# Patient Record
Sex: Female | Born: 1965 | Race: Black or African American | Hispanic: No | Marital: Married | Smoking: Never smoker
Health system: Southern US, Community
[De-identification: ages and names within clinical notes are randomized; demographics above are authoritative.]

## PROBLEM LIST (undated history)

## (undated) DIAGNOSIS — M1990 Unspecified osteoarthritis, unspecified site: Secondary | ICD-10-CM

## (undated) DIAGNOSIS — I499 Cardiac arrhythmia, unspecified: Secondary | ICD-10-CM

## (undated) DIAGNOSIS — K589 Irritable bowel syndrome without diarrhea: Secondary | ICD-10-CM

## (undated) DIAGNOSIS — R569 Unspecified convulsions: Secondary | ICD-10-CM

## (undated) DIAGNOSIS — F419 Anxiety disorder, unspecified: Secondary | ICD-10-CM

## (undated) DIAGNOSIS — K219 Gastro-esophageal reflux disease without esophagitis: Secondary | ICD-10-CM

## (undated) DIAGNOSIS — G43909 Migraine, unspecified, not intractable, without status migrainosus: Secondary | ICD-10-CM

## (undated) DIAGNOSIS — F32A Depression, unspecified: Secondary | ICD-10-CM

## (undated) DIAGNOSIS — M797 Fibromyalgia: Secondary | ICD-10-CM

## (undated) DIAGNOSIS — F329 Major depressive disorder, single episode, unspecified: Secondary | ICD-10-CM

## (undated) DIAGNOSIS — F509 Eating disorder, unspecified: Secondary | ICD-10-CM

## (undated) HISTORY — DX: Irritable bowel syndrome, unspecified: K58.9

## (undated) HISTORY — DX: Eating disorder, unspecified: F50.9

## (undated) HISTORY — DX: Fibromyalgia: M79.7

## (undated) HISTORY — DX: Cardiac arrhythmia, unspecified: I49.9

## (undated) HISTORY — DX: Unspecified osteoarthritis, unspecified site: M19.90

## (undated) HISTORY — DX: Major depressive disorder, single episode, unspecified: F32.9

## (undated) HISTORY — DX: Anxiety disorder, unspecified: F41.9

## (undated) HISTORY — DX: Depression, unspecified: F32.A

## (undated) HISTORY — DX: Gastro-esophageal reflux disease without esophagitis: K21.9

## (undated) HISTORY — DX: Migraine, unspecified, not intractable, without status migrainosus: G43.909

## (undated) HISTORY — DX: Unspecified convulsions: R56.9

---

## 1998-01-16 ENCOUNTER — Ambulatory Visit (HOSPITAL_COMMUNITY): Admission: RE | Admit: 1998-01-16 | Discharge: 1998-01-16 | Payer: Self-pay | Admitting: Obstetrics & Gynecology

## 1998-03-23 ENCOUNTER — Ambulatory Visit (HOSPITAL_COMMUNITY): Admission: RE | Admit: 1998-03-23 | Discharge: 1998-03-23 | Payer: Self-pay | Admitting: Obstetrics and Gynecology

## 1998-06-24 ENCOUNTER — Inpatient Hospital Stay (HOSPITAL_COMMUNITY): Admission: AD | Admit: 1998-06-24 | Discharge: 1998-06-27 | Payer: Self-pay | Admitting: Obstetrics and Gynecology

## 1998-10-03 ENCOUNTER — Emergency Department (HOSPITAL_COMMUNITY): Admission: EM | Admit: 1998-10-03 | Discharge: 1998-10-03 | Payer: Self-pay | Admitting: Emergency Medicine

## 1998-10-03 ENCOUNTER — Encounter: Payer: Self-pay | Admitting: Emergency Medicine

## 1998-12-05 ENCOUNTER — Other Ambulatory Visit: Admission: RE | Admit: 1998-12-05 | Discharge: 1998-12-05 | Payer: Self-pay | Admitting: *Deleted

## 1999-04-01 ENCOUNTER — Other Ambulatory Visit: Admission: RE | Admit: 1999-04-01 | Discharge: 1999-04-01 | Payer: Self-pay | Admitting: *Deleted

## 1999-07-10 ENCOUNTER — Other Ambulatory Visit: Admission: RE | Admit: 1999-07-10 | Discharge: 1999-07-10 | Payer: Self-pay | Admitting: *Deleted

## 1999-07-12 ENCOUNTER — Other Ambulatory Visit: Admission: RE | Admit: 1999-07-12 | Discharge: 1999-07-12 | Payer: Self-pay | Admitting: *Deleted

## 2000-02-13 ENCOUNTER — Other Ambulatory Visit: Admission: RE | Admit: 2000-02-13 | Discharge: 2000-02-13 | Payer: Self-pay | Admitting: *Deleted

## 2000-12-21 ENCOUNTER — Other Ambulatory Visit: Admission: RE | Admit: 2000-12-21 | Discharge: 2000-12-21 | Payer: Self-pay | Admitting: *Deleted

## 2001-09-24 ENCOUNTER — Other Ambulatory Visit: Admission: RE | Admit: 2001-09-24 | Discharge: 2001-09-24 | Payer: Self-pay | Admitting: *Deleted

## 2003-05-29 ENCOUNTER — Encounter: Admission: RE | Admit: 2003-05-29 | Discharge: 2003-05-29 | Payer: Self-pay | Admitting: Chiropractic Medicine

## 2003-05-29 ENCOUNTER — Encounter: Payer: Self-pay | Admitting: Chiropractic Medicine

## 2004-08-13 ENCOUNTER — Emergency Department (HOSPITAL_COMMUNITY): Admission: EM | Admit: 2004-08-13 | Discharge: 2004-08-13 | Payer: Self-pay | Admitting: Emergency Medicine

## 2005-05-29 ENCOUNTER — Other Ambulatory Visit: Admission: RE | Admit: 2005-05-29 | Discharge: 2005-05-29 | Payer: Self-pay | Admitting: Obstetrics and Gynecology

## 2005-06-03 ENCOUNTER — Ambulatory Visit (HOSPITAL_COMMUNITY): Admission: RE | Admit: 2005-06-03 | Discharge: 2005-06-03 | Payer: Self-pay | Admitting: Obstetrics and Gynecology

## 2006-05-28 ENCOUNTER — Encounter: Admission: RE | Admit: 2006-05-28 | Discharge: 2006-05-28 | Payer: Self-pay | Admitting: Internal Medicine

## 2006-06-09 ENCOUNTER — Ambulatory Visit (HOSPITAL_COMMUNITY): Admission: RE | Admit: 2006-06-09 | Discharge: 2006-06-09 | Payer: Self-pay | Admitting: Obstetrics and Gynecology

## 2006-08-28 ENCOUNTER — Emergency Department (HOSPITAL_COMMUNITY): Admission: EM | Admit: 2006-08-28 | Discharge: 2006-08-28 | Payer: Self-pay | Admitting: Emergency Medicine

## 2006-08-29 ENCOUNTER — Ambulatory Visit: Payer: Self-pay | Admitting: *Deleted

## 2006-08-29 ENCOUNTER — Inpatient Hospital Stay (HOSPITAL_COMMUNITY): Admission: AD | Admit: 2006-08-29 | Discharge: 2006-08-31 | Payer: Self-pay | Admitting: *Deleted

## 2006-12-03 ENCOUNTER — Other Ambulatory Visit: Admission: RE | Admit: 2006-12-03 | Discharge: 2006-12-03 | Payer: Self-pay | Admitting: Obstetrics and Gynecology

## 2007-01-26 ENCOUNTER — Ambulatory Visit (HOSPITAL_COMMUNITY): Admission: RE | Admit: 2007-01-26 | Discharge: 2007-01-26 | Payer: Self-pay | Admitting: Obstetrics and Gynecology

## 2007-03-25 ENCOUNTER — Ambulatory Visit (HOSPITAL_COMMUNITY): Admission: RE | Admit: 2007-03-25 | Discharge: 2007-03-25 | Payer: Self-pay | Admitting: Obstetrics and Gynecology

## 2007-07-01 ENCOUNTER — Inpatient Hospital Stay (HOSPITAL_COMMUNITY): Admission: RE | Admit: 2007-07-01 | Discharge: 2007-07-03 | Payer: Self-pay | Admitting: Obstetrics and Gynecology

## 2007-12-06 ENCOUNTER — Other Ambulatory Visit: Admission: RE | Admit: 2007-12-06 | Discharge: 2007-12-06 | Payer: Self-pay | Admitting: Obstetrics and Gynecology

## 2009-03-07 ENCOUNTER — Encounter: Admission: RE | Admit: 2009-03-07 | Discharge: 2009-03-07 | Payer: Self-pay | Admitting: Family Medicine

## 2009-07-04 ENCOUNTER — Other Ambulatory Visit: Admission: RE | Admit: 2009-07-04 | Discharge: 2009-07-04 | Payer: Self-pay | Admitting: Family Medicine

## 2009-07-05 ENCOUNTER — Encounter: Admission: RE | Admit: 2009-07-05 | Discharge: 2009-07-05 | Payer: Self-pay | Admitting: Family Medicine

## 2010-07-15 ENCOUNTER — Ambulatory Visit (HOSPITAL_COMMUNITY): Admission: RE | Admit: 2010-07-15 | Discharge: 2010-07-15 | Payer: Self-pay | Admitting: Family Medicine

## 2010-07-15 ENCOUNTER — Other Ambulatory Visit: Admission: RE | Admit: 2010-07-15 | Discharge: 2010-07-15 | Payer: Self-pay | Admitting: Family Medicine

## 2010-12-29 ENCOUNTER — Encounter: Payer: Self-pay | Admitting: Obstetrics and Gynecology

## 2011-02-27 ENCOUNTER — Emergency Department (HOSPITAL_COMMUNITY): Payer: BC Managed Care – PPO

## 2011-02-27 ENCOUNTER — Emergency Department (HOSPITAL_COMMUNITY)
Admission: EM | Admit: 2011-02-27 | Discharge: 2011-02-28 | Disposition: A | Discharge status: Home / Self Care | Payer: BC Managed Care – PPO | Attending: Emergency Medicine | Admitting: Emergency Medicine

## 2011-02-27 DIAGNOSIS — R071 Chest pain on breathing: Secondary | ICD-10-CM | POA: Insufficient documentation

## 2011-02-27 DIAGNOSIS — M542 Cervicalgia: Secondary | ICD-10-CM | POA: Insufficient documentation

## 2011-02-27 DIAGNOSIS — G8929 Other chronic pain: Secondary | ICD-10-CM | POA: Insufficient documentation

## 2011-02-27 DIAGNOSIS — M549 Dorsalgia, unspecified: Secondary | ICD-10-CM | POA: Insufficient documentation

## 2011-02-27 LAB — BASIC METABOLIC PANEL
BUN: 11 mg/dL (ref 6–23)
CO2: 28 mEq/L (ref 19–32)
Chloride: 105 mEq/L (ref 96–112)
Creatinine, Ser: 0.67 mg/dL (ref 0.4–1.2)
GFR calc non Af Amer: >60 mL/min (ref >60)
Glucose, Bld: 112 mg/dL — ABNORMAL HIGH (ref 70–99)
Potassium: 3.8 mEq/L (ref 3.5–5.1)
Sodium: 137 mEq/L (ref 135–145)

## 2011-02-27 LAB — POCT CARDIAC MARKERS

## 2011-04-22 NOTE — H&P (Signed)
Suzanne Singh, [name]                ACCOUNT NO.:  [id number]   MEDICAL RECORD NO.:  [id number]          PATIENT TYPE:  OUT   LOCATION:  ULT                           [facility]ILITY[facility]  [facility]   PHYSICIAN:  Charles A. Delcambre, MDDATE OF BIRTH:  09-24-1966   DATE OF ADMISSION:  DATE OF DISCHARGE:                              HISTORY & PHYSICAL   PREADMISSION HISTORY AND PHYSICAL   This patient is to be admitted for induction of labor at 40 weeks and 6  days as I will be out of town the next three days and that would make  her 10 days overdue, which I would recommend, but she wishes to proceed  with induction on this day and I think this is reasonable.  She gives  informed consent.  She is a 45 year old, gravida 2, para 1-0-0-1, [facility]  June 25, 2007.  Pregnancy complicated by the advanced maternal age,  Rubella nonimmune, anemia, hyperemesis, continued anemia, fibromyalgia,  and chronic fatigue.  She gives informed consent for induction.   PAST MEDICAL HISTORY:  1. Fibromyalgia.  2. Chronic fatigue.   PAST SURGICAL HISTORY:  SVD x1.   PRESENT MEDICATIONS:  Nexium 40 mg per day for GERD associated with the  pregnancy, and prenatal vitamins with iron.   ALLERGIES:  No known drug allergies.   SOCIAL HISTORY:  No tobacco, ethanol, or drug use, or STD exposure in  the past.  She is married and in a monogamous relationship with her  husband.   FAMILY HISTORY:  Father deceased of metastatic lung cancer of unknown  primary with hypertension and diabetes as well, mother with  hypertension, four siblings good health, one sister fair health, not  stated what her problem is.   REVIEW OF SYSTEMS:  Denies fever or chills, rashes, lesions, headaches,  dizziness, seasonal allergies, chest pain, shortness of breath,  wheezing, diarrhea, constipation, bleeding, melena, hematochezia,  urgency, frequency, dysuria, incontinence other than that associated  with the pregnancy, no hematuria, she does have  galactorrhea developing,  no emotional changes, but a somewhat flat affect as I have known her in  the past as well.   PHYSICAL EXAMINATION:  GENERAL:  Alert and oriented x3.  VITAL SIGNS:  Weight 211, blood pressure 100/70, respirations 18, pulse  90.  Fundal height is 37 today at 39 weeks and 4 days.  HEENT:  Grossly within normal limits.  NECK:  Supple without thyromegaly or adenopathy.  LUNGS:  Clear bilaterally.  HEART:  Regular rate and rhythm, 2/6 systolic ejection murmur at the  left sternal border.  BREASTS:  No masses, tenderness, discharge, skin or nipple changes  bilaterally.  ABDOMEN:  Gravid as noted above.  PELVIC EXAM:  Cervix posterior, 2 to 3 cm dilated, 50% effaced, soft,  minus one, vertex intact.  EXTREMITIES:  Mild edema bilaterally after just getting off work.  She  had significant edema when she was up on her feet working, but this is  quite a bit resolved.   ASSESSMENT:  1. Intrauterine pregnancy at 40 weeks, 6 days.  2. Advanced maternal age.  3. Fibromyalgia.  4. Chronic fatigue.  5. History of anemia, hemoglobin 11.5 today.  6. History of seizure prior to this pregnancy felt to be caused by a      number of medications she was on.  She had a complete workup at      [facility], which was negative, and was placed on no medications.   PLAN:  1. Pitocin/AROM induction as scheduled at 40 weeks and 6 days.  2. Pregnancy:  She is improving and will follow up as directed.   PRENATAL LABORATORY DATA:  Blood type O-positive, antibody screen  negative, sickle cell trait negative, VDRL nonreactive, Rubella titer  nonimmune, hepatitis B surface antigen negative, HIV negative, TSH  normal, Pap negative, GC negative, Chlamydia negative in December of  2007, one-hour Glucola 111.  She declined a quad screen or a first  trimester screen.  Hemoglobin 9.4 at that time, got onto the iron.  She  declined an RPR, GC, Chlamydia, or HIV testing further in the pregnancy,  and  Group B Strep was negative at 36 weeks.      Charles A. Sydnee Cabal, MD  Electronically Signed     CAD/MEDQ  D:  06/22/2007  T:  06/22/2007  Job:  [id number]
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
## 2011-04-25 NOTE — Discharge Summary (Signed)
[name], Singh NO.:  [id number]   MEDICAL RECORD NO.:  [id number]          PATIENT TYPE:  IPS   LOCATION:  0301                          [facility]ILITY[facility]   PHYSICIAN:  Jasmine Pang, M.D. DATE OF BIRTH:  1966-09-26   DATE OF ADMISSION:  08/29/2006  DATE OF DISCHARGE:  08/31/2006                                 DISCHARGE SUMMARY   IDENTIFYING INFORMATION:  This is a 45 year old married African American  female who was admitted on a voluntary basis on August 29, 2006.   HISTORY OF PRESENT ILLNESS:  The patient presented to the [facility] ED in  the company of her husband and sister.  This was a little after midnight  Saturday.  She stated that she was there for exhaustion.  She had not slept  in five days.  She was alert but her orientation was questionable.  The  family stated she stopped taking her medications.  She denied any suicidal  ideation.  She was noted to have 7-10 wbc in her urine.  She was treated  with IV Cipro.  Her glucose was elevated to 149.  Her urine drug screen was  negative.  Her alcohol level was less than 5.  Her TSH was pending.  While  she was in the emergency room, she was noted to be twitching.  This was felt  to be representative of anxiety and pseudoseizures.  She was treated with IV  Ativan.  She had a complete workup including a CT scan and everything was  negative.  The patient has no prior inpatient treatment.  She has been  treated for a number of years now as an outpatient basis through [facility].  Dr. Audrie Lia was her physician but he recently retired.  Most  currently, she has been on Prozac 60 mg p.o. q.d. which she stopped taking  several days ago because she felt this was making her feel worse.  She  admits to taking a couple glasses of wine every now and then and  occasionally uses Tylenol PM to help sleep but there is no drug abuse.  She  also has fibromyalgia and has been on prednisone in the past for this  but  not for the last several months.   PHYSICAL EXAMINATION:  As already stated, the patient was found to have a  UTI.  Her physical exam was done in the [facility] ED and otherwise was  normal.   LABORATORY DATA:  See history of present illness.  These were done in the  [facility] ED.   HOSPITAL COURSE:  Upon admission, the patient was started on Ambien 10 mg  p.o. q.h.s. p.r.n., Zyprexa Zydis 5 mg p.o. q.6h. p.r.n., Ativan 2 mg p.o.  or IM q.6h. p.r.n. anxiety and Cipro 500 mg p.o. b.i.d. x5 more doses due to  her UTI.  The patient tolerated these medications well with no significant  side effects.  Her Prozac was not restarted because the patient did not want  to be treated with this anymore.  She felt this had possibly caused  her  seizure.  She was started on August 30, 2006 on Remeron 15 mg p.o. q.h.s.  The patient did well on the unit and was able to participate in unit  therapeutic groups and activities.  She was not suicidal or homicidal.  She  still felt the seizure was not due to psychiatric reasons but due to her  Prozac.  Her mental status exam had improved markedly.  The patient was less  depressed.  Her affect was wider range.  She was not suicidal or homicidal.  No self-injurious behavior.  No auditory or visual hallucinations.  No  paranoia or delusions.  Thoughts were logical and goal-directed.  Thought  content no predominant theme.  Cognitive exam was grossly within normal  limits.  The patient was very bright Engineer, site.   DISCHARGE DIAGNOSES:  AXIS I:  Mood disorder not otherwise specified.  AXIS II:  Deferred.  AXIS III:  Pseudoseizures, urinary tract infection, fibromyalgia,  questionable history for bleeding and migraines.  AXIS IV:  Moderate (stressful job).  AXIS V:  GAF upon discharge 58; GAF upon admission 37; GAF highest past year  75-80.   ACTIVITY/DIET:  There are no specific activity level or dietary  restrictions.   DISCHARGE MEDICATIONS:   Remeron 15 mg p.o. q.h.s.   POST-HOSPITAL CARE PLANS:  The patient will see Hulda Marin on  Thursday September 03, 2006 at 2 p.m. for therapy.  She will see Dr. Lolly Mustache  on Wednesday, October 07, 2006 at 9 a.m.      Jasmine Pang, M.D.  Electronically Signed     BHS/MEDQ  D:  08/31/2006  T:  09/01/2006  Job:  [id number]

## 2011-06-09 ENCOUNTER — Other Ambulatory Visit (HOSPITAL_COMMUNITY): Payer: Self-pay | Admitting: Family Medicine

## 2011-06-09 DIAGNOSIS — Z1231 Encounter for screening mammogram for malignant neoplasm of breast: Secondary | ICD-10-CM

## 2011-07-17 ENCOUNTER — Ambulatory Visit (HOSPITAL_COMMUNITY)
Admission: RE | Admit: 2011-07-17 | Discharge: 2011-07-17 | Disposition: A | Discharge status: Home / Self Care | Payer: BC Managed Care – PPO | Source: Ambulatory Visit | Attending: Family Medicine | Admitting: Family Medicine

## 2011-07-17 DIAGNOSIS — Z1231 Encounter for screening mammogram for malignant neoplasm of breast: Secondary | ICD-10-CM | POA: Insufficient documentation

## 2011-07-21 ENCOUNTER — Other Ambulatory Visit (HOSPITAL_COMMUNITY)
Admission: RE | Admit: 2011-07-21 | Discharge: 2011-07-21 | Disposition: A | Discharge status: Home / Self Care | Payer: BC Managed Care – PPO | Source: Ambulatory Visit | Attending: Family Medicine | Admitting: Family Medicine

## 2011-07-21 ENCOUNTER — Other Ambulatory Visit: Payer: Self-pay | Admitting: Family Medicine

## 2011-07-21 DIAGNOSIS — Z01419 Encounter for gynecological examination (general) (routine) without abnormal findings: Secondary | ICD-10-CM | POA: Insufficient documentation

## 2011-09-22 LAB — CBC
HCT: 31.8 — ABNORMAL LOW
Hemoglobin: 10.6 — ABNORMAL LOW
MCHC: 33.4
MCV: 90.2
RBC: 3.03 — ABNORMAL LOW
RBC: 3.59 — ABNORMAL LOW

## 2012-05-19 ENCOUNTER — Ambulatory Visit: Payer: BC Managed Care – PPO | Admitting: Family Medicine

## 2012-06-09 ENCOUNTER — Other Ambulatory Visit: Payer: Self-pay | Admitting: Family Medicine

## 2012-06-09 DIAGNOSIS — Z1231 Encounter for screening mammogram for malignant neoplasm of breast: Secondary | ICD-10-CM

## 2012-07-19 ENCOUNTER — Ambulatory Visit (HOSPITAL_COMMUNITY)
Admission: RE | Admit: 2012-07-19 | Discharge: 2012-07-19 | Disposition: A | Discharge status: Home / Self Care | Payer: BC Managed Care – PPO | Source: Ambulatory Visit | Attending: Family Medicine | Admitting: Family Medicine

## 2012-07-19 DIAGNOSIS — Z1231 Encounter for screening mammogram for malignant neoplasm of breast: Secondary | ICD-10-CM

## 2012-07-22 ENCOUNTER — Ambulatory Visit (INDEPENDENT_AMBULATORY_CARE_PROVIDER_SITE_OTHER): Payer: BC Managed Care – PPO | Admitting: Family Medicine

## 2012-07-22 ENCOUNTER — Encounter: Payer: Self-pay | Admitting: *Deleted

## 2012-07-22 ENCOUNTER — Encounter: Payer: Self-pay | Admitting: Family Medicine

## 2012-07-22 ENCOUNTER — Other Ambulatory Visit (HOSPITAL_COMMUNITY)
Admission: RE | Admit: 2012-07-22 | Discharge: 2012-07-22 | Disposition: A | Discharge status: Home / Self Care | Payer: BC Managed Care – PPO | Source: Ambulatory Visit | Attending: Family Medicine | Admitting: Family Medicine

## 2012-07-22 VITALS — BP 108/78 | HR 85 | Temp 98.3°F | Ht 68.5 in | Wt 179.0 lb

## 2012-07-22 DIAGNOSIS — F418 Other specified anxiety disorders: Secondary | ICD-10-CM | POA: Insufficient documentation

## 2012-07-22 DIAGNOSIS — K219 Gastro-esophageal reflux disease without esophagitis: Secondary | ICD-10-CM

## 2012-07-22 DIAGNOSIS — Z124 Encounter for screening for malignant neoplasm of cervix: Secondary | ICD-10-CM | POA: Insufficient documentation

## 2012-07-22 DIAGNOSIS — IMO0001 Reserved for inherently not codable concepts without codable children: Secondary | ICD-10-CM

## 2012-07-22 DIAGNOSIS — Z01419 Encounter for gynecological examination (general) (routine) without abnormal findings: Secondary | ICD-10-CM | POA: Insufficient documentation

## 2012-07-22 DIAGNOSIS — Z0000 Encounter for general adult medical examination without abnormal findings: Secondary | ICD-10-CM | POA: Insufficient documentation

## 2012-07-22 DIAGNOSIS — F509 Eating disorder, unspecified: Secondary | ICD-10-CM | POA: Insufficient documentation

## 2012-07-22 DIAGNOSIS — F419 Anxiety disorder, unspecified: Secondary | ICD-10-CM

## 2012-07-22 DIAGNOSIS — M797 Fibromyalgia: Secondary | ICD-10-CM | POA: Insufficient documentation

## 2012-07-22 DIAGNOSIS — F341 Dysthymic disorder: Secondary | ICD-10-CM

## 2012-07-22 LAB — CBC WITH DIFFERENTIAL/PLATELET
Eosinophils Relative: 1.7 % (ref 0.0–5.0)
HCT: 36.2 % (ref 36.0–46.0)
Lymphocytes Relative: 40.6 % (ref 12.0–46.0)
Lymphs Abs: 1.3 10*3/uL (ref 0.7–4.0)
Monocytes Absolute: 0.4 10*3/uL (ref 0.1–1.0)
Neutro Abs: 1.5 10*3/uL (ref 1.4–7.7)
Neutrophils Relative %: 45 % (ref 43.0–77.0)

## 2012-07-22 LAB — BASIC METABOLIC PANEL
CO2: 26 mEq/L (ref 19–32)
Calcium: 9 mg/dL (ref 8.4–10.5)
Chloride: 107 mEq/L (ref 96–112)
Creatinine, Ser: 0.5 mg/dL (ref 0.4–1.2)
GFR: 157.33 mL/min (ref >60.00)
Glucose, Bld: 84 mg/dL (ref 70–99)
Potassium: 3.9 mEq/L (ref 3.5–5.1)

## 2012-07-22 LAB — HEPATIC FUNCTION PANEL
ALT: 14 U/L (ref 0–35)
AST: 22 U/L (ref 0–37)
Total Bilirubin: 0.4 mg/dL (ref 0.3–1.2)
Total Protein: 7.3 g/dL (ref 6.0–8.3)

## 2012-07-22 LAB — LIPID PANEL
LDL Cholesterol: 88 mg/dL (ref 0–99)
Total CHOL/HDL Ratio: 2
VLDL: 10.8 mg/dL (ref 0.0–40.0)

## 2012-07-22 MED ORDER — OMEPRAZOLE 40 MG PO CPDR: 40.0000 mg | DELAYED_RELEASE_CAPSULE | Freq: Every day | ORAL | Status: DC

## 2012-07-22 NOTE — Patient Instructions (Addendum)
We'll notify you of your lab results Start the Omeprazole daily for the reflux Continue therapy- call if you have increased difficulty w/ depression or anxiety Start Yoga or water aerobics as both exercise and stress relief Call with any questions or concerns Welcome!  We're glad to have you! Hang in there!

## 2012-07-22 NOTE — Progress Notes (Signed)
  Subjective:    Patient ID: Suzanne Singh, female    DOB: 1966-07-18, 46 y.o.   MRN: [id number]  HPI New to establish.  Previous MD- Wynelle Link.  UTD on mammogram.  Anxiety/depression- chronic problem, has been told she has fibromyalgia and is currently seeing Integrative therapies.  Getting Acupuncture, has been in PT several times w/out relief.  In counseling.  Has hx of eating disorder (anorexia/bulemia) and fears recurrent sxs due to recent separation.  Pt doesn't 'see much difference' since starting the Zoloft.  Is taking 1.5 tabs of Klonopin nightly- she is able to fall asleep but difficulty staying asleep.   Review of Systems Patient reports no vision/ hearing changes, adenopathy, fever, weight change,  persistant/recurrent hoarseness , swallowing issues, chest pain, palpitations, edema, persistant/recurrent cough, hemoptysis, dyspnea (rest/exertional/paroxysmal nocturnal), gastrointestinal bleeding (melena, rectal bleeding), abdominal pain, bowel changes, GU symptoms (dysuria, hematuria, incontinence), Gyn symptoms (abnormal  bleeding, pain),  syncope, focal weakness, memory loss, numbness & tingling, skin/hair/nail changes, abnormal bruising or bleeding.   + GERD    Objective:   Physical Exam  General Appearance:    Alert, cooperative, no distress, appears stated age  Head:    Normocephalic, without obvious abnormality, atraumatic  Eyes:    PERRL, conjunctiva/corneas clear, EOM's intact, fundi    benign, both eyes  Ears:    Normal TM's and external ear canals, both ears  Nose:   Nares normal, septum midline, mucosa normal, no drainage    or sinus tenderness  Throat:   Lips, mucosa, and tongue normal; teeth and gums normal  Neck:   Supple, symmetrical, trachea midline, no adenopathy;    Thyroid: no enlargement/tenderness/nodules  Back:     Symmetric, no curvature, ROM normal, no CVA tenderness  Lungs:     Clear to auscultation bilaterally, respirations unlabored  Chest Wall:    No  tenderness or deformity   Heart:    Regular rate and rhythm, S1 and S2 normal, no murmur, rub   or gallop  Breast Exam:    No tenderness, masses, or nipple abnormality  Abdomen:     Soft, non-tender, bowel sounds active all four quadrants,    no masses, no organomegaly  Genitalia:    External genitalia normal, cervix normal in appearance, no CMT, uterus in normal size and position, adnexa w/out mass or tenderness, mucosa pink and moist, no lesions or discharge present  Rectal:    Normal external appearance  Extremities:   Extremities normal, atraumatic, no cyanosis or edema  Pulses:   2+ and symmetric all extremities  Skin:   Skin color, texture, turgor normal, no rashes or lesions  Lymph nodes:   Cervical, supraclavicular, and axillary nodes normal  Neurologic:   CNII-XII intact, normal strength, sensation and reflexes    throughout          Assessment & Plan:

## 2012-07-26 ENCOUNTER — Encounter: Payer: Self-pay | Admitting: *Deleted

## 2012-07-26 LAB — VITAMIN D 1,25 DIHYDROXY
Vitamin D2 1, 25 (OH)2: <8 pg/mL
Vitamin D3 1, 25 (OH)2: 79 pg/mL

## 2012-07-27 ENCOUNTER — Encounter: Payer: Self-pay | Admitting: *Deleted

## 2012-08-01 NOTE — Assessment & Plan Note (Signed)
Pt's PE WNL.  UTD on mammo.  Check labs.  Anticipatory guidance provided.  

## 2012-08-01 NOTE — Assessment & Plan Note (Signed)
Hx of this.  Pt fears recurrence w/ recent separation.  In counseling.  Will follow.

## 2012-08-01 NOTE — Assessment & Plan Note (Signed)
New to provider, chronic for pt.  sxs do not seem well controlled.  In therapy.  On Zoloft.  Discussed seeing psychiatrist vs adjusting meds.  Pt will think about this and get back to me.  Will follow closely.

## 2012-08-01 NOTE — Assessment & Plan Note (Signed)
New.  Pt in difficult cycle of pain, depression, poor sleep.  Stressed importance of regular exercise, healthy eating habits, adequate sleep, and control of depression/anxiety.  Pt in agreement.  Will follow.

## 2012-08-01 NOTE — Assessment & Plan Note (Signed)
Pap collected. 

## 2012-08-01 NOTE — Assessment & Plan Note (Signed)
New.  Pt's sxs not well controlled.  Start PPI.

## 2012-08-18 ENCOUNTER — Ambulatory Visit: Payer: BC Managed Care – PPO | Admitting: Family Medicine

## 2012-10-08 ENCOUNTER — Ambulatory Visit: Payer: BC Managed Care – PPO | Admitting: Family Medicine

## 2012-10-27 ENCOUNTER — Encounter: Payer: Self-pay | Admitting: Family Medicine

## 2012-10-27 ENCOUNTER — Telehealth: Payer: Self-pay | Admitting: Family Medicine

## 2012-10-27 NOTE — Telephone Encounter (Signed)
1-time waiver for No show fee DOS 11.1.13 OK NF

## 2012-10-27 NOTE — Telephone Encounter (Signed)
Message copied by Verner Chol on Wed Oct 27, 2012 11:00 AM ------      Message from: Elwin Sleight      Created: Wed Oct 27, 2012 10:59 AM      Regarding: RE: no show fee      Contact: [phone]       Since no previous problems, please do the 1 time waiver.  Thanks, Harriett Sine      ----- Message -----         From: Theodis Sato McDaniels         Sent: 10/25/2012   3:52 PM           To: Elwin Sleight      Subject: no show fee                                              Pt called stated she cancelled her appt but was no showed anyway. Spoke with Scheduler and she did not cancel the one she was no showed for she did cancel the other one. Can I give pt a 1-time waiver, with notice all future appts are subject to no show fee

## 2012-11-02 ENCOUNTER — Telehealth: Payer: Self-pay | Admitting: Family Medicine

## 2012-11-02 NOTE — Telephone Encounter (Signed)
Called pt 857am 11.26.13 lmom

## 2012-11-02 NOTE — Telephone Encounter (Signed)
Message copied by Verner Chol on Tue Nov 02, 2012  8:56 AM ------      Message from: Marshell Garfinkel      Created: Fri Oct 29, 2012  2:47 PM      Contact: [phone]       Patient states someone was to call her this week regarding her no-show fee. She can be reached on Monday after 3 pm at [phone].

## 2012-12-06 ENCOUNTER — Encounter: Payer: Self-pay | Admitting: Internal Medicine

## 2012-12-06 ENCOUNTER — Encounter: Payer: Self-pay | Admitting: Family Medicine

## 2012-12-06 ENCOUNTER — Ambulatory Visit (INDEPENDENT_AMBULATORY_CARE_PROVIDER_SITE_OTHER): Payer: BC Managed Care – PPO | Admitting: Family Medicine

## 2012-12-06 VITALS — BP 120/80 | HR 71 | Temp 98.2°F | Ht 68.5 in | Wt 186.2 lb

## 2012-12-06 DIAGNOSIS — K589 Irritable bowel syndrome without diarrhea: Secondary | ICD-10-CM

## 2012-12-06 DIAGNOSIS — F341 Dysthymic disorder: Secondary | ICD-10-CM

## 2012-12-06 DIAGNOSIS — R1013 Epigastric pain: Secondary | ICD-10-CM | POA: Insufficient documentation

## 2012-12-06 DIAGNOSIS — Z780 Asymptomatic menopausal state: Secondary | ICD-10-CM | POA: Insufficient documentation

## 2012-12-06 DIAGNOSIS — F419 Anxiety disorder, unspecified: Secondary | ICD-10-CM

## 2012-12-06 DIAGNOSIS — N951 Menopausal and female climacteric states: Secondary | ICD-10-CM | POA: Insufficient documentation

## 2012-12-06 DIAGNOSIS — Z800 Family history of malignant neoplasm of digestive organs: Secondary | ICD-10-CM

## 2012-12-06 DIAGNOSIS — R5383 Other fatigue: Secondary | ICD-10-CM | POA: Insufficient documentation

## 2012-12-06 LAB — BASIC METABOLIC PANEL
CO2: 27 mEq/L (ref 19–32)
Chloride: 104 mEq/L (ref 96–112)
Creatinine, Ser: 0.7 mg/dL (ref 0.4–1.2)
Potassium: 3.9 mEq/L (ref 3.5–5.1)

## 2012-12-06 LAB — HEPATIC FUNCTION PANEL
ALT: 17 U/L (ref 0–35)
AST: 20 U/L (ref 0–37)
Alkaline Phosphatase: 70 U/L (ref 39–117)
Bilirubin, Direct: 0 mg/dL (ref 0.0–0.3)
Total Protein: 7.7 g/dL (ref 6.0–8.3)

## 2012-12-06 LAB — AMYLASE: Amylase: 82 U/L (ref 27–131)

## 2012-12-06 MED ORDER — DICYCLOMINE HCL 20 MG PO TABS: 20.0000 mg | ORAL_TABLET | Freq: Four times a day (QID) | ORAL | Status: DC

## 2012-12-06 NOTE — Patient Instructions (Addendum)
We'll call you with your GI appt Start the Bentyl as needed for abd pain/spasm We'll notify you of your lab results Make sure you are taking the Omeprazole daily to decrease acid Consider adding Black Cohosh or Estruven for the hot flashes Please call if symptoms change or fail to improve Call with any questions or concerns Hang in there! Happy New Year!!!

## 2012-12-06 NOTE — Progress Notes (Signed)
  Subjective:    Patient ID: Suzanne Singh, female    DOB: 1966/04/08, 46 y.o.   MRN: [id number]  HPI abd pain- 'churning, awful gas'.  Alternating diarrhea and constipation.  + family hx of colon cancer- father.  Pt feels sxs are more constipation than diarrhea.  + GERD w/ stress.  Will take prilosec prn.  Pain is epigastric.  + nausea, vomiting- last vomited 2 days ago.  Vomiting occurs after eating.  Pain is worse w/ food.  Increased stress recently.  Night sweats- occuring 'every other night'.  sxs started 3 months ago.  No weight loss, no cough.  Pt questioning if it is related to Zoloft and/or menopause.  Sweats are not soaking.  Periods are becoming irregular in duration of menses.  Pt having hot flashes during the day.  + palpitations, fatigue, achy- hx of anxiety and fibromyalgia.   Review of Systems For ROS see HPI     Objective:   Physical Exam  Vitals reviewed. Constitutional: She is oriented to person, place, and time. She appears well-developed and well-nourished. No distress.  HENT:  Head: Normocephalic and atraumatic.  Eyes: Conjunctivae normal and EOM are normal. Pupils are equal, round, and reactive to light.  Neck: Normal range of motion. Neck supple. No thyromegaly present.  Cardiovascular: Normal rate, regular rhythm, normal heart sounds and intact distal pulses.   No murmur heard. Pulmonary/Chest: Effort normal and breath sounds normal. No respiratory distress.  Abdominal: Soft. She exhibits no distension. There is no tenderness. There is no rebound and no guarding.  Musculoskeletal: She exhibits no edema.  Lymphadenopathy:    She has no cervical adenopathy.  Neurological: She is alert and oriented to person, place, and time.  Skin: Skin is warm and dry.  Psychiatric: She has a normal mood and affect. Her behavior is normal.          Assessment & Plan:

## 2012-12-07 LAB — CBC WITH DIFFERENTIAL/PLATELET
Basophils Absolute: 0 10*3/uL (ref 0.0–0.1)
Hemoglobin: 12.1 g/dL (ref 12.0–15.0)
Lymphocytes Relative: 34.6 % (ref 12.0–46.0)
Monocytes Relative: 11.4 % (ref 3.0–12.0)
Neutro Abs: 2.3 10*3/uL (ref 1.4–7.7)
RBC: 4.14 Mil/uL (ref 3.87–5.11)
RDW: 13.6 % (ref 11.5–14.6)

## 2012-12-08 HISTORY — PX: COLONOSCOPY: SHX174

## 2012-12-08 NOTE — Assessment & Plan Note (Signed)
New.  Suspect this is the cause of pt's alternating diarrhea and constipation.  Will start bentyl prn for abd pain.  Refer to GI.  Reviewed supportive care and red flags that should prompt return.  Pt expressed understanding and is in agreement w/ plan.

## 2012-12-08 NOTE — Assessment & Plan Note (Signed)
Deteriorated.  Pt on SSRI but still extremely anxious.  Encouraged counseling.  Suspect this is contributing to pt's IBS.  Will follow.

## 2012-12-08 NOTE — Assessment & Plan Note (Signed)
New.  Already on SSRI.  Add black cohosh or OTC estroven.  Will follow.

## 2012-12-08 NOTE — Assessment & Plan Note (Signed)
New.  Suspect this is due to undertreated GERD.  Start daily PPI.  Check for h pylori, pancreatitis, liver abnormality.  Refer to GI.  Reviewed supportive care and red flags that should prompt return.  Pt expressed understanding and is in agreement w/ plan.

## 2012-12-08 NOTE — Assessment & Plan Note (Signed)
New.  Refer to GI for evaluation. 

## 2012-12-08 NOTE — Assessment & Plan Note (Signed)
New.  Likely multifactorial- depression/anxiety, poor sleep, peri-menopause.  Check labs to r/o anemia, thyroid abnormality.

## 2012-12-09 ENCOUNTER — Encounter: Payer: Self-pay | Admitting: *Deleted

## 2012-12-21 ENCOUNTER — Telehealth: Payer: Self-pay | Admitting: Family Medicine

## 2012-12-21 NOTE — Telephone Encounter (Signed)
Sent pt a secure pt message-smc

## 2012-12-21 NOTE — Telephone Encounter (Signed)
Checked the immunizations records and pt was given Tdap (12-08-2006), so the pt do not need another one.  We can schedule her for a Flu shot only.  Thank you!//AB/CMA

## 2012-12-21 NOTE — Telephone Encounter (Signed)
Pt would like flu/Tdap injection, please review the TDAP to see if patient is eligible and I will schedule below is copied from [employer] message  Appointment Request From: Suzanne Singh With Provider: Neena Rhymes, MD [-Primary Care Physician-] Preferred Date Range: Any date 01/12/2013 or later Preferred Times: Wed Afternoon Reason: To address the following health maintenance concerns. Tetanus/Tdap Influenza Vaccine Comments:

## 2013-01-04 ENCOUNTER — Ambulatory Visit: Payer: BC Managed Care – PPO | Admitting: Internal Medicine

## 2013-01-06 ENCOUNTER — Ambulatory Visit: Payer: BC Managed Care – PPO | Admitting: Internal Medicine

## 2013-01-12 ENCOUNTER — Encounter: Payer: Self-pay | Admitting: Internal Medicine

## 2013-01-12 ENCOUNTER — Ambulatory Visit (INDEPENDENT_AMBULATORY_CARE_PROVIDER_SITE_OTHER): Payer: BC Managed Care – PPO | Admitting: Internal Medicine

## 2013-01-12 VITALS — BP 110/80 | HR 69 | Ht 69.0 in | Wt 189.0 lb

## 2013-01-12 DIAGNOSIS — R195 Other fecal abnormalities: Secondary | ICD-10-CM

## 2013-01-12 DIAGNOSIS — R109 Unspecified abdominal pain: Secondary | ICD-10-CM

## 2013-01-12 DIAGNOSIS — R198 Other specified symptoms and signs involving the digestive system and abdomen: Secondary | ICD-10-CM

## 2013-01-12 DIAGNOSIS — R6881 Early satiety: Secondary | ICD-10-CM

## 2013-01-12 DIAGNOSIS — R194 Change in bowel habit: Secondary | ICD-10-CM

## 2013-01-12 DIAGNOSIS — R1010 Upper abdominal pain, unspecified: Secondary | ICD-10-CM

## 2013-01-12 DIAGNOSIS — K921 Melena: Secondary | ICD-10-CM

## 2013-01-12 MED ORDER — HYOSCYAMINE SULFATE 0.125 MG SL SUBL: 0.1250 mg | SUBLINGUAL_TABLET | SUBLINGUAL | Status: DC | PRN

## 2013-01-12 MED ORDER — SUPREP BOWEL PREP KIT 17.5-3.13-1.6 GM/177ML PO SOLN: ORAL | Status: DC

## 2013-01-12 MED ORDER — ESOMEPRAZOLE MAGNESIUM 40 MG PO CPDR: 40.0000 mg | DELAYED_RELEASE_CAPSULE | Freq: Every day | ORAL | Status: DC

## 2013-01-12 NOTE — Patient Instructions (Addendum)
You have been scheduled for an endoscopy and colonoscopy with propofol. Please follow the written instructions given to you at your visit today. Please  If you use inhalers (even only as needed) or a CPAP machine, please bring them with you on the day of your procedure.  Today you have been given a gas prevention diet handout to read and follow.  We have given you samples of Nexium to try , take one capsule . Before breakfast daily.  We have sent the following medications to your pharmacy for you to pick up at your convenience: Generic Levsin for cramps  We would like for you to purchase Align which is over the counter.This puts good bacteria back into your colon. You should take 1 capsule by mouth once daily.  Thank you for choosing me and [facility].  Iva Boop, M.D., [facility]

## 2013-01-12 NOTE — Progress Notes (Signed)
Subjective:    Patient ID: Suzanne Singh, female    DOB: 1966/04/19, 47 y.o.   MRN: [id number] Referred by: Sheliah Hatch, MD HPI There is a very nice middle-aged [place] American woman who presents with problems of altered bowel habits, abdominal pain. It's been an ongoing problem for about 2 months. She is also having bloating and swelling. Her bowel habits have become irregular. She's had mostly upper abdominal pain with some lower abdominal cramps and will have formed and hard stools and then loose stools even with some urgency at times and leakage of stool. She has fibromyalgia and has been told she has irritable bowel syndrome but things are much worse and they have ever been. She admits she is in the middle of separation and impending divorce and that has been ongoing thinks that that could have had some effect. She is not sleeping well. She has tried Print production planner. She tried Prilosec for 2 weeks which helped epigastric pain and heartburn symptoms some, dicyclomine did help relieve some of the pain but she did not feel right ON was sleepy. Laboratory testing reviewed in the chart and CBC and metabolic panel thyroid are okay. She is actually gained about 11 pounds. There has not been any rectal bleeding but she has had some black stools raising some question of melena. This was transient in last month.. She does eat a fair amount of salads vegetables on a regular basis. She has some early satiety also. She is concerned about some more serious problem ongoing though recognizes this could be worsening of IBS. She did take antibiotics for a sinus infection about 2-3 months ago, the symptoms have started prior to the initiation of this antibiotics. Allergies  Allergen Reactions  . Ambien Cr (Zolpidem Tartrate Er)     Seizure   . Cymbalta (Duloxetine Hcl)     Seizure   . Celebrex (Celecoxib)     Hives    Outpatient Prescriptions Prior to Visit  Medication Sig Dispense Refill  . clonazePAM  (KLONOPIN) 0.5 MG tablet Take 0.5 mg by mouth. 1/2 tablet as needed      . dicyclomine (BENTYL) 20 MG tablet Take 1 tablet (20 mg total) by mouth every 6 (six) hours.  60 tablet  3  . sertraline (ZOLOFT) 100 MG tablet Take 100 mg by mouth daily.       . [DISCONTINUED] omeprazole (PRILOSEC) 40 MG capsule Take 1 capsule (40 mg total) by mouth daily.  30 capsule  3   Last reviewed on 01/12/2013  9:30 AM by Iva Boop, MD Past Medical History  Diagnosis Date  . Eating disorder   . Arthritis   . Depression   . Migraine   . Seizures   . GERD (gastroesophageal reflux disease)   . Fibromyalgia   . IBS (irritable bowel syndrome)   . Arrhythmia    No past surgical history on file. History   Social History  . Marital Status: Married-separated     Spouse Name: N/A    Number of Children: 2  .     Occupational History  .     Social History Main Topics  . Smoking status: Never Smoker   . Smokeless tobacco: Never Used  . Alcohol Use: None     Comment: wine on occasion  . Drug Use: No  . Sexually Active: None   Other Topics Concern  . 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 Currently separated  as of February 2014. Divorced pending this year.2 sons   Family History  Problem Relation Age of Onset  . Alcohol abuse Mother   . Arthritis Mother   . Lung cancer Mother   . Hypertension Mother   . Mental illness Mother   . Alcohol abuse Father   . Heart disease Father   . Hypertension Father   . Diabetes Father   . Early death Sister   . Arthritis Maternal Grandmother   . Cancer Maternal Grandmother   . Hypertension Maternal Grandmother   . Stroke Maternal Grandfather   . Hypertension Maternal Grandfather   . Heart disease Paternal Grandmother   . Hypertension Paternal Grandmother   . Colon cancer Paternal Grandfather   . Heart disease Paternal Grandfather   . Hypertension Paternal Grandfather   . Kidney disease Paternal  Grandfather   . Diabetes Paternal Grandfather         Review of Systems This is positive for those things mentioned in the history of present illness and somewhat diffusely positive otherwise of anxiety joint and back pain, fatigue, excessive urination, night sweats. She think she is perimenopausal. There's been some headaches and palpitations and a sore throat and pedal edema at times as well. All other review of systems negative.    Objective:   Physical Exam General:  Well-developed, well-nourished and in no acute distress Eyes:  anicteric. ENT:   Mouth and posterior pharynx free of lesions.  Neck:   supple w/o thyromegaly or mass.  Lungs: Clear to auscultation bilaterally. Heart:  S1S2, no rubs, murmurs, gallops. Abdomen:  soft, non-tender, no hepatosplenomegaly, hernia, or mass and BS+.  Rectal: deferred Lymph:  no cervical or supraclavicular adenopathy. Extremities:   no edema Skin   no rash. Neuro:  A&O x 3.  Psych:  appropriate mood and  Affect.   Data Reviewed: Lab Results  Component Value Date   WBC 4.4* 12/06/2012   HGB 12.1 12/06/2012   HCT 37.0 12/06/2012   MCV 89.2 12/06/2012   PLT 263.0 12/06/2012     Chemistry      Component Value Date/Time   NA 139 12/06/2012 1216   K 3.9 12/06/2012 1216   CL 104 12/06/2012 1216   CO2 27 12/06/2012 1216   BUN 21 12/06/2012 1216   CREATININE 0.7 12/06/2012 1216      Component Value Date/Time   CALCIUM 8.9 12/06/2012 1216   ALKPHOS 70 12/06/2012 1216   AST 20 12/06/2012 1216   ALT 17 12/06/2012 1216   BILITOT 0.4 12/06/2012 1216     Lab Results  Component Value Date   TSH 1.18 12/06/2012   Wt Readings from Last 3 Encounters:  01/12/13 189 lb (85.73 kg)  12/06/12 186 lb 3.2 oz (84.46 kg)  07/22/12 179 lb (81.194 kg)      Assessment & Plan:   1. Upper abdominal pain   2. Change in bowel habits   3. Early satiety   4. Black stools    1. 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 2. We talked about possibly medical management and followup versus endoscopic investigation and decided to go with the latter. She has enough signs and symptoms to warrant this I think, though I expect it will be okay I think proving that will help reassure her and improve her overall quality of life. We will potentially miss an important gastrointestinal disease or neoplasm. The risks and benefits as well as  alternatives of endoscopic procedure(s) have been discussed and reviewed. All questions answered. The patient agrees to proceed. She will start Nexium 40 mg daily, samples provided. Hyoscyamine 0.125 mg as needed every 4 hours for cramps and 7 dicyclomine. She is advised to start align probiotic also, a coupon for discounted purchase provided. Low flatulence and gas prevention diet instructions and pamphlet form provided. The family history of colon cancer in a grandparent does not significantly increase her risk of colon cancer above average routine.  I appreciate the opportunity to care for this patient.  CC: Neena Rhymes, MD

## 2013-01-19 ENCOUNTER — Ambulatory Visit: Payer: BC Managed Care – PPO | Admitting: Family Medicine

## 2013-01-22 ENCOUNTER — Other Ambulatory Visit: Payer: Self-pay

## 2013-02-03 ENCOUNTER — Encounter: Payer: Self-pay | Admitting: Family Medicine

## 2013-02-03 ENCOUNTER — Ambulatory Visit (AMBULATORY_SURGERY_CENTER): Payer: BC Managed Care – PPO | Admitting: Internal Medicine

## 2013-02-03 ENCOUNTER — Encounter: Payer: Self-pay | Admitting: Internal Medicine

## 2013-02-03 VITALS — BP 126/91 | HR 72 | Temp 99.4°F | Resp 22 | Ht 69.0 in | Wt 189.0 lb

## 2013-02-03 DIAGNOSIS — R109 Unspecified abdominal pain: Secondary | ICD-10-CM

## 2013-02-03 DIAGNOSIS — R198 Other specified symptoms and signs involving the digestive system and abdomen: Secondary | ICD-10-CM

## 2013-02-03 MED ORDER — SODIUM CHLORIDE 0.9 % IV SOLN: 500.0000 mL | INTRAVENOUS | Status: DC

## 2013-02-03 NOTE — Op Note (Signed)
[facility] [address] [place], [address]   COLONOSCOPY PROCEDURE REPORT  PATIENT: Suzanne Singh, Suzanne Singh  MR#: [id number] BIRTHDATE: [birth year] , 47  yrs. old GENDER: Female ENDOSCOPIST: Iva Boop, MD, [facility] REFERRED LK:[id number] Assunta Found, M.D. PROCEDURE DATE:  02/03/2013 PROCEDURE:   Colonoscopy, diagnostic ASA CLASS:   Class II INDICATIONS:Change in bowel habits. MEDICATIONS: There was residual sedation effect present from prior procedure, propofol (Diprivan) 300mg  IV, MAC sedation, administered by CRNA, and These medications were titrated to patient response per physician's verbal order  DESCRIPTION OF PROCEDURE:   After the risks benefits and alternatives of the procedure were thoroughly explained, informed consent was obtained.  A digital rectal exam revealed no abnormalities of the rectum.   The LB PCF-H180AL [id number]  endoscope was introduced through the anus and advanced to the cecum, which was identified by both the appendix and ileocecal valve. No adverse events experienced.   The quality of the prep was Suprep excellent The instrument was then slowly withdrawn as the colon was fully examined.      COLON FINDINGS: A normal appearing cecum, ileocecal valve, and appendiceal orifice were identified.  The ascending, hepatic flexure, transverse, splenic flexure, descending, sigmoid colon and rectum appeared unremarkable.  No polyps or cancers were seen. Retroflexed views revealed no abnormalities. The time to cecum=6 minutes 02 seconds.  Withdrawal time=12 minutes 58 seconds.  The scope was withdrawn and the procedure completed. COMPLICATIONS: There were no complications.  ENDOSCOPIC IMPRESSION: Normal colonoscopy - good prep  RECOMMENDATIONS: 1.  Repeat Colonscopy in 10 years - 2024 2.   Stay on low fiber diet as recommended and use probiotic + intermittent anti-spasmodics.   eSigned:  Iva Boop, MD, [facility] 02/03/2013 3:36 PM   cc:  Sheliah Hatch, MD and The Patient

## 2013-02-03 NOTE — Patient Instructions (Addendum)
The upper endoscopy was normal.  The colonoscopy was also normal with a good prep.  I think your problems are from IBS.  Please follow the diet instructions and use a probiotic as recommended on 2/5.  If you do not think your symptoms are controlled adequately after 6 weeks or so return to see me.  Next routine colonoscopy in 10 years - 2024.  Thank you for choosing me and [facility].  Iva Boop, MD, FACG  YOU HAD AN ENDOSCOPIC PROCEDURE TODAY AT THE [facility]: Refer to the procedure report that was given to you for any specific questions about what was found during the examination.  If the procedure report does not answer your questions, please call your gastroenterologist to clarify.  If you requested that your care partner not be given the details of your procedure findings, then the procedure report has been included in a sealed envelope for you to review at your convenience later.  YOU SHOULD EXPECT: Some feelings of bloating in the abdomen. Passage of more gas than usual.  Walking can help get rid of the air that was put into your GI tract during the procedure and reduce the bloating. If you had a lower endoscopy (such as a colonoscopy or flexible sigmoidoscopy) you may notice spotting of blood in your stool or on the toilet paper. If you underwent a bowel prep for your procedure, then you may not have a normal bowel movement for a few days.  DIET: Your first meal following the procedure should be a light meal and then it is ok to progress to your normal diet.  A half-sandwich or bowl of soup is an example of a good first meal.  Heavy or fried foods are harder to digest and may make you feel nauseous or bloated.  Likewise meals heavy in dairy and vegetables can cause extra gas to form and this can also increase the bloating.  Drink plenty of fluids but you should avoid alcoholic beverages for 24 hours.  ACTIVITY: Your care partner should take you home  directly after the procedure.  You should plan to take it easy, moving slowly for the rest of the day.  You can resume normal activity the day after the procedure however you should NOT DRIVE or use heavy machinery for 24 hours (because of the sedation medicines used during the test).    SYMPTOMS TO REPORT IMMEDIATELY: A gastroenterologist can be reached at any hour.  During normal business hours, 8:30 AM to 5:00 PM Monday through Friday, call [phone].  After hours and on weekends, please call the GI answering service at [phone] who will take a message and have the physician on call contact you.   Following lower endoscopy (colonoscopy or flexible sigmoidoscopy):  Excessive amounts of blood in the stool  Significant tenderness or worsening of abdominal pains  Swelling of the abdomen that is new, acute  Fever of 100F or higher  Following upper endoscopy (EGD)  Vomiting of blood or coffee ground material  New chest pain or pain under the shoulder blades  Painful or persistently difficult swallowing  New shortness of breath  Fever of 100F or higher  Black, tarry-looking stools  FOLLOW UP: If any biopsies were taken you will be contacted by phone or by letter within the next 1-3 weeks.  Call your gastroenterologist if you have not heard about the biopsies in 3 weeks.  Our staff will call the home number listed on your records  the next business day following your procedure to check on you and address any questions or concerns that you may have at that time regarding the information given to you following your procedure. This is a courtesy call and so if there is no answer at the home number and we have not heard from you through the emergency physician on call, we will assume that you have returned to your regular daily activities without incident.  SIGNATURES/CONFIDENTIALITY: You and/or your care partner have signed paperwork which will be entered into your electronic medical  record.  These signatures attest to the fact that that the information above on your After Visit Summary has been reviewed and is understood.  Full responsibility of the confidentiality of this discharge information lies with you and/or your care-partner.  Repeat colonoscopy in 10 years  IBS -handout given  Stay on low-fiber diet and use probiotic and anti-spasmodics (levsin, bentyl)

## 2013-02-03 NOTE — Op Note (Signed)
[facility] [address] [place], [address]   ENDOSCOPY PROCEDURE REPORT  PATIENT: Suzanne Singh, Suzanne Singh  MR#: [id number] BIRTHDATE: February 18, 1966 , 47  yrs. old GENDER: Female ENDOSCOPIST: Iva Boop, MD, [facility] REFERRED BY:  Sheliah Hatch, M.D. PROCEDURE DATE:  02/03/2013 PROCEDURE:  EGD, diagnostic ASA CLASS:     Class II INDICATIONS:  Epigastric pain. MEDICATIONS: propofol (Diprivan) 200mg  IV, MAC sedation, administered by CRNA, and These medications were titrated to patient response per physician's verbal order TOPICAL ANESTHETIC: Cetacaine Spray  DESCRIPTION OF PROCEDURE: After the risks benefits and alternatives of the procedure were thoroughly explained, informed consent was obtained.  The LB GIF-H180 [id number] endoscope was introduced through the mouth and advanced to the second portion of the duodenum. Without limitations.  The instrument was slowly withdrawn as the mucosa was fully examined.      The upper, middle and distal third of the esophagus were carefully inspected and no abnormalities were noted.  The z-line was well seen at the GEJ.  The endoscope was pushed into the fundus which was normal including a retroflexed view.  The antrum, gastric body, first and second part of the duodenum were unremarkable. Retroflexed views revealed no abnormalities.     The scope was then withdrawn from the patient and the procedure completed.  COMPLICATIONS: There were no complications. ENDOSCOPIC IMPRESSION: Normal EGD  RECOMMENDATIONS: Proceed with a Colonoscopy.   eSigned:  Iva Boop, MD, [facility] 02/03/2013 3:31 PM   XB:[id number] Assunta Found, MD and The Patient

## 2013-02-03 NOTE — Progress Notes (Signed)
Patient did not experience any of the following events: a burn prior to discharge; a fall within the facility; wrong site/side/patient/procedure/implant event; or a hospital transfer or hospital admission upon discharge from the facility. (G8907) Patient did not have preoperative order for IV antibiotic SSI prophylaxis. (G8918)  

## 2013-02-04 ENCOUNTER — Telehealth: Payer: Self-pay | Admitting: *Deleted

## 2013-02-04 ENCOUNTER — Telehealth: Payer: Self-pay | Admitting: Internal Medicine

## 2013-02-04 NOTE — Telephone Encounter (Signed)
Left message that we called for f/u 

## 2013-02-04 NOTE — Telephone Encounter (Signed)
Sounds musculoskeletal  1) ibuprofen 800 mg every 6 hrs x weekend 2) heating pad 3) avoid twisting/turning, etc as much as possible, also brace abdomen with pillow or similar object if coughing/sneezing/laughing

## 2013-02-04 NOTE — Telephone Encounter (Signed)
Pt notified of all of Dr Marvell Fuller instructions below. Pt verbalized understanding of all instructions given per Dr Leone Payor. Told pt to call  Dr on call ( emergency number )  If pain increases and she verbalized understanding of that as well. ewm

## 2013-02-04 NOTE — Telephone Encounter (Signed)
Pt states when she takes a deep breath or laughs hard she has a pain, like a pull, a muscle spasm. Its not like the cramping that she came in for. Is more like a muscle spasm on the left side. It did start yesterday and has progressively gotten worse and even woke her from sleep last pm. No bleeding, been able to eat with no problems. She is at work today. Pt questioned is this normal and what can she do for this. Pt rates her pain a 9/10 when it occurs. She has taken advil with no help, has also used the levsin and bentyl as directed. States if she turns a certain way it takes her breath it hurts so much. Please advise . ewm

## 2013-06-03 ENCOUNTER — Other Ambulatory Visit: Payer: Self-pay | Admitting: Family Medicine

## 2013-06-03 ENCOUNTER — Ambulatory Visit (INDEPENDENT_AMBULATORY_CARE_PROVIDER_SITE_OTHER): Payer: BC Managed Care – PPO | Admitting: Family Medicine

## 2013-06-03 ENCOUNTER — Encounter: Payer: Self-pay | Admitting: Family Medicine

## 2013-06-03 VITALS — BP 118/80 | HR 72 | Temp 98.3°F | Ht 69.5 in | Wt 190.0 lb

## 2013-06-03 DIAGNOSIS — F419 Anxiety disorder, unspecified: Secondary | ICD-10-CM

## 2013-06-03 DIAGNOSIS — F341 Dysthymic disorder: Secondary | ICD-10-CM

## 2013-06-03 DIAGNOSIS — K219 Gastro-esophageal reflux disease without esophagitis: Secondary | ICD-10-CM

## 2013-06-03 MED ORDER — ESOMEPRAZOLE MAGNESIUM 40 MG PO CPDR: 40.0000 mg | DELAYED_RELEASE_CAPSULE | Freq: Every day | ORAL | Status: DC

## 2013-06-03 MED ORDER — BUPROPION HCL ER (XL) 150 MG PO TB24: 150.0000 mg | ORAL_TABLET | Freq: Every day | ORAL | Status: DC

## 2013-06-03 MED ORDER — FLUOXETINE HCL 40 MG PO CAPS: 40.0000 mg | ORAL_CAPSULE | Freq: Every day | ORAL | Status: DC

## 2013-06-03 MED ORDER — SERTRALINE HCL 100 MG PO TABS: 100.0000 mg | ORAL_TABLET | Freq: Every day | ORAL | Status: DC

## 2013-06-03 NOTE — Progress Notes (Signed)
  Subjective:    Patient ID: Suzanne Singh, female    DOB: February 28, 1966, 47 y.o.   MRN: [id number]  HPI Depression/anxiety- pt has been on Zoloft for over a year.  'not sure that it's working'.  Husband filed for divorce.  Pt feels bad days are now outnumbering the good.  'tired, listless'.  Low energy, decreased motivation.  Also having muscle and joint pain (hx of arthritis and fibromyalgia)  Hx of seizures and intolerance to depression meds in past.  + weight gain.  Fatigue- pt unclear if this is due to depression/anxiety or if there is something else at play.   Review of Systems For ROS see HPI     Objective:   Physical Exam  Vitals reviewed. Constitutional: She is oriented to person, place, and time. She appears well-developed and well-nourished. No distress.  HENT:  Head: Normocephalic and atraumatic.  Cardiovascular: Normal rate, regular rhythm, normal heart sounds and intact distal pulses.   Pulmonary/Chest: Effort normal and breath sounds normal. No respiratory distress. She has no wheezes. She has no rales.  Musculoskeletal: She exhibits no edema.  Neurological: She is alert and oriented to person, place, and time.  Skin: Skin is warm and dry.  Psychiatric: She has a normal mood and affect. Her behavior is normal. Thought content normal.          Assessment & Plan:

## 2013-06-03 NOTE — Patient Instructions (Addendum)
Follow up as scheduled in August STOP the Zoloft START the Fluoxetine daily (we're not going to do Wellbutrin b/c of your history of seizures) Call with any questions or concerns Hang in there!

## 2013-06-03 NOTE — Assessment & Plan Note (Signed)
Deteriorated.  Initially discussed adding Wellbutrin to current meds but due to pt's hx of seizures, will avoid wellbutrin at this time.  Will switch to Prozac 40mg  and monitor for sxs improvement.  Continue counseling.  Pt expressed understanding and is in agreement w/ plan.

## 2013-06-03 NOTE — Assessment & Plan Note (Signed)
Refill provided

## 2013-06-03 NOTE — Telephone Encounter (Signed)
Last seen 06/03/13 and filled 05/17/12. Please advise     KP

## 2013-06-21 ENCOUNTER — Other Ambulatory Visit: Payer: Self-pay | Admitting: Family Medicine

## 2013-06-21 DIAGNOSIS — Z1231 Encounter for screening mammogram for malignant neoplasm of breast: Secondary | ICD-10-CM

## 2013-07-20 ENCOUNTER — Ambulatory Visit (HOSPITAL_COMMUNITY)
Admission: RE | Admit: 2013-07-20 | Discharge: 2013-07-20 | Disposition: A | Discharge status: Home / Self Care | Payer: BC Managed Care – PPO | Source: Ambulatory Visit | Attending: Family Medicine | Admitting: Family Medicine

## 2013-07-20 DIAGNOSIS — Z1231 Encounter for screening mammogram for malignant neoplasm of breast: Secondary | ICD-10-CM | POA: Insufficient documentation

## 2013-07-28 ENCOUNTER — Ambulatory Visit (INDEPENDENT_AMBULATORY_CARE_PROVIDER_SITE_OTHER): Payer: BC Managed Care – PPO | Admitting: Family Medicine

## 2013-07-28 ENCOUNTER — Encounter: Payer: Self-pay | Admitting: Family Medicine

## 2013-07-28 VITALS — BP 122/80 | HR 81 | Temp 98.8°F | Ht 68.75 in | Wt 181.2 lb

## 2013-07-28 DIAGNOSIS — IMO0001 Reserved for inherently not codable concepts without codable children: Secondary | ICD-10-CM

## 2013-07-28 DIAGNOSIS — G4700 Insomnia, unspecified: Secondary | ICD-10-CM | POA: Insufficient documentation

## 2013-07-28 DIAGNOSIS — Z01419 Encounter for gynecological examination (general) (routine) without abnormal findings: Secondary | ICD-10-CM

## 2013-07-28 DIAGNOSIS — M797 Fibromyalgia: Secondary | ICD-10-CM

## 2013-07-28 LAB — LIPID PANEL
Cholesterol: 163 mg/dL (ref 0–200)
HDL: 55 mg/dL (ref >39.00)
Triglycerides: 63 mg/dL (ref 0.0–149.0)
VLDL: 12.6 mg/dL (ref 0.0–40.0)

## 2013-07-28 LAB — TSH: TSH: 0.47 u[IU]/mL (ref 0.35–5.50)

## 2013-07-28 LAB — CBC WITH DIFFERENTIAL/PLATELET
Basophils Relative: 0.5 % (ref 0.0–3.0)
Eosinophils Relative: 0.8 % (ref 0.0–5.0)
Hemoglobin: 11.3 g/dL — ABNORMAL LOW (ref 12.0–15.0)
Lymphocytes Relative: 38.6 % (ref 12.0–46.0)
MCHC: 33.3 g/dL (ref 30.0–36.0)
Monocytes Relative: 12 % (ref 3.0–12.0)
Neutro Abs: 1.8 10*3/uL (ref 1.4–7.7)
RBC: 3.79 Mil/uL — ABNORMAL LOW (ref 3.87–5.11)
WBC: 3.8 10*3/uL — ABNORMAL LOW (ref 4.5–10.5)

## 2013-07-28 LAB — HEPATIC FUNCTION PANEL
AST: 39 U/L — ABNORMAL HIGH (ref 0–37)
Albumin: 4 g/dL (ref 3.5–5.2)
Total Bilirubin: 0.5 mg/dL (ref 0.3–1.2)

## 2013-07-28 LAB — BASIC METABOLIC PANEL
BUN: 12 mg/dL (ref 6–23)
Calcium: 9.3 mg/dL (ref 8.4–10.5)
Creatinine, Ser: 0.9 mg/dL (ref 0.4–1.2)
GFR: 92.78 mL/min (ref >60.00)
Potassium: 3.6 mEq/L (ref 3.5–5.1)

## 2013-07-28 MED ORDER — CYCLOBENZAPRINE HCL 10 MG PO TABS: 10.0000 mg | ORAL_TABLET | Freq: Three times a day (TID) | ORAL | Status: DC | PRN

## 2013-07-28 MED ORDER — TRAZODONE HCL 50 MG PO TABS: 25.0000 mg | ORAL_TABLET | Freq: Every evening | ORAL | Status: DC | PRN

## 2013-07-28 NOTE — Patient Instructions (Addendum)
We'll notify you of your lab results and make any changes if needed Keep up the good work! Use the Flexeril for muscle spasm (don't take at the same time as the trazodone) Take 1/2 tab of Trazodone as needed for sleep- may increase to whole tab as needed Call with any questions or concerns GOOD LUCK w/ BACK TO SCHOOL!!!

## 2013-07-28 NOTE — Progress Notes (Signed)
  Subjective:    Patient ID: Suzanne Singh, female    DOB: 22-Jul-1966, 47 y.o.   MRN: [id number]  HPI CPE- UTD on pap, mammo, colonoscopy.   Review of Systems Patient reports no vision/ hearing changes, adenopathy,fever, weight change,  persistant/recurrent hoarseness , swallowing issues, chest pain, palpitations, edema, persistant/recurrent cough, hemoptysis, dyspnea (rest/exertional/paroxysmal nocturnal), gastrointestinal bleeding (melena, rectal bleeding), abdominal pain, significant heartburn, bowel changes, GU symptoms (dysuria, hematuria, incontinence), Gyn symptoms (abnormal  bleeding, pain),  syncope, focal weakness, memory loss, numbness & tingling, skin/hair/nail changes, abnormal bruising or bleeding, anxiety, or depression.  + insomnia- difficulty staying asleep.   + fibro flare- muscle spasm/tightness    Objective:   Physical Exam General Appearance:    Alert, cooperative, no distress, appears stated age  Head:    Normocephalic, without obvious abnormality, atraumatic  Eyes:    PERRL, conjunctiva/corneas clear, EOM's intact, fundi    benign, both eyes  Ears:    Normal TM's and external ear canals, both ears  Nose:   Nares normal, septum midline, mucosa normal, no drainage    or sinus tenderness  Throat:   Lips, mucosa, and tongue normal; teeth and gums normal  Neck:   Supple, symmetrical, trachea midline, no adenopathy;    Thyroid: no enlargement/tenderness/nodules  Back:     Symmetric, no curvature, ROM normal, no CVA tenderness  Lungs:     Clear to auscultation bilaterally, respirations unlabored  Chest Wall:    No tenderness or deformity   Heart:    Regular rate and rhythm, S1 and S2 normal, no murmur, rub   or gallop  Breast Exam:    Deferred to mammo  Abdomen:     Soft, non-tender, bowel sounds active all four quadrants,    no masses, no organomegaly  Genitalia:    Deferred  Rectal:    Extremities:   Extremities normal, atraumatic, no cyanosis or edema  Pulses:    2+ and symmetric all extremities  Skin:   Skin color, texture, turgor normal, no rashes or lesions  Lymph nodes:   Cervical, supraclavicular, and axillary nodes normal  Neurologic:   CNII-XII intact, normal strength, sensation and reflexes    throughout          Assessment & Plan:

## 2013-07-28 NOTE — Assessment & Plan Note (Signed)
New.  Mood has improved but pt continues to have difficulty staying asleep.  Add low dose trazodone and monitor for improvement.

## 2013-07-28 NOTE — Assessment & Plan Note (Signed)
Deteriorated.  Add flexeril prn.

## 2013-07-28 NOTE — Assessment & Plan Note (Signed)
Pt's PE WNL.  UTD on health maintenance.  Check labs.  Anticipatory guidance provided.  

## 2013-10-10 ENCOUNTER — Other Ambulatory Visit: Payer: Self-pay | Admitting: Family Medicine

## 2013-10-10 NOTE — Telephone Encounter (Signed)
Med filled.  

## 2013-10-13 ENCOUNTER — Other Ambulatory Visit: Payer: Self-pay

## 2013-10-25 ENCOUNTER — Emergency Department (HOSPITAL_COMMUNITY): Payer: BC Managed Care – PPO

## 2013-10-25 ENCOUNTER — Telehealth: Payer: Self-pay | Admitting: Family Medicine

## 2013-10-25 ENCOUNTER — Encounter (HOSPITAL_COMMUNITY): Payer: Self-pay | Admitting: Emergency Medicine

## 2013-10-25 ENCOUNTER — Emergency Department (HOSPITAL_COMMUNITY)
Admission: EM | Admit: 2013-10-25 | Discharge: 2013-10-25 | Disposition: A | Discharge status: Home / Self Care | Payer: BC Managed Care – PPO | Attending: Emergency Medicine | Admitting: Emergency Medicine

## 2013-10-25 DIAGNOSIS — F3289 Other specified depressive episodes: Secondary | ICD-10-CM | POA: Insufficient documentation

## 2013-10-25 DIAGNOSIS — Z8679 Personal history of other diseases of the circulatory system: Secondary | ICD-10-CM | POA: Insufficient documentation

## 2013-10-25 DIAGNOSIS — M129 Arthropathy, unspecified: Secondary | ICD-10-CM | POA: Insufficient documentation

## 2013-10-25 DIAGNOSIS — G40909 Epilepsy, unspecified, not intractable, without status epilepticus: Secondary | ICD-10-CM | POA: Insufficient documentation

## 2013-10-25 DIAGNOSIS — Z79899 Other long term (current) drug therapy: Secondary | ICD-10-CM | POA: Insufficient documentation

## 2013-10-25 DIAGNOSIS — F329 Major depressive disorder, single episode, unspecified: Secondary | ICD-10-CM | POA: Insufficient documentation

## 2013-10-25 DIAGNOSIS — R079 Chest pain, unspecified: Secondary | ICD-10-CM | POA: Insufficient documentation

## 2013-10-25 DIAGNOSIS — Z8739 Personal history of other diseases of the musculoskeletal system and connective tissue: Secondary | ICD-10-CM | POA: Insufficient documentation

## 2013-10-25 DIAGNOSIS — K219 Gastro-esophageal reflux disease without esophagitis: Secondary | ICD-10-CM | POA: Insufficient documentation

## 2013-10-25 DIAGNOSIS — K589 Irritable bowel syndrome without diarrhea: Secondary | ICD-10-CM | POA: Insufficient documentation

## 2013-10-25 LAB — BASIC METABOLIC PANEL
BUN: 12 mg/dL (ref 6–23)
Calcium: 9.5 mg/dL (ref 8.4–10.5)
Chloride: 102 mEq/L (ref 96–112)
Creatinine, Ser: 0.72 mg/dL (ref 0.50–1.10)
GFR calc Af Amer: >90 mL/min (ref >90)
Sodium: 137 mEq/L (ref 135–145)

## 2013-10-25 LAB — POCT I-STAT TROPONIN I: Troponin i, poc: 0 ng/mL (ref 0.00–0.08)

## 2013-10-25 LAB — CBC
HCT: 36.2 % (ref 36.0–46.0)
MCH: 30.7 pg (ref 26.0–34.0)
MCHC: 33.1 g/dL (ref 30.0–36.0)
MCV: 92.6 fL (ref 78.0–100.0)
RBC: 3.91 MIL/uL (ref 3.87–5.11)
RDW: 12.9 % (ref 11.5–15.5)
WBC: 4.3 10*3/uL (ref 4.0–10.5)

## 2013-10-25 MED ORDER — TRAMADOL HCL 50 MG PO TABS: 50.0000 mg | ORAL_TABLET | Freq: Four times a day (QID) | ORAL | Status: DC | PRN

## 2013-10-25 NOTE — ED Notes (Signed)
Reports onset today 1130 of right side sharp chest pains that radiates down right arm and into right upper back. ekg done at triage. Reports mild nausea, no sob, no acute distress noted at triage.

## 2013-10-25 NOTE — Telephone Encounter (Signed)
Patient Information:  Caller Name: Aram Beecham  Phone: [phone]  Patient: Rada Hay  Gender: Female  DOB: Apr 06, 1966  Age: 47 Years  PCP: Sheliah Hatch  Pregnant: No  Office Follow Up:  Does the office need to follow up with this patient?: No  Instructions For The Office: N/A  RN Note:  onset chest pain 1300 10/25/13 on right side of chest.  States BP 140/110.  No shortness of breath.  States there is pain in shoulder as well.  Per chest pain protocol, advised ED now due to pain also present in shoulder, arm; patient agrees to have someone take her to [facility] ED.  krs/can  Symptoms  Reason For Call & Symptoms: chest pain  BP 140/110  Reviewed Health History In EMR: Yes  Reviewed Medications In EMR: Yes  Reviewed Allergies In EMR: Yes  Reviewed Surgeries / Procedures: Yes  Date of Onset of Symptoms: 10/25/2013 OB / GYN:  LMP: Unknown  Guideline(s) Used:  Chest Pain  Disposition Per Guideline:   Go to ED Now  Reason For Disposition Reached:   Pain also present in shoulder(s) or arm(s) or jaw  Advice Given:  N/A  Patient Will Follow Care Advice:  YES

## 2013-10-25 NOTE — ED Provider Notes (Signed)
CSN: [id number]     Arrival date & time 10/25/13  1433 History   First MD Initiated Contact with Patient 10/25/13 1531     Chief Complaint  Patient presents with  . Chest Pain   (Consider location/radiation/quality/duration/timing/severity/associated sxs/prior Treatment) Patient is a 47 y.o. female presenting with chest pain. The history is provided by the patient (the pt had some left chest pain earlier today).  Chest Pain Pain location:  L chest Pain quality: aching   Pain radiates to:  Does not radiate Pain radiates to the back: yes   Pain severity:  Moderate Onset quality:  Sudden Timing:  Intermittent Progression:  Improving Associated symptoms: no abdominal pain, no back pain, no cough, no fatigue and no headache     Past Medical History  Diagnosis Date  . Eating disorder   . Arthritis   . Depression   . Migraine   . Seizures   . GERD (gastroesophageal reflux disease)   . Fibromyalgia   . IBS (irritable bowel syndrome)   . Arrhythmia    History reviewed. No pertinent past surgical history. Family History  Problem Relation Age of Onset  . Alcohol abuse Mother   . Arthritis Mother   . Lung cancer Mother   . Hypertension Mother   . Mental illness Mother   . Alcohol abuse Father   . Heart disease Father   . Hypertension Father   . Diabetes Father   . Colon cancer Father   . Early death Sister   . Arthritis Maternal Grandmother   . Hypertension Maternal Grandmother   . Breast cancer Maternal Grandmother   . Stroke Maternal Grandfather   . Hypertension Maternal Grandfather   . Heart disease Paternal Grandmother   . Hypertension Paternal Grandmother   . Colon cancer Paternal Grandfather   . Heart disease Paternal Grandfather   . Hypertension Paternal Grandfather   . Kidney disease Paternal Grandfather   . Diabetes Paternal Grandfather    History  Substance Use Topics  . Smoking status: Never Smoker   . Smokeless tobacco: Never Used  . Alcohol Use: No     Comment: wine on occasion   OB History   Grav Para Term Preterm Abortions TAB SAB Ect Mult Living                 Review of Systems  Constitutional: Negative for appetite change and fatigue.  HENT: Negative for congestion, ear discharge and sinus pressure.   Eyes: Negative for discharge.  Respiratory: Negative for cough.   Cardiovascular: Positive for chest pain.  Gastrointestinal: Negative for abdominal pain and diarrhea.  Genitourinary: Negative for frequency and hematuria.  Musculoskeletal: Negative for back pain.  Skin: Negative for rash.  Neurological: Negative for seizures and headaches.  Psychiatric/Behavioral: Negative for hallucinations.    Allergies  Ambien cr; Cymbalta; and Celebrex  Home Medications   Current Outpatient Rx  Name  Route  Sig  Dispense  Refill  . clonazePAM (KLONOPIN) 0.5 MG tablet   Oral   Take 0.5 mg by mouth daily as needed for anxiety.         . cyclobenzaprine (FLEXERIL) 10 MG tablet   Oral   Take 1 tablet (10 mg total) by mouth 3 (three) times daily as needed for muscle spasms.   30 tablet   0   . dicyclomine (BENTYL) 20 MG tablet   Oral   Take 20 mg by mouth every 6 (six) hours. Stomach cramps         .  esomeprazole (NEXIUM) 40 MG capsule   Oral   Take 40 mg by mouth daily before breakfast. Acid reflux         . FLUoxetine (PROZAC) 40 MG capsule      TAKE ONE CAPSULE BY MOUTH DAILY   90 capsule   0     **Patient requests 90 days supply**   . traMADol (ULTRAM) 50 MG tablet   Oral   Take 1 tablet (50 mg total) by mouth every 6 (six) hours as needed.   15 tablet   0    BP 118/80  Pulse 66  Temp(Src) 98.3 F (36.8 C) (Oral)  Resp 19  Ht 5\' 10"  (1.778 m)  Wt 187 lb 1.6 oz (84.868 kg)  BMI 26.85 kg/m2  SpO2 100%  LMP 10/18/2013 Physical Exam  Constitutional: She is oriented to person, place, and time. She appears well-developed.  HENT:  Head: Normocephalic.  Eyes: Conjunctivae and EOM are normal. No scleral  icterus.  Neck: Neck supple. No thyromegaly present.  Cardiovascular: Normal rate and regular rhythm.  Exam reveals no gallop and no friction rub.   No murmur heard. Pulmonary/Chest: No stridor. She has no wheezes. She has no rales. She exhibits no tenderness.  Abdominal: She exhibits no distension. There is no tenderness. There is no rebound.  Musculoskeletal: Normal range of motion. She exhibits no edema.  Lymphadenopathy:    She has no cervical adenopathy.  Neurological: She is oriented to person, place, and time. She exhibits normal muscle tone. Coordination normal.  Skin: No rash noted. No erythema.  Psychiatric: She has a normal mood and affect. Her behavior is normal.    ED Course  Procedures (including critical care time) Labs Review Labs Reviewed  CBC  BASIC METABOLIC PANEL  D-DIMER, QUANTITATIVE  POCT I-STAT TROPONIN I   Imaging Review Dg Chest 2 View  10/25/2013   CLINICAL DATA:  Chest pain, hypertension  EXAM: CHEST  2 VIEW  COMPARISON:  02/27/2011  FINDINGS: Normal heart size and vascularity. Clear lungs. Negative for pneumonia, collapse or consolidation. No effusion or pneumothorax. Trachea midline.  IMPRESSION: Stable exam.  No acute process   Electronically Signed   By: Ruel Favors M.D.   On: 10/25/2013 17:14    EKG Interpretation    Date/Time:  Tuesday October 25 2013 14:37:53 EST Ventricular Rate:  68 PR Interval:  158 QRS Duration: 78 QT Interval:  380 QTC Calculation: 404 R Axis:   59 Text Interpretation:  Normal sinus rhythm Cannot rule out Anterior infarct , age undetermined Abnormal ECG Confirmed by Kashius Dominic  MD, Rylie Limburg ([id number]) on 10/25/2013 3:33:31 PM            MDM   1. Chest pain       Benny Lennert, MD 10/25/13 [phone]

## 2013-11-07 ENCOUNTER — Ambulatory Visit (INDEPENDENT_AMBULATORY_CARE_PROVIDER_SITE_OTHER): Payer: BC Managed Care – PPO | Admitting: Family Medicine

## 2013-11-07 ENCOUNTER — Encounter: Payer: Self-pay | Admitting: Family Medicine

## 2013-11-07 VITALS — BP 140/96 | HR 85 | Temp 98.5°F | Resp 16 | Wt 187.4 lb

## 2013-11-07 DIAGNOSIS — R030 Elevated blood-pressure reading, without diagnosis of hypertension: Secondary | ICD-10-CM

## 2013-11-07 DIAGNOSIS — J320 Chronic maxillary sinusitis: Secondary | ICD-10-CM

## 2013-11-07 DIAGNOSIS — IMO0001 Reserved for inherently not codable concepts without codable children: Secondary | ICD-10-CM

## 2013-11-07 DIAGNOSIS — F329 Major depressive disorder, single episode, unspecified: Secondary | ICD-10-CM

## 2013-11-07 DIAGNOSIS — F341 Dysthymic disorder: Secondary | ICD-10-CM

## 2013-11-07 DIAGNOSIS — F419 Anxiety disorder, unspecified: Secondary | ICD-10-CM

## 2013-11-07 MED ORDER — PROMETHAZINE-DM 6.25-15 MG/5ML PO SYRP: 5.0000 mL | ORAL_SOLUTION | Freq: Four times a day (QID) | ORAL | Status: DC | PRN

## 2013-11-07 MED ORDER — AMOXICILLIN 875 MG PO TABS: 875.0000 mg | ORAL_TABLET | Freq: Two times a day (BID) | ORAL | Status: DC

## 2013-11-07 MED ORDER — BENZONATATE 200 MG PO CAPS: 200.0000 mg | ORAL_CAPSULE | Freq: Three times a day (TID) | ORAL | Status: DC | PRN

## 2013-11-07 MED ORDER — CLONAZEPAM 0.5 MG PO TABS: 0.5000 mg | ORAL_TABLET | Freq: Every day | ORAL | Status: DC | PRN

## 2013-11-07 NOTE — Assessment & Plan Note (Signed)
New to provider.  Pt was seen at ER for similar.  Not currently on meds.  Not accurate reading today b/c pt is uncomfortable and has been taking OTC meds.  Will follow closely.  Pt expressed understanding and is in agreement w/ plan.

## 2013-11-07 NOTE — Progress Notes (Signed)
   Subjective:    Patient ID: Suzanne Singh, female    DOB: [birth year], 47 y.o.   MRN: [id number]  HPI Pre visit review using our clinic review tool, if applicable. No additional management support is needed unless otherwise documented below in the visit note.  URI- sxs started 4 days ago w/ sore throat.  Now bilateral ear pain, PND.  + maxillary sinus pressure.  No fever.  Took OTC cough/cold med- BP is mildly elevated.  Cough started yesterday- productive.  + sick contacts.  Anxiety- needs refill on Klonopin  Elevated BP- pt went to ER last month w/ elevated BP.  Was not started on meds.  Has not followed up.  Now on OTC cough/cold meds.   Review of Systems For ROS see HPI     Objective:   Physical Exam  Vitals reviewed. Constitutional: She appears well-developed and well-nourished. No distress.  HENT:  Head: Normocephalic and atraumatic.  Right Ear: Tympanic membrane normal.  Left Ear: Tympanic membrane normal.  Nose: Mucosal edema and rhinorrhea present. Right sinus exhibits maxillary sinus tenderness. Right sinus exhibits no frontal sinus tenderness. Left sinus exhibits maxillary sinus tenderness. Left sinus exhibits no frontal sinus tenderness.  Mouth/Throat: Uvula is midline and mucous membranes are normal. Posterior oropharyngeal erythema present. No oropharyngeal exudate.  Eyes: Conjunctivae and EOM are normal. Pupils are equal, round, and reactive to light.  Neck: Normal range of motion. Neck supple.  Cardiovascular: Normal rate, regular rhythm and normal heart sounds.   Pulmonary/Chest: Effort normal and breath sounds normal. No respiratory distress. She has no wheezes.  Lymphadenopathy:    She has no cervical adenopathy.          Assessment & Plan:

## 2013-11-07 NOTE — Assessment & Plan Note (Signed)
Refill klonopin

## 2013-11-07 NOTE — Patient Instructions (Signed)
Follow up in 4-6 weeks to recheck BP Start the Amoxicillin twice daily as directed- take w/ food Drink plenty of fluids REST! Use the cough syrup for night, cough pills for daytime Call with any questions or concerns Hang in there! Happy Holidays!!

## 2013-11-07 NOTE — Assessment & Plan Note (Signed)
New.  Pt's sxs and PE consistent w/ infxn.  Start abx.  Cough meds prn.  Reviewed supportive care and red flags that should prompt return.  Pt expressed understanding and is in agreement w/ plan.  

## 2014-01-17 ENCOUNTER — Other Ambulatory Visit: Payer: Self-pay | Admitting: Family Medicine

## 2014-01-17 NOTE — Telephone Encounter (Signed)
Med filled.  

## 2014-01-25 ENCOUNTER — Ambulatory Visit (INDEPENDENT_AMBULATORY_CARE_PROVIDER_SITE_OTHER): Payer: BC Managed Care – PPO | Admitting: Nurse Practitioner

## 2014-01-25 ENCOUNTER — Encounter: Payer: Self-pay | Admitting: Nurse Practitioner

## 2014-01-25 ENCOUNTER — Encounter: Payer: Self-pay | Admitting: *Deleted

## 2014-01-25 VITALS — BP 107/73 | HR 75 | Temp 98.4°F | Ht 68.75 in | Wt 197.4 lb

## 2014-01-25 DIAGNOSIS — J111 Influenza due to unidentified influenza virus with other respiratory manifestations: Secondary | ICD-10-CM

## 2014-01-25 NOTE — Patient Instructions (Signed)
You likely have flu. The average duration is 5-10 days. Treatment is largely symptom management.  For sinus congestion, start daily sinus rinses (neilmed Sinus Rinse) & 30 mg to 60 mg pseudoephedrine twice daily.  For sore throat use benzocaine throat lozenges or spray.  For aches & fever alternate tylenol & ibuprophen every 4-6 hours.  For cough, you may use a spoonful of honey thinned with lemon juice or hot tea, or benzonatate capsules as prescribed. Sip fluids every hour. Rest. If you are not feeling better in 1 week or develop fever or chest pain, call us for re-evaluation. Feel better!  Influenza A (H1N1) H1N1 formerly called "swine flu" is a new influenza virus causing sickness in people. The H1N1 virus is different from seasonal influenza viruses. However, the H1N1 symptoms are similar to seasonal influenza and it is spread from person to person. You may be at higher risk for serious problems if you have underlying serious medical conditions. The CDC and the World Health Organization are following reported cases around the world. CAUSES   The flu is thought to spread mainly person-to-person through coughing or sneezing of infected people.  A person may become infected by touching something with the virus on it and then touching their mouth or nose. SYMPTOMS   Fever.  Headache.  Tiredness.  Cough.  Sore throat.  Runny or stuffy nose.  Body aches.  Diarrhea and vomiting These symptoms are referred to as "flu-like symptoms." A lot of different illnesses, including the common cold, may have similar symptoms. DIAGNOSIS   There are tests that can tell if you have the H1N1 virus.  Confirmed cases of H1N1 will be reported to the state or local health department.  A doctor's exam may be needed to tell whether you have an infection that is a complication of the flu. HOME CARE INSTRUCTIONS   Stay informed. Visit the CDC website for current recommendations. Visit  www.cdc.gov/H1N1flu/. You may also call 1-800-CDC-INFO (1-800-232-4636).  Get help early if you develop any of the above symptoms.  If you are at high risk from complications of the flu, talk to your caregiver as soon as you develop flu-like symptoms. Those at higher risk for complications include:  People 65 years or older.  People with chronic medical conditions.  Pregnant women.  Young children.  Your caregiver may recommend antiviral medicine to help treat the flu.  If you get the flu, get plenty of rest, drink enough water and fluids to keep your urine clear or pale yellow, and avoid using alcohol or tobacco.  You may take over-the-counter medicine to relieve the symptoms of the flu if your caregiver approves. (Never give aspirin to children or teenagers who have flu-like symptoms, particularly fever). TREATMENT  If you do get sick, antiviral drugs are available. These drugs can make your illness milder and make you feel better faster. Treatment should start soon after illness starts. It is only effective if taken within the first day of becoming ill. Only your caregiver can prescribe antiviral medication.  PREVENTION   Cover your nose and mouth with a tissue or your arm when you cough or sneeze. Throw the tissue away.  Wash your hands often with soap and warm water, especially after you cough or sneeze. Alcohol-based cleaners are also effective against germs.  Avoid touching your eyes, nose or mouth. This is one way germs spread.  Try to avoid contact with sick people. Follow public health advice regarding school closures. Avoid crowds.  Stay   home if you get sick. Limit contact with others to keep from infecting them. People infected with the H1N1 virus may be able to infect others anywhere from 1 day before feeling sick to 5-7 days after getting flu symptoms.  An H1N1 vaccine is available to help protect against the virus. In addition to the H1N1 vaccine, you will need to be  vaccinated for seasonal influenza. The H1N1 and seasonal vaccines may be given on the same day. The CDC especially recommends the H1N1 vaccine for:  Pregnant women.  People who live with or care for children younger than 6 months of age.  Health care and emergency services personnel.  Persons between the ages of 6 months through 24 years of age.  People from ages 25 through 64 years who are at higher risk for H1N1 because of chronic health disorders or immune system problems. FACEMASKS In community and home settings, the use of facemasks and N95 respirators are not normally recommended. In certain circumstances, a facemask or N95 respirator may be used for persons at increased risk of severe illness from influenza. Your caregiver can give additional recommendations for facemask use. IN CHILDREN, EMERGENCY WARNING SIGNS THAT NEED URGENT MEDICAL CARE:  Fast breathing or trouble breathing.  Bluish skin color.  Not drinking enough fluids.  Not waking up or not interacting normally.  Being so fussy that the child does not want to be held.  Your child has an oral temperature above 102 F (38.9 C), not controlled by medicine.  Your baby is older than 3 months with a rectal temperature of 102 F (38.9 C) or higher.  Your baby is 3 months old or younger with a rectal temperature of 100.4 F (38 C) or higher.  Flu-like symptoms improve but then return with fever and worse cough. IN ADULTS, EMERGENCY WARNING SIGNS THAT NEED URGENT MEDICAL CARE:  Difficulty breathing or shortness of breath.  Pain or pressure in the chest or abdomen.  Sudden dizziness.  Confusion.  Severe or persistent vomiting.  Bluish color.  You have a oral temperature above 102 F (38.9 C), not controlled by medicine.  Flu-like symptoms improve but return with fever and worse cough. SEEK IMMEDIATE MEDICAL CARE IF:  You or someone you know is experiencing any of the above symptoms. When you arrive at the  emergency center, report that you think you have the flu. You may be asked to wear a mask and/or sit in a secluded area to protect others from getting sick. MAKE SURE YOU:   Understand these instructions.  Will watch your condition.  Will get help right away if you are not doing well or get worse. Some of this information courtesy of the CDC.  Document Released: 05/12/2008 Document Revised: 02/16/2012 Document Reviewed: 05/12/2008 Exit[employer] Patient Information 2014 [employer]. 

## 2014-01-25 NOTE — Progress Notes (Signed)
   Subjective:    Patient ID: Suzanne Singh, female    DOB: [birth year], 48 y.o.   MRN: [id number]  URI  This is a new problem. The current episode started in the past 7 days (2d. exposed to flu.). The problem has been unchanged. The maximum temperature recorded prior to her arrival was 101 - 101.9 F. The fever has been present for 1 to 2 days. Associated symptoms include congestion, coughing, ear pain, headaches and a sore throat (scratchy). Pertinent negatives include no abdominal pain, chest pain, diarrhea, nausea, swollen glands, vomiting or wheezing. She has tried antihistamine (dayquil) for the symptoms. The treatment provided mild relief.      Review of Systems  Constitutional: Positive for fever and fatigue. Negative for chills, activity change and appetite change.  HENT: Positive for congestion, ear pain and sore throat (scratchy).   Respiratory: Positive for cough. Negative for chest tightness, shortness of breath and wheezing.   Cardiovascular: Negative for chest pain.  Gastrointestinal: Negative for nausea, vomiting, abdominal pain and diarrhea.  Musculoskeletal: Negative for back pain.  Neurological: Positive for headaches.  Hematological: Negative for adenopathy.       Objective:   Physical Exam  Vitals reviewed. Constitutional: She is oriented to person, place, and time. She appears well-developed and well-nourished. No distress.  HENT:  Head: Normocephalic and atraumatic.  Right Ear: External ear normal.  Left Ear: External ear normal.  Mouth/Throat: Oropharynx is clear and moist. No oropharyngeal exudate.  Nasal quality to voice  Eyes: Conjunctivae are normal. Right eye exhibits no discharge. Left eye exhibits no discharge.  Neck: Normal range of motion. Neck supple. No thyromegaly present.  Cardiovascular: Normal rate, regular rhythm and normal heart sounds.   Pulmonary/Chest: Effort normal and breath sounds normal. No respiratory distress. She has no wheezes. She  has no rales.  Lymphadenopathy:    She has no cervical adenopathy.  Neurological: She is alert and oriented to person, place, and time.  Skin: Skin is warm and dry.  Psychiatric: She has a normal mood and affect. Her behavior is normal. Thought content normal.          Assessment & Plan:  1. Influenza Reports fever 101 yesterday, sudden onset, cough, nasal congestion. Declines tamiflu due to cost. See pt instructions.

## 2014-01-25 NOTE — Progress Notes (Signed)
Pre-visit discussion using our clinic review tool. No additional management support is needed unless otherwise documented below in the visit note.  

## 2014-01-26 ENCOUNTER — Telehealth: Payer: Self-pay

## 2014-01-26 ENCOUNTER — Other Ambulatory Visit: Payer: Self-pay | Admitting: Nurse Practitioner

## 2014-01-26 DIAGNOSIS — R05 Cough: Secondary | ICD-10-CM

## 2014-01-26 DIAGNOSIS — R059 Cough, unspecified: Secondary | ICD-10-CM

## 2014-01-26 MED ORDER — BENZONATATE 100 MG PO CAPS: ORAL_CAPSULE | ORAL | Status: DC

## 2014-01-26 NOTE — Telephone Encounter (Signed)
The patient called and stated she is needing an antibiotic due to an ongoing cough.  She stated she had an apt on 2/18, but was not prescribed an antibiotic.    Pt callback - [phone]

## 2014-01-26 NOTE — Telephone Encounter (Signed)
Pt notified of med filled.

## 2014-01-26 NOTE — Telephone Encounter (Signed)
Please advise 

## 2014-01-26 NOTE — Progress Notes (Signed)
Pt requesting ABX because she is coughing. Seen in ofc yesterday w/2 day cough & reported fever. Son treated for flu. Pt declined tamiflu due to cost. Will prescribe benzonatate capsules.

## 2014-01-26 NOTE — Telephone Encounter (Signed)
i have prescribed benzonatate capsules to help with cough. An antibiotic is not going to make a viral cough do away. She will likely cough for several more days. Flu lasts 5-10 days.

## 2014-01-30 ENCOUNTER — Telehealth: Payer: Self-pay | Admitting: *Deleted

## 2014-01-30 ENCOUNTER — Other Ambulatory Visit: Payer: Self-pay | Admitting: Nurse Practitioner

## 2014-01-30 DIAGNOSIS — J069 Acute upper respiratory infection, unspecified: Secondary | ICD-10-CM

## 2014-01-30 MED ORDER — AMOXICILLIN-POT CLAVULANATE 875-125 MG PO TABS: 1.0000 | ORAL_TABLET | Freq: Two times a day (BID) | ORAL | Status: DC

## 2014-01-30 NOTE — Telephone Encounter (Signed)
Pt.notified

## 2014-01-30 NOTE — Telephone Encounter (Signed)
pls call pt: pls let pt know medication has been called in.

## 2014-01-30 NOTE — Telephone Encounter (Signed)
Please advise 

## 2014-01-30 NOTE — Telephone Encounter (Signed)
Patient called and stated that she is still not felling better from visit 01/25/2014. She feels like it has turned into a sinus infection. Also she is experiencing ear pain and coughing up mucus. Patient would like for an antibiotic to be called in for her.  [address] [facility] [address]

## 2014-02-25 IMAGING — MG MM DIGITAL SCREENING BILAT
5 series · 5 of 5 positions shown · non-contrast
Comparison: Previous exams.

CLINICAL DATA: Screening.

DIGITAL BILATERAL SCREENING MAMMOGRAM WITH CAD

[R CC]
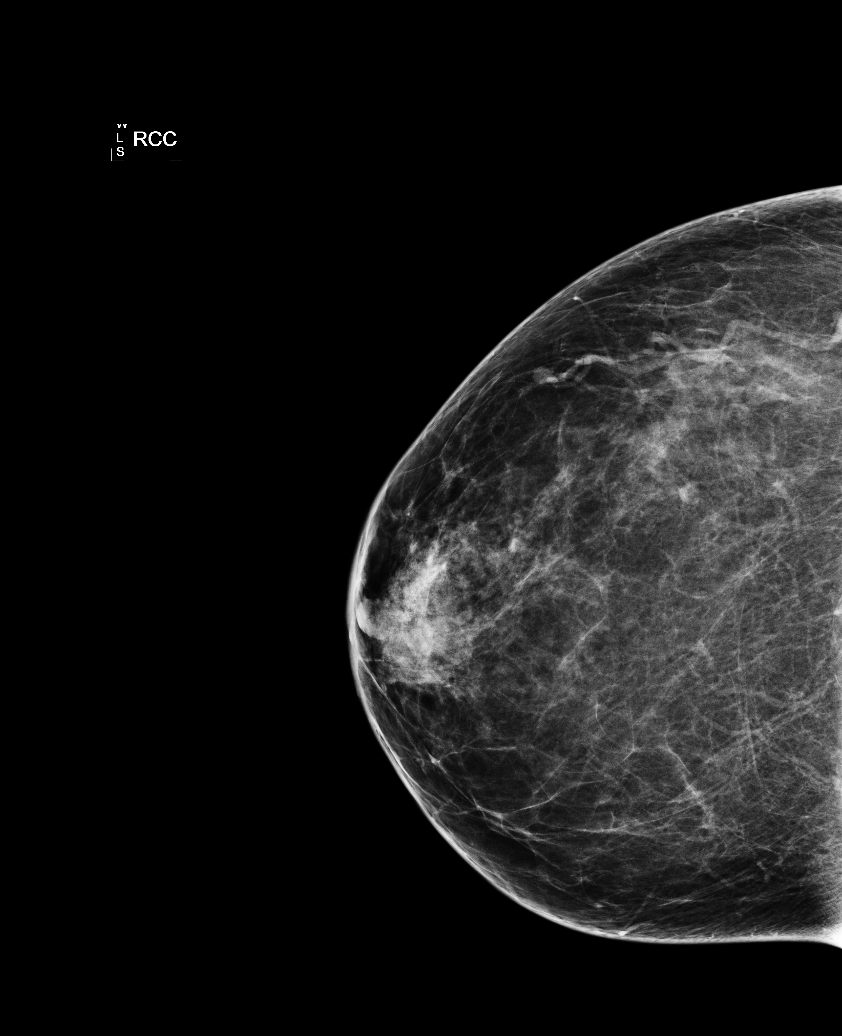

[R MLO]
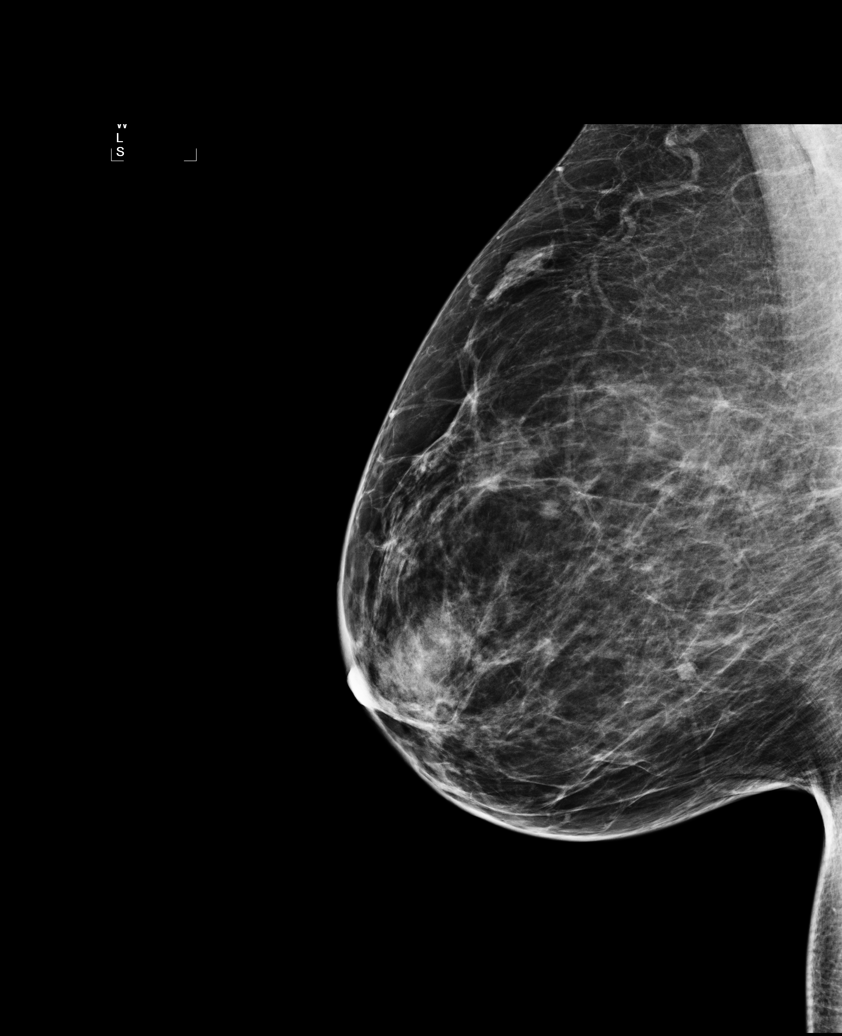

[L CC]
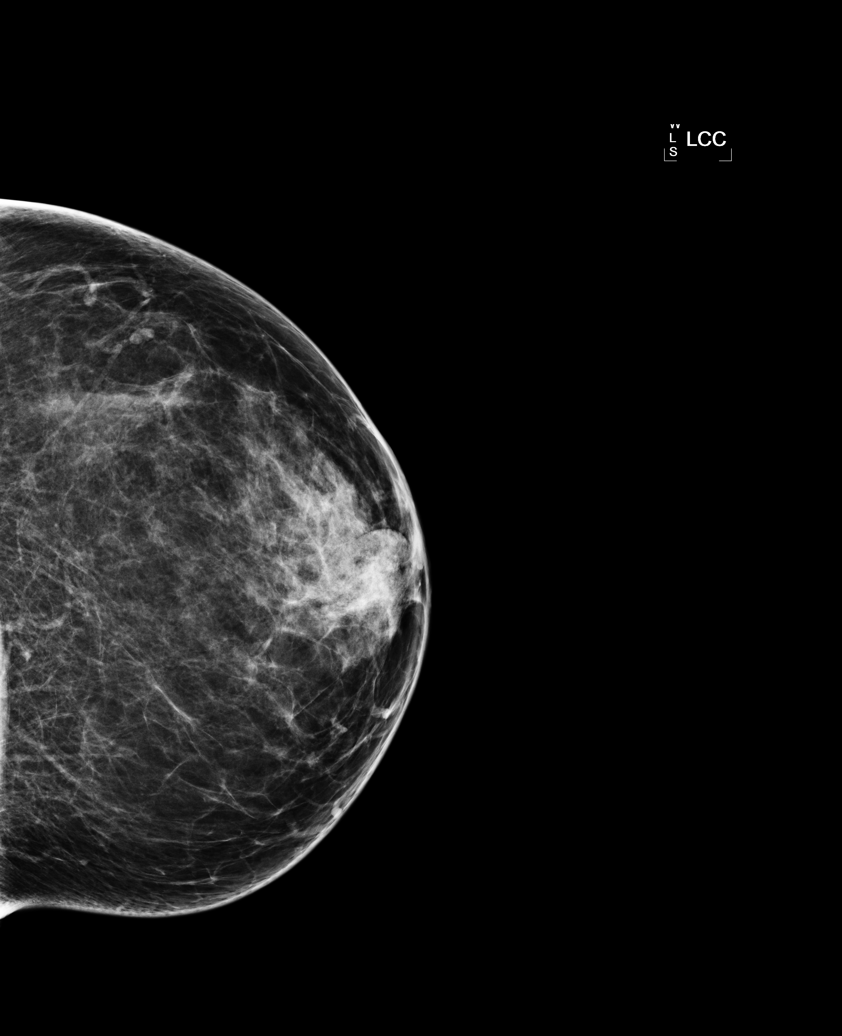

[L MLO (1 of 2)]
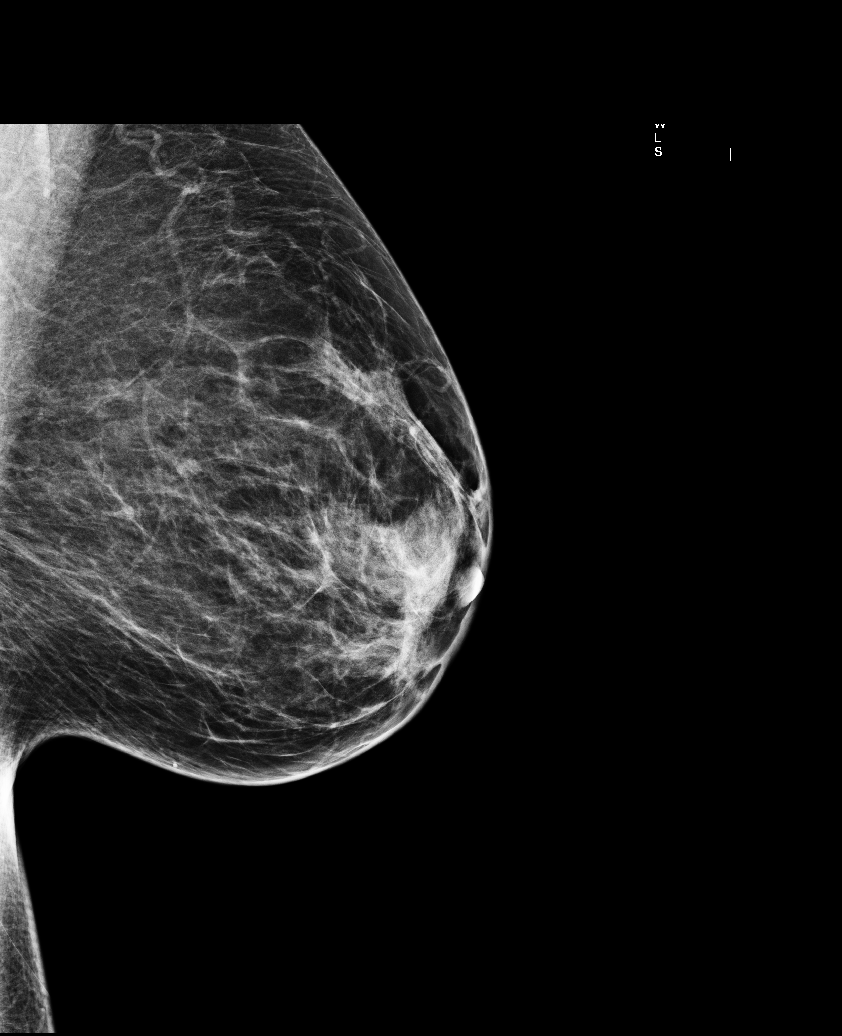

[L MLO (2 of 2)]
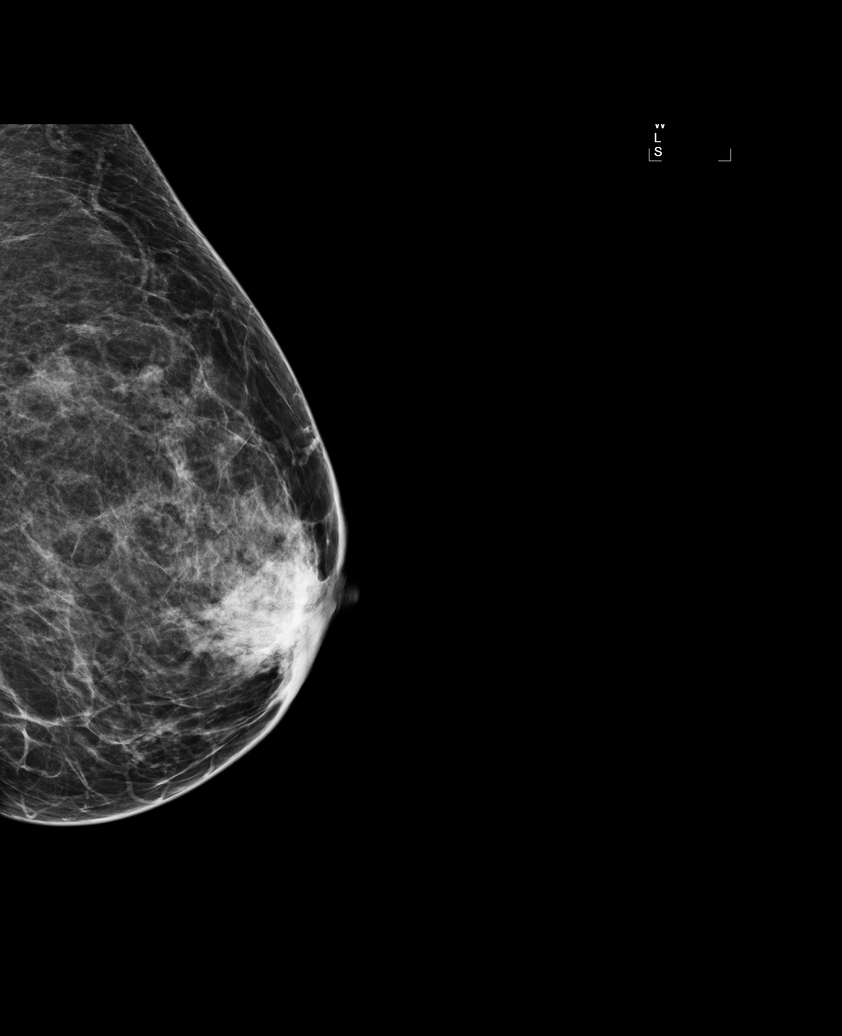

[5 of 5 positions shown; findings below may reference images not displayed]

FINDINGS: There are scattered fibroglandular densities. No
suspicious masses, architectural distortion, or calcifications are
present.

Images were processed with CAD.
IMPRESSION: No mammographic evidence of malignancy.

A result letter of this screening mammogram will be mailed directly
to the patient.

RECOMMENDATION:
Screening mammogram in one year. (Code:FV-[id number])

BI-RADS CATEGORY 1:  Negative.

## 2014-04-07 ENCOUNTER — Ambulatory Visit (INDEPENDENT_AMBULATORY_CARE_PROVIDER_SITE_OTHER): Payer: BC Managed Care – PPO | Admitting: Family Medicine

## 2014-04-07 ENCOUNTER — Encounter: Payer: Self-pay | Admitting: Family Medicine

## 2014-04-07 VITALS — BP 114/72 | HR 89 | Temp 98.2°F | Resp 17 | Wt 199.4 lb

## 2014-04-07 DIAGNOSIS — F341 Dysthymic disorder: Secondary | ICD-10-CM

## 2014-04-07 DIAGNOSIS — R609 Edema, unspecified: Secondary | ICD-10-CM

## 2014-04-07 DIAGNOSIS — F419 Anxiety disorder, unspecified: Secondary | ICD-10-CM

## 2014-04-07 DIAGNOSIS — F329 Major depressive disorder, single episode, unspecified: Secondary | ICD-10-CM

## 2014-04-07 DIAGNOSIS — J320 Chronic maxillary sinusitis: Secondary | ICD-10-CM

## 2014-04-07 LAB — BASIC METABOLIC PANEL
BUN: 10 mg/dL (ref 6–23)
CHLORIDE: 101 meq/L (ref 96–112)
CO2: 26 mEq/L (ref 19–32)
CREATININE: 0.72 mg/dL (ref 0.50–1.10)
Calcium: 9.6 mg/dL (ref 8.4–10.5)
Glucose, Bld: 80 mg/dL (ref 70–99)
POTASSIUM: 4.1 meq/L (ref 3.5–5.3)
Sodium: 136 mEq/L (ref 135–145)

## 2014-04-07 MED ORDER — PROMETHAZINE-DM 6.25-15 MG/5ML PO SYRP: 5.0000 mL | ORAL_SOLUTION | Freq: Four times a day (QID) | ORAL | Status: DC | PRN

## 2014-04-07 MED ORDER — AMOXICILLIN 875 MG PO TABS: 875.0000 mg | ORAL_TABLET | Freq: Two times a day (BID) | ORAL | Status: DC

## 2014-04-07 MED ORDER — VENLAFAXINE HCL ER 75 MG PO CP24: 75.0000 mg | ORAL_CAPSULE | Freq: Every day | ORAL | Status: DC

## 2014-04-07 NOTE — Progress Notes (Signed)
Pre visit review using our clinic review tool, if applicable. No additional management support is needed unless otherwise documented below in the visit note. 

## 2014-04-07 NOTE — Patient Instructions (Signed)
Follow up in 4-6 weeks to recheck mood We'll notify you of your lab results and make any changes if needed STOP the Prozac START the Effexor daily Take the Amoxicillin for the sinusitis Use the cough syrup as needed Continue Claritin/Zyrtec/Allegra for the allergy component Drink plenty of fluids REST! Tylenol as needed for arthritis Call with any questions or concerns Hang in there!

## 2014-04-07 NOTE — Progress Notes (Signed)
   Subjective:    Patient ID: Suzanne Singh, female    DOB: 1966-09-10, 48 y.o.   MRN: [id number]  HPI Anxiety/depression- chronic problem, pt reports she doesn't feel that the medication is not working any more.  Increased fatigue, decreased motivation.  Divorce is now final, has to change jobs.  'i'm not sad about it, just kind of burnt out'.    URI- sxs started 5 days ago.  No fever.  + cough- intermittently productive, nasal congestion, HA, PND.  R ear pressure.  Taking Allegra w/o relief.  'i know it's an infection now.  + sick contacts.  Edema- pt reports intermittent swelling.  States she has 'an underdeveloped kidney' (no record of this and not noted on CT scan done in 2007).  Asking for BMP.   Review of Systems For ROS see HPI     Objective:   Physical Exam  Vitals reviewed. Constitutional: She appears well-developed and well-nourished. No distress.  HENT:  Head: Normocephalic and atraumatic.  Right Ear: Tympanic membrane normal.  Left Ear: Tympanic membrane normal.  Nose: Mucosal edema and rhinorrhea present. Right sinus exhibits maxillary sinus tenderness. Right sinus exhibits no frontal sinus tenderness. Left sinus exhibits maxillary sinus tenderness. Left sinus exhibits no frontal sinus tenderness.  Mouth/Throat: Uvula is midline and mucous membranes are normal. Posterior oropharyngeal erythema present. No oropharyngeal exudate.  Eyes: Conjunctivae and EOM are normal. Pupils are equal, round, and reactive to light.  Neck: Normal range of motion. Neck supple.  Cardiovascular: Normal rate, regular rhythm and normal heart sounds.   Pulmonary/Chest: Effort normal and breath sounds normal. No respiratory distress. She has no wheezes.  Lymphadenopathy:    She has no cervical adenopathy.          Assessment & Plan:

## 2014-04-09 NOTE — Assessment & Plan Note (Signed)
New per pt report- no evidence on PE today.  Check BMP given reported hx of atrophic kidney.

## 2014-04-09 NOTE — Assessment & Plan Note (Signed)
Deteriorated.  sxs not well controlled on Prozac.  Switch to effexor and monitor closely for improvement.  Pt expressed understanding and is in agreement w/ plan.

## 2014-04-09 NOTE — Assessment & Plan Note (Signed)
Pt's sxs and PE consistent w/ infxn.  Start abx.  Cough meds prn.  Reviewed supportive care and red flags that should prompt return.  Pt expressed understanding and is in agreement w/ plan.  

## 2014-06-12 ENCOUNTER — Telehealth: Payer: Self-pay | Admitting: Family Medicine

## 2014-06-12 DIAGNOSIS — Z01419 Encounter for gynecological examination (general) (routine) without abnormal findings: Secondary | ICD-10-CM

## 2014-06-12 DIAGNOSIS — M79673 Pain in unspecified foot: Secondary | ICD-10-CM

## 2014-06-12 NOTE — Telephone Encounter (Signed)
Referrals ordered.  Pt is aware.

## 2014-06-12 NOTE — Telephone Encounter (Signed)
Ok to refer to GYN (well woman exam) and podiatry (foot pain)

## 2014-06-12 NOTE — Telephone Encounter (Signed)
Last OV:  04/07/14 Last PE: 07/28/13, Last PAP- 07/22/12-normal  Ok to place referrals?

## 2014-06-12 NOTE — Telephone Encounter (Signed)
Caller name: Bronson IngYvette Dern Relation to pt: patient Call back number: [phone] Pharmacy:  Reason for call: to get a referral to a GYN and a Podiatrist. Patient states that she has ankle pain and swollen feet. Please advise.

## 2014-06-16 ENCOUNTER — Ambulatory Visit: Payer: BC Managed Care – PPO | Admitting: Family Medicine

## 2014-06-16 ENCOUNTER — Other Ambulatory Visit: Payer: Self-pay | Admitting: Family Medicine

## 2014-06-16 DIAGNOSIS — Z1231 Encounter for screening mammogram for malignant neoplasm of breast: Secondary | ICD-10-CM

## 2014-06-21 ENCOUNTER — Encounter: Payer: Self-pay | Admitting: Family Medicine

## 2014-06-21 ENCOUNTER — Ambulatory Visit (INDEPENDENT_AMBULATORY_CARE_PROVIDER_SITE_OTHER): Payer: BC Managed Care – PPO | Admitting: Family Medicine

## 2014-06-21 VITALS — BP 124/74 | HR 82 | Temp 98.0°F | Resp 16 | Wt 205.4 lb

## 2014-06-21 DIAGNOSIS — F329 Major depressive disorder, single episode, unspecified: Secondary | ICD-10-CM

## 2014-06-21 DIAGNOSIS — F419 Anxiety disorder, unspecified: Secondary | ICD-10-CM

## 2014-06-21 DIAGNOSIS — G4700 Insomnia, unspecified: Secondary | ICD-10-CM

## 2014-06-21 DIAGNOSIS — E669 Obesity, unspecified: Secondary | ICD-10-CM | POA: Insufficient documentation

## 2014-06-21 DIAGNOSIS — F32A Depression, unspecified: Secondary | ICD-10-CM

## 2014-06-21 DIAGNOSIS — F341 Dysthymic disorder: Secondary | ICD-10-CM

## 2014-06-21 MED ORDER — SUVOREXANT 15 MG PO TABS: 1.0000 | ORAL_TABLET | Freq: Every evening | ORAL | Status: DC | PRN

## 2014-06-21 NOTE — Progress Notes (Signed)
   Subjective:    Patient ID: Suzanne Singh, female    DOB: 1966/08/12, 48 y.o.   MRN: [id number]  HPI Anxiety/depression- chronic problem, pt was switched from Prozac to Effexor at last visit.  Pt reports decreased energy and fatigue continue.  Pt reports she will 'go to sleep but not stay asleep'.  Pt is worried about weight gain.  GYN mentioned starting Contrave.  Pt has hx of seizures on Cymbalta and Ambien.  Pt is walking regularly but 'it's really painful'.    Review of Systems For ROS see HPI     Objective:   Physical Exam  Vitals reviewed. Constitutional: She is oriented to person, place, and time. She appears well-developed and well-nourished. No distress.  HENT:  Head: Normocephalic and atraumatic.  Neurological: She is alert and oriented to person, place, and time.  Psychiatric: Her behavior is normal. Thought content normal.  Flat affect          Assessment & Plan:

## 2014-06-21 NOTE — Assessment & Plan Note (Signed)
Unchanged.  Pt continues to have fatigue and decreased energy/motivation despite switching from Prozac to Effexor.  Suspect a lot of pt's fatigue is due to poor sleep.  Will attempt to treat insomnia w/ Belsomra.  Will hold on increasing SNRI due to hx of previous seizure.  Will follow.

## 2014-06-21 NOTE — Patient Instructions (Signed)
Follow up in 6 weeks to recheck mood and sleep Continue the Effexor at 75mg  daily Start the Belsomra nightly for sleep Call with any questions or concerns Hang in there!!!

## 2014-06-21 NOTE — Progress Notes (Signed)
Pre visit review using our clinic review tool, if applicable. No additional management support is needed unless otherwise documented below in the visit note. 

## 2014-06-21 NOTE — Assessment & Plan Note (Signed)
New.  Pt's GYN had mentioned Contrave as possible option but this contains Wellbutrin that can lower seizure threshold (particularly in someone who has already had a seizure).  Previous seizure is contraindication and I discussed w/ pt that I would not be comfortable prescribing this for her.  Pt in agreement.  Will follow her weight loss progress.

## 2014-06-21 NOTE — Assessment & Plan Note (Signed)
Chronic problem.  Pt has failed trazodone in the past- found it to be ineffective.  Had seizures on Ambien.  Will attempt to treat w/ Belsomra as pt's biggest issues isn't falling asleep- it's staying asleep.  Will follow.

## 2014-06-26 ENCOUNTER — Other Ambulatory Visit: Payer: Self-pay | Admitting: Podiatry

## 2014-06-26 DIAGNOSIS — M79672 Pain in left foot: Secondary | ICD-10-CM

## 2014-06-26 DIAGNOSIS — M7750 Other enthesopathy of unspecified foot: Secondary | ICD-10-CM

## 2014-06-30 ENCOUNTER — Other Ambulatory Visit: Payer: BC Managed Care – PPO

## 2014-07-10 ENCOUNTER — Telehealth: Payer: Self-pay | Admitting: Family Medicine

## 2014-07-10 NOTE — Telephone Encounter (Signed)
FYI

## 2014-07-10 NOTE — Telephone Encounter (Signed)
Patient Information:  Caller Name: Bronson IngYvette  Phone: [phone]  Patient: Suzanne Singh, Suzanne Singh  Gender: Female  DOB: [birth year]  Age: 48 Years  PCP: Sheliah Hatchabori, Katherine E.  Pregnant: No  Office Follow Up:  Does the office need to follow up with this patient?: No  Instructions For The Office: N/A  RN Note:  Appointment not scheduled due to conflict with pt's schedule; will call back tomorrow if severe itching persists.  Symptoms  Reason For Call & Symptoms: Onset 07/09/2014 stuck by several mosquitoes with redness, swelling, and severe itching.  Reviewed Health History In EMR: Yes  Reviewed Medications In EMR: Yes  Reviewed Allergies In EMR: Yes  Reviewed Surgeries / Procedures: Yes  Date of Onset of Symptoms: 07/08/2014  Treatments Tried: Benadryl 10 ml at 0730  Treatments Tried Worked: No OB / GYN:  LMP: 06/13/2014  Guideline(s) Used:  Insect Bite  Mosquito Bite  Disposition Per Guideline:   See Within 3 Days in Office  Reason For Disposition Reached:   Severe local itching (i.e., interferes with work, school, sleep) and not improved after 24 hours of hydrocortisone cream  Advice Given:  Reassurance  In [place], mosquito bites are usually just an annoyance, causing very itchy red skin bumps.  Here is some care advice that should help.  Don  Try not to scratch.  Itching is often worsened by scratching (the "Itch-Scratch" cycle).  Cut the fingernails short. (Reason: prevent secondary bacterial infection.)  Four Simple Home Remedies for Itching:  Rub a bite with an ice cube for 30 seconds.  Try applying firm, direct, steady pressure to a bite for 10 seconds. A fingernail, pen cap, or other object can be used.  Apply calamine lotion to the bites.  Make baking soda paste by mixing baking soda with a small amount of water. Apply the paste to the bites.  Hydrocortisone Cream for Itching:   Apply hydrocortisone cream 4 times a day. Use it for a couple days, until  the itch is mild.  Available OTC in [place] as 0.5% and 1% cream.  Available OTC in [place] as 0.5% cream.  Oral Antihistamine Medication for Severe Itching:  Do not take antihistamine medications if you have prostate problems.  Antihistamines may cause sleepiness. Do not drink, drive, or operate dangerous machinery while taking antihistamines.  An over-the-counter antihistamine that causes less sleepiness is loratadine (e.g., Alavert or Claritin).  Read the package instructions thoroughly on all medications that you take.  Expected Course:  Most mosquito bites itch for several days.  Any pinkness or redness usually lasts 2-3 days.  Mosquito bites of the upper face can sometimes cause marked swelling around the eye, especially in children. This is harmless.  Call Back If  Looks infected (e.g., spreading redness, red streak, pus)  Severe itching persists over 1-2 days on treatment  You become worse.  Patient Will Follow Care Advice:  YES

## 2014-07-11 ENCOUNTER — Ambulatory Visit: Payer: Self-pay | Admitting: Medical

## 2014-07-20 ENCOUNTER — Other Ambulatory Visit: Payer: Self-pay | Admitting: Family Medicine

## 2014-07-20 NOTE — Telephone Encounter (Signed)
Med filled.  

## 2014-07-21 ENCOUNTER — Ambulatory Visit (HOSPITAL_COMMUNITY)
Admission: RE | Admit: 2014-07-21 | Discharge: 2014-07-21 | Disposition: A | Discharge status: Home / Self Care | Payer: BC Managed Care – PPO | Source: Ambulatory Visit | Attending: Family Medicine | Admitting: Family Medicine

## 2014-07-21 DIAGNOSIS — Z1231 Encounter for screening mammogram for malignant neoplasm of breast: Secondary | ICD-10-CM | POA: Diagnosis present

## 2014-08-02 ENCOUNTER — Ambulatory Visit: Payer: BC Managed Care – PPO | Admitting: Family Medicine

## 2014-08-06 ENCOUNTER — Other Ambulatory Visit: Payer: Self-pay | Admitting: Family Medicine

## 2014-08-07 NOTE — Telephone Encounter (Signed)
Med filled.  

## 2014-08-18 ENCOUNTER — Ambulatory Visit (INDEPENDENT_AMBULATORY_CARE_PROVIDER_SITE_OTHER): Payer: BC Managed Care – PPO | Admitting: Emergency Medicine

## 2014-08-18 ENCOUNTER — Ambulatory Visit (INDEPENDENT_AMBULATORY_CARE_PROVIDER_SITE_OTHER): Payer: BC Managed Care – PPO

## 2014-08-18 VITALS — BP 114/78 | HR 69 | Temp 97.8°F | Resp 16 | Ht 69.0 in | Wt 211.0 lb

## 2014-08-18 DIAGNOSIS — M79609 Pain in unspecified limb: Secondary | ICD-10-CM

## 2014-08-18 DIAGNOSIS — M79671 Pain in right foot: Secondary | ICD-10-CM

## 2014-08-18 MED ORDER — TRAMADOL HCL 50 MG PO TABS: 50.0000 mg | ORAL_TABLET | Freq: Three times a day (TID) | ORAL | Status: DC | PRN

## 2014-08-18 NOTE — Progress Notes (Signed)
   Subjective:  This chart was scribed for Lesle Chris, MD by Marica Otter, ED Scribe. This patient was seen in room 12 and the patient's care was started at 6:49 PM.      Patient ID: Suzanne Singh, female    DOB: 09-Sep-1966, 48 y.o.   MRN: [id number]  HPI HPI Comments: Suzanne Singh is a 48 y.o. female, with medical Hx noted below, who presents to the [facility] complaining of injury to her right foot sustained earlier today. Pt reports that she dropped an iron on her right foot earlier today and now she complains of associated pain. Pt specifies that she was barefoot when the iron fell on her foot. Pt describes the pain as a shooting pain up her leg and reports she is having difficulty putting any weight on said leg due to the pain.   Pt denies any possibility of pregnancy.   Past Medical History  Diagnosis Date  . Eating disorder   . Arthritis   . Depression   . Migraine   . Seizures   . GERD (gastroesophageal reflux disease)   . Fibromyalgia   . IBS (irritable bowel syndrome)   . Arrhythmia      Review of Systems  Constitutional: Negative for fever and chills.  Musculoskeletal:       Right foot pain   Psychiatric/Behavioral: Negative for confusion.       Objective:   Physical Exam  CONSTITUTIONAL: Well developed/well nourished HEAD: Normocephalic/atraumatic EYES: EOMI/PERRL ENMT: Mucous membranes moist NECK: supple no meningeal signs SPINE:entire spine nontender CV: S1/S2 noted, no murmurs/rubs/gallops noted LUNGS: Lungs are clear to auscultation bilaterally, no apparent distress ABDOMEN: soft, nontender, no rebound or guarding GU:no cva tenderness NEURO: Pt is awake/alert, moves all extremitiesx4 EXTREMITIES: pulses normal, full ROM; diffused swelling and tenderness over the dorsum on the foot and no edema or tenderness of the ankle or the toes.  SKIN: warm, color normal PSYCH: no abnormalities of mood noted   [facility] reading (PRIMARY) by  Dr.  Cleta Alberts there is a linear calcification between the first and second cuneiforms. Please comment as to whether you feel like this is an acute fracture.  Assessment & Plan:  We'll try a short cam boot along with crutches. She had itching with hydrocodone so we'll use Ultram for pain. The radiologist felt the x-ray findings were do to a summation of shadows rather than a true fracture.

## 2014-08-18 NOTE — Patient Instructions (Signed)
Cuneiform Fracture, Simple Cuneiform Fracture (simple) is a break (fracture) of one of your cuneiform bones. This is one of the bones located in the middle of your foot. When fractures are small and not out of place, they may be treated conservatively. This means that only a cast is needed for treatment. At first, no walking may be allowed, but following a period of time, your caregiver may allow you to have a walking cast. DIAGNOSIS  The diagnosis of a fractured cuneiform is made by X-ray. X-rays may be required before and after the injury is put into a splint or cast. HOME CARE INSTRUCTIONS   Only take over-the-counter or prescription medicines for pain, discomfort or fever as directed by your caregiver.  If you have a splint held on with an elastic wrap and your foot or toes become numb or cold and blue, loosen the wrap and reapply more loosely. See your caregiver if there is no relief.  Use your injured foot as directed. Do not drive a vehicle until your caregiver specifically tells you it is safe to do so.  WARNING: See your caregiver as directed. It is important to keep all follow-up appointments in order to provide the best opportunity for healing and to avoid disability and chronic pain. SEEK IMMEDIATE MEDICAL CARE IF:   There is swelling or increasing pain in foot.  You begin to lose feeling in your foot or toes, or develop swelling of the foot or toes.  The foot or toes on the injured side are blue or cold.  You develop pain, which is not controlled by the medications. MAKE SURE YOU:   Understand these instructions.  Will watch your condition.  Will get help right away if you are not doing well or get worse. Document Released: 08/16/2002 Document Revised: 02/16/2012 Document Reviewed: 06/29/2008 ExitCare Patient Information 2015 [employer]. This information is not intended to replace advice given to you by your health care provider. Make sure you discuss any questions you  have with your health care provider.   

## 2014-09-21 ENCOUNTER — Ambulatory Visit (INDEPENDENT_AMBULATORY_CARE_PROVIDER_SITE_OTHER): Payer: BC Managed Care – PPO | Admitting: Family Medicine

## 2014-09-21 ENCOUNTER — Encounter: Payer: Self-pay | Admitting: Family Medicine

## 2014-09-21 VITALS — BP 118/80 | HR 82 | Temp 98.3°F | Resp 16 | Ht 68.5 in | Wt 212.4 lb

## 2014-09-21 DIAGNOSIS — E010 Iodine-deficiency related diffuse (endemic) goiter: Secondary | ICD-10-CM | POA: Insufficient documentation

## 2014-09-21 DIAGNOSIS — M25562 Pain in left knee: Secondary | ICD-10-CM

## 2014-09-21 DIAGNOSIS — Z23 Encounter for immunization: Secondary | ICD-10-CM

## 2014-09-21 DIAGNOSIS — Z0000 Encounter for general adult medical examination without abnormal findings: Secondary | ICD-10-CM

## 2014-09-21 DIAGNOSIS — F329 Major depressive disorder, single episode, unspecified: Secondary | ICD-10-CM

## 2014-09-21 DIAGNOSIS — E049 Nontoxic goiter, unspecified: Secondary | ICD-10-CM

## 2014-09-21 DIAGNOSIS — M25561 Pain in right knee: Secondary | ICD-10-CM | POA: Insufficient documentation

## 2014-09-21 DIAGNOSIS — F419 Anxiety disorder, unspecified: Secondary | ICD-10-CM

## 2014-09-21 DIAGNOSIS — F418 Other specified anxiety disorders: Secondary | ICD-10-CM

## 2014-09-21 DIAGNOSIS — F32A Depression, unspecified: Secondary | ICD-10-CM

## 2014-09-21 MED ORDER — VENLAFAXINE HCL ER 37.5 MG PO CP24: 37.5000 mg | ORAL_CAPSULE | Freq: Every day | ORAL | Status: DC

## 2014-09-21 MED ORDER — CYCLOBENZAPRINE HCL 10 MG PO TABS: 10.0000 mg | ORAL_TABLET | Freq: Three times a day (TID) | ORAL | Status: DC | PRN

## 2014-09-21 NOTE — Progress Notes (Signed)
   Subjective:    Patient ID: Suzanne Singh, female    DOB: [birth year], 48 y.o.   MRN: [id number]  HPI CPE- UTD on mammo, pap (summer 2015- [facility]), too young for colonoscopy.  Knee pain- L, sxs started 2 weeks ago after pt had to wear boot on R foot.  Pain is in anterior and posterior knee, 'like a pulling sensation'.  Will have swelling by the end of the day.  Icing.  OTC ibuprofen 2-3x/day 600mg .    Depression- pt interested in weaning off meds.  Doesn't find it effective.   Review of Systems Patient reports no vision/ hearing changes, adenopathy,fever, weight change,  persistant/recurrent hoarseness , swallowing issues, chest pain, palpitations, edema, persistant/recurrent cough, hemoptysis, dyspnea (rest/exertional/paroxysmal nocturnal), gastrointestinal bleeding (melena, rectal bleeding), abdominal pain, significant heartburn, bowel changes, GU symptoms (dysuria, hematuria, incontinence), Gyn symptoms (abnormal  bleeding, pain), syncope, focal weakness, memory loss, numbness & tingling, skin/hair/nail changes, abnormal bruising or bleeding.     Objective:   Physical Exam General Appearance:    Alert, cooperative, no distress, appears stated age  Head:    Normocephalic, without obvious abnormality, atraumatic  Eyes:    PERRL, conjunctiva/corneas clear, EOM's intact, fundi    benign, both eyes  Ears:    Normal TM's and external ear canals, both ears  Nose:   Nares normal, septum midline, mucosa normal, no drainage    or sinus tenderness  Throat:   Lips, mucosa, and tongue normal; teeth and gums normal  Neck:   Supple, symmetrical, trachea midline, no adenopathy;    Thyroid: diffuse enlargement w/o nodularity  Back:     Symmetric, no curvature, ROM normal, no CVA tenderness  Lungs:     Clear to auscultation bilaterally, respirations unlabored  Chest Wall:    No tenderness or deformity   Heart:    Regular rate and rhythm, S1 and S2 normal, no murmur, rub   or gallop    Breast Exam:    Deferred to GYN  Abdomen:     Soft, non-tender, bowel sounds active all four quadrants,    no masses, no organomegaly  Genitalia:    Deferred to GYN  Rectal:    Extremities:   Extremities normal, atraumatic, no cyanosis or edema.  + TTP along L knee medial joint line, + crepitus on flexion/extension   Pulses:   2+ and symmetric all extremities  Skin:   Skin color, texture, turgor normal, no rashes or lesions  Lymph nodes:   Cervical, supraclavicular, and axillary nodes normal  Neurologic:   CNII-XII intact, normal strength, sensation and reflexes    throughout          Assessment & Plan:

## 2014-09-21 NOTE — Assessment & Plan Note (Signed)
New.  Pt's pain and the TTP along medial joint line along w/ crepitus, concerning for degerative changes.  Start daily NSAID.  Refer to ortho.  Reviewed supportive care and red flags that should prompt return.  Pt expressed understanding and is in agreement w/ plan.

## 2014-09-21 NOTE — Assessment & Plan Note (Signed)
Pt feels meds are causing weight gain.  Would like to wean off meds.  Weaning instructions provided (see instructions).  Pt expressed understanding and is in agreement w/ plan.

## 2014-09-21 NOTE — Progress Notes (Signed)
Pre visit review using our clinic review tool, if applicable. No additional management support is needed unless otherwise documented below in the visit note. 

## 2014-09-21 NOTE — Assessment & Plan Note (Signed)
New.  Get US to assess.  Check labs.

## 2014-09-21 NOTE — Patient Instructions (Signed)
Follow up in 6-8 weeks for anxiety/depression Start the lower dose of Effexor 37.5mg  daily x2 weeks (or more) and then decrease to every other day x2 weeks (or more) and then stop We'll call you with your Sports Med appt for the knee.  Continue anti-inflammatories and ice We'll notify you of your lab results and make any changes if needed Try and make healthy food choices and get regular exercise- it's a great stress outlet Call with any questions or concerns Hang in there!  You're doing great!

## 2014-09-21 NOTE — Assessment & Plan Note (Signed)
Pt's PE WNL w/ exception of L knee and thyromegaly.  UTD on GYN.  Check labs.  Anticipatory guidance provided.

## 2014-09-22 LAB — HEPATIC FUNCTION PANEL
ALT: 12 U/L (ref 0–35)
AST: 23 U/L (ref 0–37)
Albumin: 3.4 g/dL — ABNORMAL LOW (ref 3.5–5.2)
Alkaline Phosphatase: 76 U/L (ref 39–117)
BILIRUBIN DIRECT: 0.1 mg/dL (ref 0.0–0.3)
TOTAL PROTEIN: 7.5 g/dL (ref 6.0–8.3)
Total Bilirubin: 0.6 mg/dL (ref 0.2–1.2)

## 2014-09-22 LAB — LIPID PANEL
CHOL/HDL RATIO: 3
Cholesterol: 178 mg/dL (ref 0–200)
HDL: 54.9 mg/dL (ref >39.00)
LDL Cholesterol: 117 mg/dL — ABNORMAL HIGH (ref 0–99)
NonHDL: 123.1
TRIGLYCERIDES: 33 mg/dL (ref 0.0–149.0)
VLDL: 6.6 mg/dL (ref 0.0–40.0)

## 2014-09-22 LAB — VITAMIN D 25 HYDROXY (VIT D DEFICIENCY, FRACTURES): VITD: 35.27 ng/mL (ref 30.00–100.00)

## 2014-09-22 LAB — CBC WITH DIFFERENTIAL/PLATELET
BASOS ABS: 0 10*3/uL (ref 0.0–0.1)
BASOS PCT: 0.3 % (ref 0.0–3.0)
EOS ABS: 0.1 10*3/uL (ref 0.0–0.7)
Eosinophils Relative: 1.2 % (ref 0.0–5.0)
HCT: 36.2 % (ref 36.0–46.0)
Hemoglobin: 11.6 g/dL — ABNORMAL LOW (ref 12.0–15.0)
LYMPHS PCT: 39.8 % (ref 12.0–46.0)
Lymphs Abs: 1.8 10*3/uL (ref 0.7–4.0)
MCHC: 32.1 g/dL (ref 30.0–36.0)
MCV: 91.5 fl (ref 78.0–100.0)
Monocytes Absolute: 0.4 10*3/uL (ref 0.1–1.0)
Monocytes Relative: 9.8 % (ref 3.0–12.0)
NEUTROS PCT: 48.9 % (ref 43.0–77.0)
Neutro Abs: 2.2 10*3/uL (ref 1.4–7.7)
PLATELETS: 288 10*3/uL (ref 150.0–400.0)
RBC: 3.96 Mil/uL (ref 3.87–5.11)
RDW: 13.6 % (ref 11.5–15.5)
WBC: 4.4 10*3/uL (ref 4.0–10.5)

## 2014-09-22 LAB — BASIC METABOLIC PANEL
BUN: 12 mg/dL (ref 6–23)
CALCIUM: 9.3 mg/dL (ref 8.4–10.5)
CO2: 25 meq/L (ref 19–32)
Chloride: 103 mEq/L (ref 96–112)
Creatinine, Ser: 0.8 mg/dL (ref 0.4–1.2)
GFR: 105.04 mL/min (ref >60.00)
Glucose, Bld: 81 mg/dL (ref 70–99)
Potassium: 4 mEq/L (ref 3.5–5.1)
Sodium: 137 mEq/L (ref 135–145)

## 2014-09-22 LAB — TSH: TSH: 0.85 u[IU]/mL (ref 0.35–4.50)

## 2014-09-26 ENCOUNTER — Ambulatory Visit: Payer: BC Managed Care – PPO | Admitting: Family Medicine

## 2014-10-03 ENCOUNTER — Encounter: Payer: Self-pay | Admitting: Family Medicine

## 2014-10-03 ENCOUNTER — Ambulatory Visit (INDEPENDENT_AMBULATORY_CARE_PROVIDER_SITE_OTHER): Payer: BC Managed Care – PPO | Admitting: Family Medicine

## 2014-10-03 VITALS — BP 115/76 | HR 84 | Ht 70.0 in | Wt 200.0 lb

## 2014-10-03 DIAGNOSIS — M25562 Pain in left knee: Secondary | ICD-10-CM

## 2014-10-03 MED ORDER — METHYLPREDNISOLONE ACETATE 40 MG/ML IJ SUSP
40.0000 mg | Freq: Once | INTRAMUSCULAR | Status: AC
  Administered 2014-10-03: 40 mg via INTRA_ARTICULAR

## 2014-10-03 NOTE — Patient Instructions (Addendum)
Take tylenol 500mg  1-2 tabs three times a day for pain. Aleve 1-2 tabs twice a day with food Glucosamine sulfate 750mg  twice a day is a supplement that may help. Capsaicin topically up to four times a day may also help with pain. Cortisone injections are an option. If cortisone injections do not help, there are different types of shots that may help but they take longer to take effect. It's important that you continue to stay active. Straight leg raises, knee extensions 3 sets of 10 once a day (add ankle weight if these become too easy). Consider physical therapy to strengthen muscles around the joint that hurts to take pressure off of the joint itself. Shoe inserts with good arch support may be helpful. Walker or cane if needed. Heat or ice 15 minutes at a time 3-4 times a day as needed to help with pain. Water aerobics and cycling with low resistance are the best two types of exercise for arthritis. Follow up with me as needed.

## 2014-10-05 ENCOUNTER — Encounter: Payer: Self-pay | Admitting: Family Medicine

## 2014-10-05 NOTE — Assessment & Plan Note (Signed)
2/2 mild DJD.  Discussed tylenol, aleve, glucosamine, capsaicin.  Intraarticular injection given today.  Consider physical therapy as well.   Heat or ice.  F/u prn.  After informed written consent, patient was seated on exam table. Left knee was prepped with alcohol swab and utilizing anteromedial approach, patient's left knee was injected intraarticularly with 3:1 marcaine: depomedrol. Patient tolerated the procedure well without immediate complications.

## 2014-10-05 NOTE — Progress Notes (Signed)
Patient ID: Suzanne Singh, female   DOB: 01/24/1966, 48 y.o.   MRN: [id number]  PCP and referred by: Neena RhymesKatherine Tabori, MD  Subjective:   HPI: Patient is a 48 y.o. female here for left knee pain.  Patient reports she's been having pain in both knees, left more than right for about a month. Pain anterior and posterior. Thinks this flared up more from having to wear cam walker for left foot fracture around that timje. Has been icing and using heat. On feet a lot at school. Did give out on her one day. Tried voltaren gel, muscle relaxant, herbal rubs. Wearing compression socks. No catching, locking. X-rays 5 years ago were normal.  Past Medical History  Diagnosis Date  . Eating disorder   . Arthritis   . Depression   . Migraine   . Seizures   . GERD (gastroesophageal reflux disease)   . Fibromyalgia   . IBS (irritable bowel syndrome)   . Arrhythmia     Current Outpatient Prescriptions on File Prior to Visit  Medication Sig Dispense Refill  . clonazePAM (KLONOPIN) 0.5 MG tablet TAKE ONE-HALF TO ONE TABLET BY MOUTH EVERY DAY AS NEEDED  30 tablet  0  . cyclobenzaprine (FLEXERIL) 10 MG tablet Take 1 tablet (10 mg total) by mouth 3 (three) times daily as needed for muscle spasms.  30 tablet  0  . traMADol (ULTRAM) 50 MG tablet Take 1 tablet (50 mg total) by mouth every 8 (eight) hours as needed.  30 tablet  0  . venlafaxine XR (EFFEXOR-XR) 37.5 MG 24 hr capsule Take 1 capsule (37.5 mg total) by mouth daily with breakfast.  30 capsule  3   No current facility-administered medications on file prior to visit.    History reviewed. No pertinent past surgical history.  Allergies  Allergen Reactions  . Ambien Cr [Zolpidem Tartrate Er]     Seizure   . Cymbalta [Duloxetine Hcl]     Seizure   . Celebrex [Celecoxib]     Hives     History   Social History  . Marital Status: Married    Spouse Name: N/A    Number of Children: 2  . Years of Education: N/A   Occupational History   .     Social History Main Topics  . Smoking status: Never Smoker   . Smokeless tobacco: Never Used  . Alcohol Use: No     Comment: wine on occasion  . Drug Use: No  . Sexual Activity: Not on file   Other Topics Concern  . Not on file   Social History Narrative   Middle school English teacher, [employer]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   2 sons    Family History  Problem Relation Age of Onset  . Alcohol abuse Mother   . Arthritis Mother   . Lung cancer Mother   . Hypertension Mother   . Mental illness Mother   . Alcohol abuse Father   . Heart disease Father   . Hypertension Father   . Diabetes Father   . Colon cancer Father   . Early death Sister   . Arthritis Maternal Grandmother   . Hypertension Maternal Grandmother   . Breast cancer Maternal Grandmother   . Stroke Maternal Grandfather   . Hypertension Maternal Grandfather   . Heart disease Paternal Grandmother   . Hypertension Paternal Grandmother   . Colon cancer Paternal Grandfather   . Heart disease Paternal Grandfather   .  Hypertension Paternal Grandfather   . Kidney disease Paternal Grandfather   . Diabetes Paternal Grandfather     BP 115/76  Pulse 84  Ht 5\' 10"  (1.778 m)  Wt 200 lb (90.719 kg)  BMI 28.70 kg/m2  Review of Systems: See HPI above.    Objective:  Physical Exam:  Gen: NAD  Left knee: No gross deformity, ecchymoses, effusion. TTP medial joint line.  No other tenderness. FROM. Negative ant/post drawers. Negative valgus/varus testing. Negative lachmanns. Negative mcmurrays, apleys, patellar apprehension. NV intact distally.    Assessment & Plan:  1. Left knee pain - 2/2 mild DJD.  Discussed tylenol, aleve, glucosamine, capsaicin.  Intraarticular injection given today.  Consider physical therapy as well.   Heat or ice.  F/u prn.  After informed written consent, patient was seated on exam table. Left knee was prepped with alcohol  swab and utilizing anteromedial approach, patient's left knee was injected intraarticularly with 3:1 marcaine: depomedrol. Patient tolerated the procedure well without immediate complications.

## 2014-11-16 ENCOUNTER — Ambulatory Visit (INDEPENDENT_AMBULATORY_CARE_PROVIDER_SITE_OTHER): Payer: BC Managed Care – PPO | Admitting: Family Medicine

## 2014-11-16 ENCOUNTER — Encounter: Payer: Self-pay | Admitting: Family Medicine

## 2014-11-16 VITALS — BP 112/80 | HR 90 | Temp 98.3°F | Resp 16 | Wt 215.4 lb

## 2014-11-16 DIAGNOSIS — R635 Abnormal weight gain: Secondary | ICD-10-CM

## 2014-11-16 DIAGNOSIS — G4700 Insomnia, unspecified: Secondary | ICD-10-CM

## 2014-11-16 DIAGNOSIS — N390 Urinary tract infection, site not specified: Secondary | ICD-10-CM | POA: Insufficient documentation

## 2014-11-16 DIAGNOSIS — N3000 Acute cystitis without hematuria: Secondary | ICD-10-CM

## 2014-11-16 DIAGNOSIS — R82998 Other abnormal findings in urine: Secondary | ICD-10-CM

## 2014-11-16 DIAGNOSIS — R300 Dysuria: Secondary | ICD-10-CM

## 2014-11-16 LAB — POCT URINALYSIS DIPSTICK
Bilirubin, UA: NEGATIVE
Blood, UA: NEGATIVE
Glucose, UA: NEGATIVE
Ketones, UA: NEGATIVE
Nitrite, UA: NEGATIVE
PH UA: 6.5
PROTEIN UA: NEGATIVE
Spec Grav, UA: <=1.005
UROBILINOGEN UA: 0.2

## 2014-11-16 MED ORDER — CEPHALEXIN 500 MG PO CAPS: 500.0000 mg | ORAL_CAPSULE | Freq: Two times a day (BID) | ORAL | Status: AC

## 2014-11-16 MED ORDER — SUVOREXANT 10 MG PO TABS: 1.0000 | ORAL_TABLET | Freq: Every day | ORAL | Status: DC

## 2014-11-16 NOTE — Patient Instructions (Addendum)
Follow up in 3 months to recheck weight Start the Keflex for the UTI Continue to make healthy food choices and get regular exercise Continue to drink plenty of fluids Start the Belsomra for sleep Call with any questions or concerns Hang in there! Happy Holidays!

## 2014-11-16 NOTE — Progress Notes (Signed)
Pre visit review using our clinic review tool, if applicable. No additional management support is needed unless otherwise documented below in the visit note. 

## 2014-11-16 NOTE — Progress Notes (Signed)
   Subjective:    Patient ID: Suzanne Singh, female    DOB: 1966/02/29, 48 y.o.   MRN: [id number]  HPI Anxiety/depression- pt has weaned off Effexor.  Now having increased 'eye jumping'.  Not sleeping well.  Pt doesn't report feeling anxious or depressed but admits to 'running ragged'.  Pt did not do well on Trazodone due to hang over feeling.  Pt reports that taking the Clonazepam prior to bed doesn't keep her asleep.  Had seizure on Ambien/Wellbutrin.  Pt tried Belsomra sample w/ some fatigue in AM.  UTI- sxs started 2 weeks ago.  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  No burning w/ urination.  + frequency.  + urgency.  + hesitancy.  Some suprapubic pressure/pain.  Weight gain- pt is 'very concerned' about her weight.  Pt just resumed exercising after knee injxn.  Diet has not changed.  Recent labs from CPE were all normal.  Pt reports gaining ~20 lbs in last year.   Review of Systems For ROS see HPI     Objective:   Physical Exam  Constitutional: She is oriented to person, place, and time. She appears well-developed and well-nourished. No distress.  Cardiovascular: Normal rate, regular rhythm and normal heart sounds.   Pulmonary/Chest: Effort normal and breath sounds normal. No respiratory distress. She has no wheezes. She has no rales.  Abdominal: Soft. She exhibits no distension. There is no tenderness (no suprapubic or CVA tenderness).  Neurological: She is alert and oriented to person, place, and time.  Skin: Skin is warm and dry.  Psychiatric: She has a normal mood and affect. Her behavior is normal. Thought content normal.  Vitals reviewed.         Assessment & Plan:

## 2014-11-18 LAB — URINE CULTURE
COLONY COUNT: NO GROWTH
Organism ID, Bacteria: NO GROWTH

## 2014-11-19 NOTE — Assessment & Plan Note (Signed)
New.  Start empiric tx while awaiting culture results.  Encouraged pt to increase water intake and void as needed to avoid similar sxs in the future.

## 2014-11-19 NOTE — Assessment & Plan Note (Signed)
Chronic problem for pt.  Has tried various sleep meds w/o success.  Suspect this is due to untreated anxiety/depression but pt is against taking medication for mood at this time.  Will try Belsomra- prescription voucher and coupon card given.

## 2014-11-19 NOTE — Assessment & Plan Note (Signed)
Pt is upset about her recent weight gain but has been unable to exercise due to knee pain.  Just recently resumed activity.  Stressed need for healthy diet.  Discussed that this is likely due to peri-menopause.  Will follow.

## 2014-12-06 ENCOUNTER — Other Ambulatory Visit: Payer: Self-pay | Admitting: Family Medicine

## 2014-12-06 NOTE — Telephone Encounter (Signed)
Med filled.  

## 2015-01-12 ENCOUNTER — Encounter: Payer: Self-pay | Admitting: Medical

## 2015-01-12 ENCOUNTER — Ambulatory Visit (INDEPENDENT_AMBULATORY_CARE_PROVIDER_SITE_OTHER): Payer: BC Managed Care – PPO | Admitting: Medical

## 2015-01-12 ENCOUNTER — Ambulatory Visit: Payer: BC Managed Care – PPO | Admitting: Medical

## 2015-01-12 VITALS — BP 114/80 | HR 84 | Temp 98.1°F | Ht 70.0 in | Wt 219.2 lb

## 2015-01-12 DIAGNOSIS — N644 Mastodynia: Secondary | ICD-10-CM | POA: Insufficient documentation

## 2015-01-12 DIAGNOSIS — L089 Local infection of the skin and subcutaneous tissue, unspecified: Secondary | ICD-10-CM

## 2015-01-12 MED ORDER — CEPHALEXIN 500 MG PO CAPS: 500.0000 mg | ORAL_CAPSULE | Freq: Three times a day (TID) | ORAL | Status: DC

## 2015-01-12 NOTE — Progress Notes (Signed)
Subjective:    Patient ID: Suzanne Singh, female    DOB: 1966/02/16, 49 y.o.   MRN: [id number]  HPI   Pt in with some rt breast pain. She states started with some mild pain/achingon outside lateral edge of the breast. Some pain nipple. Pain progressively getting worse. Pain decreased with ibuprofen for short. Pain for 4 days. Today worse than other days. Pain enough to keep her up.   Last menstrual cycle January 25th.   Pt family has fibrocystic breast. Pt never had fibrocystic disease. Pt had mammogram in august. Was negative for any malgnancy/nothing suspicious.  No discharge from nipple. Pt thinks some slight color change redness  to breast with warmth.       Review of Systems  Constitutional: Negative for fever, chills and fatigue.  Cardiovascular: Negative for chest pain and palpitations.  Gastrointestinal: Negative for abdominal pain.  Musculoskeletal: Negative for back pain.  Skin: Positive for color change.       Rt breast painful. Pt report faint reddish color with warmth and tenderness.  Hematological: Negative for adenopathy. Does not bruise/bleed easily.   Past Medical History  Diagnosis Date  . Eating disorder   . Arthritis   . Depression   . Migraine   . Seizures   . GERD (gastroesophageal reflux disease)   . Fibromyalgia   . IBS (irritable bowel syndrome)   . Arrhythmia     History   Social History  . Marital Status: Married    Spouse Name: N/A    Number of Children: 2  . Years of Education: N/A   Occupational History  .     Social History Main Topics  . Smoking status: Never Smoker   . Smokeless tobacco: Never Used  . Alcohol Use: No     Comment: wine on occasion  . Drug Use: No  . Sexual Activity: Not on file   Other Topics Concern  . 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   2 sons    No past surgical  history on file.  Family History  Problem Relation Age of Onset  . Alcohol abuse Mother   . Arthritis Mother   . Lung cancer Mother   . Hypertension Mother   . Mental illness Mother   . Alcohol abuse Father   . Heart disease Father   . Hypertension Father   . Diabetes Father   . Colon cancer Father   . Early death Sister   . Arthritis Maternal Grandmother   . Hypertension Maternal Grandmother   . Breast cancer Maternal Grandmother   . Stroke Maternal Grandfather   . Hypertension Maternal Grandfather   . Heart disease Paternal Grandmother   . Hypertension Paternal Grandmother   . Colon cancer Paternal Grandfather   . Heart disease Paternal Grandfather   . Hypertension Paternal Grandfather   . Kidney disease Paternal Grandfather   . Diabetes Paternal Grandfather     Allergies  Allergen Reactions  . Ambien Cr [Zolpidem Tartrate Er]     Seizure   . Cymbalta [Duloxetine Hcl]     Seizure   . Celebrex [Celecoxib]     Hives     Current Outpatient Prescriptions on File Prior to Visit  Medication Sig Dispense Refill  . clonazePAM (KLONOPIN) 0.5 MG tablet TAKE ONE-HALF TO ONE TABLET BY MOUTH EVERY DAY AS NEEDED 30 tablet 0  .  cyclobenzaprine (FLEXERIL) 10 MG tablet TAKE 1 TABLET BY MOUTH THREE TIMES DAILY AS NEEDED FOR MUSCLE SPASMS 30 tablet 0  . traMADol (ULTRAM) 50 MG tablet Take 1 tablet (50 mg total) by mouth every 8 (eight) hours as needed. 30 tablet 0  . Suvorexant (BELSOMRA) 10 MG TABS Take 1 tablet by mouth daily. (Patient not taking: Reported on 01/12/2015) 30 tablet 3  . venlafaxine XR (EFFEXOR-XR) 37.5 MG 24 hr capsule Take 1 capsule (37.5 mg total) by mouth daily with breakfast. (Patient not taking: Reported on 01/12/2015) 30 capsule 3   No current facility-administered medications on file prior to visit.    BP 114/80 mmHg  Pulse 84  Temp(Src) 98.1 F (36.7 C) (Oral)  Ht  (1.778 m)  Wt 219 lb 3.2 oz (99.428 kg)  BMI 31.45 kg/m2  SpO2 100%  LMP  01/01/2015       Objective:   Physical Exam   General- No acute distress. Pleasant patient. Neck- Full range of motion, no jvd Lungs- Clear, even and unlabored. Heart- regular rate and rhythm. Neurologic- CNII- XII grossly intact. Breast- symmetric. No breakdown of skin. No ulcers. No nipple discharge. Axillary areas free from lymphadenopathy. No masses or lumps(maybe mild fibrocystic changes both sides). Rt breast nipple is tender. Rt of nipple at edge of areola faint pinkish appearance.Very faint warmth and also tender.         Assessment & Plan:

## 2015-01-12 NOTE — Assessment & Plan Note (Addendum)
This is not definite but with faint pinkish appearance to skin, faint warmth and tenderness, I will give you antibiotic cephalexin. It is important to follow up in 7 days or sooner if needed.( If area were to get redder, warmer or tender).

## 2015-01-12 NOTE — Patient Instructions (Addendum)
Skin infection This is not definite but with faint pinkish appearance to skin, faint warmth and tenderness, I will give you antibiotic cephalexin. It is important to follow up in 7 days or sooner if needed.( If area were to get redder, warmer or tender).   Breast pain May be from early infection vs fibrocystic disease. You can take alleve twice daily)(Avoid any caffeine). It is extremely important that we follow up and recheck the area. Even though imaging studies/mammogram were negative if area not back to normal would consider re-imaging US or mammogram.       Follow up in 7-10 days or as needed.   Fibrocystic Breast Changes Fibrocystic breast changes occur when breast ducts become blocked, causing painful, fluid-filled lumps (cysts) to form in the breast. This is a common condition that is noncancerous (benign). It occurs when women go through hormonal changes during their menstrual cycle. Fibrocystic breast changes can affect one or both breasts. CAUSES  The exact cause of fibrocystic breast changes is not known, but it may be related to the female hormones estrogen and progesterone. Family traits that get passed from parent to child (genetics) may also be a factor in some cases. SIGNS AND SYMPTOMS   Tenderness, mild discomfort, or pain.   Swelling.   Ropelike feeling when touching the breast.   Lumpy breast, one or both sides.   Changes in breast size, especially before (larger) and after (smaller) the menstrual period.   Green or dark brown nipple discharge (not blood).  Symptoms are usually worse before menstrual periods start and get better toward the end of the menstrual period.  DIAGNOSIS  To make a diagnosis, your health care provider will ask you questions and perform a physical exam of your breasts. The health care provider may recommend other tests that can examine inside your breasts, such as:  A breast X-ray (mammogram).   Ultrasonography.  An MRI.  If  something more than fibrocystic breast changes is suspected, your health care provider may take a breast tissue sample (breast biopsy) to examine. TREATMENT  Often, treatment is not needed. Your health care provider may recommend over-the-counter pain relievers to help lessen pain or discomfort caused by the fibrocystic breast changes. You may also be asked to change your diet to limit or stop eating foods or drinking beverages that contain caffeine. Foods and beverages that contain caffeine include chocolate, soda, coffee, and tea. Reducing sugar and fat in your diet may also help. Your health care provider may also recommend:  Fine needle aspiration to remove fluid from a cyst that is causing pain.   Surgery to remove a large, persistent, and tender cyst. HOME CARE INSTRUCTIONS   Examine your breasts after every menstrual period. If you do not have menstrual periods, check your breasts the first day of every month. Feel for changes, such as more tenderness, a new growth, a change in breast size, or a change in a lump that has always been there.   Only take over-the-counter or prescription medicine as directed by your health care provider.   Wear a well-fitted support or sports bra, especially when exercising.   Decrease or avoid caffeine, fat, and sugar in your diet as directed by your health care provider.  SEEK MEDICAL CARE IF:   You have fluid leaking (discharge) from your nipples, especially bloody discharge.   You have new lumps or bumps in the breast.   Your breast or breasts become enlarged, red, and painful.   You have  areas of your breast that pucker in.   Your nipples appear flat or indented.  Document Released: 09/10/2006 Document Revised: 11/29/2013 Document Reviewed: 05/15/2013 [facility] Patient Information 2015 [place], [place]. This information is not intended to replace advice given to you by your health care provider. Make sure you discuss any questions you have  with your health care provider.

## 2015-01-12 NOTE — Assessment & Plan Note (Signed)
May be from early infection vs fibrocystic disease. You can take alleve twice daily)(Avoid any caffeine). It is extremely important that we follow up and recheck the area. Even though imaging studies/mammogram were negative if area not back to normal would consider re-imaging US or mammogram.

## 2015-01-12 NOTE — Progress Notes (Signed)
Pre visit review using our clinic review tool, if applicable. No additional management support is needed unless otherwise documented below in the visit note. 

## 2015-05-28 ENCOUNTER — Other Ambulatory Visit: Payer: Self-pay | Admitting: Family Medicine

## 2015-05-28 NOTE — Telephone Encounter (Signed)
Last OV 11-16-14 Clonazepam last filled 07-20-14 #30 with 0  CSC, Low Risk

## 2015-05-29 NOTE — Telephone Encounter (Signed)
Med filled and faxed.  

## 2015-06-27 ENCOUNTER — Ambulatory Visit: Payer: BC Managed Care – PPO | Admitting: Family Medicine

## 2015-06-27 ENCOUNTER — Other Ambulatory Visit: Payer: Self-pay | Admitting: Family Medicine

## 2015-06-28 ENCOUNTER — Encounter: Payer: Self-pay | Admitting: Family Medicine

## 2015-06-28 ENCOUNTER — Ambulatory Visit (INDEPENDENT_AMBULATORY_CARE_PROVIDER_SITE_OTHER): Payer: BC Managed Care – PPO | Admitting: Family Medicine

## 2015-06-28 VITALS — BP 122/80 | HR 80 | Temp 98.1°F | Resp 16 | Wt 219.2 lb

## 2015-06-28 DIAGNOSIS — M25512 Pain in left shoulder: Secondary | ICD-10-CM

## 2015-06-28 DIAGNOSIS — M79671 Pain in right foot: Secondary | ICD-10-CM | POA: Diagnosis not present

## 2015-06-28 MED ORDER — TRAMADOL HCL 50 MG PO TABS: 50.0000 mg | ORAL_TABLET | Freq: Three times a day (TID) | ORAL | Status: DC | PRN

## 2015-06-28 MED ORDER — PREDNISONE 10 MG PO TABS: ORAL_TABLET | ORAL | Status: DC

## 2015-06-28 NOTE — Progress Notes (Signed)
   Subjective:    Patient ID: Suzanne Singh, female    DOB: Oct 23, 1966, 49 y.o.   MRN: [id number]  HPI L arm pain- 'last night it was on fire'.  Applied ice and heat w/ some improvement.  Unable to forward flex or abduct arm w/o severe pain.  Pt was power washing yesterday and did some lifting.  There was no pop or tear or sudden injury.    No hx of shoulder pain or injury.  Hx of allergy to celebrex and difficulty w/ NSAIDs.   Review of Systems For ROS see HPI     Objective:   Physical Exam  Constitutional: She is oriented to person, place, and time. She appears well-developed and well-nourished. No distress.  HENT:  Head: Normocephalic and atraumatic.  Neck: Normal range of motion.  Cardiovascular: Intact distal pulses.   Musculoskeletal: She exhibits tenderness (TTP over L trap, + spasm). She exhibits no edema.  Pain and limited forward flexion of L shoulder and abduction.  Pain localized to head of biceps tendon  Neurological: She is alert and oriented to person, place, and time. She has normal reflexes. Coordination normal.  Skin: Skin is warm and dry. No erythema.  Psychiatric: She has a normal mood and affect. Her behavior is normal. Thought content normal.  Vitals reviewed.         Assessment & Plan:

## 2015-06-28 NOTE — Patient Instructions (Signed)
Follow up by phone or MyChart in 2 weeks if no improvement Start the Prednisone as directed- take w/ food Use the Tramadol as needed for pain Continue the Flexeril at bed for the spasm Continue to alternate ice/heat Gentle range of motion exercises to avoid stiffness Call with any questions or concerns Hang in there!!!

## 2015-06-28 NOTE — Progress Notes (Signed)
Pre visit review using our clinic review tool, if applicable. No additional management support is needed unless otherwise documented below in the visit note. 

## 2015-06-28 NOTE — Telephone Encounter (Signed)
Med filled.  

## 2015-06-28 NOTE — Assessment & Plan Note (Addendum)
New.  Pt w/ apparent overuse injury with either tendonitis or biceps strain.  Pt is intolerant to NSAIDs- will start Prednisone for relief of inflammation.  Flexeril as needed for trap spasm.  Tramadol prn for severe pain.  Alternate heat/ice.  Reviewed gentle stretching to avoid frozen shoulder.  Reviewed supportive care and red flags that should prompt return. Pt expressed understanding and is in agreement w/ plan.

## 2015-07-06 ENCOUNTER — Other Ambulatory Visit: Payer: Self-pay | Admitting: Family Medicine

## 2015-07-06 DIAGNOSIS — Z1231 Encounter for screening mammogram for malignant neoplasm of breast: Secondary | ICD-10-CM

## 2015-07-11 ENCOUNTER — Other Ambulatory Visit: Payer: Self-pay

## 2015-07-12 LAB — CYTOLOGY - PAP

## 2015-07-26 ENCOUNTER — Ambulatory Visit (HOSPITAL_COMMUNITY)
Admission: RE | Admit: 2015-07-26 | Discharge: 2015-07-26 | Disposition: A | Discharge status: Home / Self Care | Payer: BC Managed Care – PPO | Source: Ambulatory Visit | Attending: Family Medicine | Admitting: Family Medicine

## 2015-07-26 ENCOUNTER — Other Ambulatory Visit: Payer: Self-pay | Admitting: Family Medicine

## 2015-07-26 DIAGNOSIS — Z1231 Encounter for screening mammogram for malignant neoplasm of breast: Secondary | ICD-10-CM | POA: Diagnosis present

## 2015-08-04 ENCOUNTER — Ambulatory Visit (INDEPENDENT_AMBULATORY_CARE_PROVIDER_SITE_OTHER): Payer: BC Managed Care – PPO

## 2015-08-04 ENCOUNTER — Ambulatory Visit (INDEPENDENT_AMBULATORY_CARE_PROVIDER_SITE_OTHER): Payer: BC Managed Care – PPO | Admitting: Physician Assistant

## 2015-08-04 VITALS — BP 138/92 | HR 93 | Temp 98.5°F | Ht 69.0 in | Wt 216.8 lb

## 2015-08-04 DIAGNOSIS — M25562 Pain in left knee: Secondary | ICD-10-CM

## 2015-08-04 MED ORDER — HYDROCODONE-ACETAMINOPHEN 5-325 MG PO TABS: 1.0000 | ORAL_TABLET | Freq: Four times a day (QID) | ORAL | Status: AC | PRN

## 2015-08-04 MED ORDER — MELOXICAM 15 MG PO TABS: 15.0000 mg | ORAL_TABLET | Freq: Every day | ORAL | Status: DC

## 2015-08-04 NOTE — Patient Instructions (Signed)
Please ice the knee 3-4 times per day for 15 minutes.   Compress the knee and keep it elevated as much as possible. Please take medication as prescribed.

## 2015-08-04 NOTE — Progress Notes (Signed)
[facility] [address] [place] [address] [phone]  Date:  08/04/2015   Name:  Suzanne Singh   DOB:  1966-10-24   MRN:  [id number]  PCP:  Neena Rhymes, MD    History of Present Illness:  Suzanne Singh is a 49 y.o. female patient with PMH listed below is here today for left knee pain.  Patient states that it started about 1 week.  As she was moving heavy boxes, she turned and felt a sharp pain in the back of her knee.  Swelling shortly insued that circulated to her medial knee.  It is very painful to apply any weight to the knee.  There is pain with flexion.  She has used ice to ease the pain which has helped.  She denies fever or warmth to the knee.  There is no numbness or tingling.  She also has pain at the lower left side that developed within these short days.  This radiates to her left buttocks.  No incontinence or numbness of groin.   She has a hx of this knee pain and sees Dr. Pearletha Forge of ortho.  Next appt. in 3 days.  He had given her a corticosteroid injection which initially helped.  Dxd mild DJD.  She has taken advil, and flexeril which has not helped the knee pain.  She is concerned as she works as a Runner, broadcasting/film/video, and must ambulate often.      Patient Active Problem List   Diagnosis Date Noted  . Left anterior shoulder pain 06/28/2015  . Skin infection 01/12/2015  . Breast pain 01/12/2015  . UTI (urinary tract infection) 11/16/2014  . Weight gain 11/16/2014  . Left medial knee pain 09/21/2014  . Thyromegaly 09/21/2014  . Obesity (BMI 30-39.9) 06/21/2014  . Edema 04/07/2014  . Maxillary sinusitis 11/07/2013  . Elevated BP 11/07/2013  . Insomnia 07/28/2013  . Epigastric pain 12/06/2012  . Hot flushes, perimenopausal 12/06/2012  . IBS (irritable bowel syndrome) 12/06/2012  . Fatigue 12/06/2012  . Family hx of colon cancer in grandparent 12/06/2012  . Screening for malignant neoplasm of the cervix 07/22/2012  . Routine general medical  examination at a health care facility 07/22/2012  . Anxiety and depression 07/22/2012  . Eating disorder 07/22/2012  . GERD (gastroesophageal reflux disease) 07/22/2012  . Fibromyalgia 07/22/2012    Past Medical History  Diagnosis Date  . Eating disorder   . Arthritis   . Depression   . Migraine   . Seizures   . GERD (gastroesophageal reflux disease)   . Fibromyalgia   . IBS (irritable bowel syndrome)   . Arrhythmia     No past surgical history on file.  Social History  Substance Use Topics  . Smoking status: Never Smoker   . Smokeless tobacco: Never Used  . Alcohol Use: No     Comment: wine on occasion    Family History  Problem Relation Age of Onset  . Alcohol abuse Mother   . Arthritis Mother   . Lung cancer Mother   . Hypertension Mother   . Mental illness Mother   . Alcohol abuse Father   . Heart disease Father   . Hypertension Father   . Diabetes Father   . Colon cancer Father   . Early death Sister   . Arthritis Maternal Grandmother   . Hypertension Maternal Grandmother   . Breast cancer Maternal Grandmother   . Stroke Maternal Grandfather   . Hypertension Maternal Grandfather   .  Heart disease Paternal Grandmother   . Hypertension Paternal Grandmother   . Colon cancer Paternal Grandfather   . Heart disease Paternal Grandfather   . Hypertension Paternal Grandfather   . Kidney disease Paternal Grandfather   . Diabetes Paternal Grandfather     Allergies  Allergen Reactions  . Ambien Cr [Zolpidem Tartrate Er]     Seizure   . Cymbalta [Duloxetine Hcl]     Seizure   . Celebrex [Celecoxib]     Hives     Medication list has been reviewed and updated.  Current Outpatient Prescriptions on File Prior to Visit  Medication Sig Dispense Refill  . clonazePAM (KLONOPIN) 0.5 MG tablet TAKE 1/2 TO 1 TABLET BY MOUTH EVERY DAY AS NEEDED (Patient not taking: Reported on 08/04/2015) 30 tablet 0  . cyclobenzaprine (FLEXERIL) 10 MG tablet TAKE 1 TABLET BY  MOUTH THREE TIMES DAILY AS NEEDED FOR MUSCLE SPASMS (Patient not taking: Reported on 08/04/2015) 30 tablet 0  . predniSONE (DELTASONE) 10 MG tablet 3 tabs x3 days and then 2 tabs x3 days and then 1 tab x3 days.  Take w/ food. (Patient not taking: Reported on 08/04/2015) 18 tablet 0  . traMADol (ULTRAM) 50 MG tablet Take 1 tablet (50 mg total) by mouth every 8 (eight) hours as needed. (Patient not taking: Reported on 08/04/2015) 30 tablet 0   No current facility-administered medications on file prior to visit.    ROS ROS otherwise unremarkable unless listed above.  Physical Examination: BP 138/92 mmHg  Pulse 93  Temp(Src) 98.5 F (36.9 C) (Oral)  Ht 5\' 9"  (1.753 m)  Wt 216 lb 12.8 oz (98.34 kg)  BMI 32.00 kg/m2  SpO2 98%  LMP 06/24/2015 Ideal Body Weight: Weight in (lb) to have BMI = 25: 168.9  Physical Exam  Constitutional: She is oriented to person, place, and time. She appears well-developed and well-nourished. No distress.  HENT:  Head: Normocephalic and atraumatic.  Cardiovascular: Normal rate, regular rhythm, normal heart sounds and intact distal pulses.  Exam reveals no friction rub.   No murmur heard. Pulmonary/Chest: Effort normal and breath sounds normal. No respiratory distress. She has no wheezes.  Musculoskeletal:       Lumbar back: She exhibits decreased range of motion (60 degrees of forward flexon, with pain at that left musculature.  Same pain with right lateral deviation.  ). She exhibits no bony tenderness.  No spinous tenderness.  Tender at the left adjacent musculature of lower lumbar.  Tender with palpation over piriformis.  Negative straight leg raise.   Knee with some medial swelling.  Tender without mass of posterior knee.  Tender over medial joint line.  Negative McMurray.  No laxity.  Negative anterior.      Neurological: She is alert and oriented to person, place, and time.  Skin: Skin is warm and dry. She is not diaphoretic.  Psychiatric: She has a normal  mood and affect. Her behavior is normal.    [facility] reading (PRIMARY) by  Dr. Neva Seat: Medial degenerative changes.    Assessment and Plan: 49 year old female is here today for chief complaint of left knee pain and back pain.   -Advised of back stretches.  -Will hold off with injection as she will see Dr. Pearletha Forge in 3 days.   -Placed in hinge brace for comfort.   -She can continue the flexeril with addition of mobic and norco for her pain at this time. -Advised that she ice elevate and compress the knee at  this time.  -Back stretches and ice 3-4x per day for 15 minutes.    Left knee pain - Plan: DG Knee Complete 4 Views Left, meloxicam (MOBIC) 15 MG tablet, HYDROcodone-acetaminophen (NORCO) 5-325 MG per tablet    Trena Platt, PA-C [facility] 08/04/2015 10:19 AM

## 2015-08-07 ENCOUNTER — Other Ambulatory Visit: Payer: Self-pay | Admitting: Obstetrics and Gynecology

## 2015-08-07 ENCOUNTER — Ambulatory Visit (INDEPENDENT_AMBULATORY_CARE_PROVIDER_SITE_OTHER): Payer: BC Managed Care – PPO | Admitting: Family Medicine

## 2015-08-07 ENCOUNTER — Encounter: Payer: Self-pay | Admitting: Family Medicine

## 2015-08-07 VITALS — BP 126/89 | HR 79 | Ht 70.0 in | Wt 210.0 lb

## 2015-08-07 DIAGNOSIS — M25562 Pain in left knee: Secondary | ICD-10-CM | POA: Diagnosis not present

## 2015-08-07 MED ORDER — METHYLPREDNISOLONE ACETATE 40 MG/ML IJ SUSP
40.0000 mg | Freq: Once | INTRAMUSCULAR | Status: AC
  Administered 2015-08-07: 40 mg via INTRA_ARTICULAR

## 2015-08-08 NOTE — Progress Notes (Signed)
Patient ID: Suzanne Singh, female   DOB: 1965/12/19, 49 y.o.   MRN: [id number]  PCP and referred by: Neena Rhymes, MD  Subjective:   HPI: Patient is a 49 y.o. female here for left knee pain.  10/03/14: Patient reports she's been having pain in both knees, left more than right for about a month. Pain anterior and posterior. Thinks this flared up more from having to wear cam walker for left foot fracture around that timje. Has been icing and using heat. On feet a lot at school. Did give out on her one day. Tried voltaren gel, muscle relaxant, herbal rubs. Wearing compression socks. No catching, locking. X-rays 5 years ago were normal.  08/07/15: Patient reports pain has come back in medial left knee over the past 2 weeks. No new injuries. School just restarted - just moved schools as well. + swelling. Radiates posteriorly. Pain 7/10 level. Also having pain plantar right foot - reviewed plantar fascia instructions.  Past Medical History  Diagnosis Date  . Eating disorder   . Arthritis   . Depression   . Migraine   . Seizures   . GERD (gastroesophageal reflux disease)   . Fibromyalgia   . IBS (irritable bowel syndrome)   . Arrhythmia     Current Outpatient Prescriptions on File Prior to Visit  Medication Sig Dispense Refill  . clonazePAM (KLONOPIN) 0.5 MG tablet TAKE 1/2 TO 1 TABLET BY MOUTH EVERY DAY AS NEEDED (Patient not taking: Reported on 08/04/2015) 30 tablet 0  . cyclobenzaprine (FLEXERIL) 10 MG tablet TAKE 1 TABLET BY MOUTH THREE TIMES DAILY AS NEEDED FOR MUSCLE SPASMS (Patient not taking: Reported on 08/04/2015) 30 tablet 0  . HYDROcodone-acetaminophen (NORCO) 5-325 MG per tablet Take 1 tablet by mouth every 6 (six) hours as needed. 28 tablet 0  . meloxicam (MOBIC) 15 MG tablet Take 1 tablet (15 mg total) by mouth daily. 30 tablet 0  . predniSONE (DELTASONE) 10 MG tablet 3 tabs x3 days and then 2 tabs x3 days and then 1 tab x3 days.  Take w/ food. (Patient not  taking: Reported on 08/04/2015) 18 tablet 0  . traMADol (ULTRAM) 50 MG tablet Take 1 tablet (50 mg total) by mouth every 8 (eight) hours as needed. (Patient not taking: Reported on 08/04/2015) 30 tablet 0   No current facility-administered medications on file prior to visit.    No past surgical history on file.  Allergies  Allergen Reactions  . Ambien Cr [Zolpidem Tartrate Er]     Seizure   . Cymbalta [Duloxetine Hcl]     Seizure   . Celebrex [Celecoxib]     Hives     Social History   Social History  . Marital Status: Married    Spouse Name: N/A  . Number of Children: 2  . Years of Education: N/A   Occupational History  .     Social History Main Topics  . Smoking status: Never Smoker   . Smokeless tobacco: Never Used  . Alcohol Use: No     Comment: wine on occasion  . Drug Use: No  . Sexual Activity: Not on file   Other Topics Concern  . 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   2 sons    Family History  Problem Relation Age of Onset  . Alcohol abuse Mother   . Arthritis Mother   .  Lung cancer Mother   . Hypertension Mother   . Mental illness Mother   . Alcohol abuse Father   . Heart disease Father   . Hypertension Father   . Diabetes Father   . Colon cancer Father   . Early death Sister   . Arthritis Maternal Grandmother   . Hypertension Maternal Grandmother   . Breast cancer Maternal Grandmother   . Stroke Maternal Grandfather   . Hypertension Maternal Grandfather   . Heart disease Paternal Grandmother   . Hypertension Paternal Grandmother   . Colon cancer Paternal Grandfather   . Heart disease Paternal Grandfather   . Hypertension Paternal Grandfather   . Kidney disease Paternal Grandfather   . Diabetes Paternal Grandfather     BP 126/89 mmHg  Pulse 79  Ht 5\' 10"  (1.778 m)  Wt 210 lb (95.255 kg)  BMI 30.13 kg/m2   LMP 06/24/2015  Review of Systems: See HPI above.    Objective:  Physical Exam:  Gen: NAD  Left knee: Mild-mod effusion.  No other deformity. TTP medial joint line.  No other tenderness. FROM. Negative ant/post drawers. Negative valgus/varus testing. Negative lachmanns. Negative mcmurrays, apleys, patellar apprehension. NV intact distally.    Assessment & Plan:  1. Left knee pain - 2/2 mild DJD.  Discussed tylenol, aleve, glucosamine, capsaicin.  Knee aspirated and injected today.  Consider physical therapy as well.   Heat or ice.  F/u prn.  After informed written consent patient was lying supine on exam table.  Left knee was prepped with alcohol swab.  Utilizing superolateral approach, 3 mL of marcaine was used for local anesthesia.  Then using an 18g needle on 60cc syringe, 17mL mL of clear straw-colored fluid was aspirated from left knee.  Knee was then injected with 3:1 marcaine:depomedrol.  Patient tolerated procedure well without immediate complications

## 2015-08-08 NOTE — Assessment & Plan Note (Signed)
2/2 mild DJD.  Discussed tylenol, aleve, glucosamine, capsaicin.  Knee aspirated and injected today.  Consider physical therapy as well.   Heat or ice.  F/u prn.  After informed written consent patient was lying supine on exam table.  Left knee was prepped with alcohol swab.  Utilizing superolateral approach, 3 mL of marcaine was used for local anesthesia.  Then using an 18g needle on 60cc syringe, 17mL mL of clear straw-colored fluid was aspirated from left knee.  Knee was then injected with 3:1 marcaine:depomedrol.  Patient tolerated procedure well without immediate complications

## 2015-10-28 ENCOUNTER — Ambulatory Visit (INDEPENDENT_AMBULATORY_CARE_PROVIDER_SITE_OTHER): Payer: BC Managed Care – PPO | Admitting: Physician Assistant

## 2015-10-28 VITALS — BP 128/80 | HR 88 | Temp 98.0°F | Resp 16 | Ht 69.0 in | Wt 217.0 lb

## 2015-10-28 DIAGNOSIS — B9789 Other viral agents as the cause of diseases classified elsewhere: Principal | ICD-10-CM

## 2015-10-28 DIAGNOSIS — J069 Acute upper respiratory infection, unspecified: Secondary | ICD-10-CM

## 2015-10-28 MED ORDER — FLUTICASONE PROPIONATE 50 MCG/ACT NA SUSP: 2.0000 | Freq: Every day | NASAL | Status: DC

## 2015-10-28 MED ORDER — GUAIFENESIN ER 1200 MG PO TB12: 1.0000 | ORAL_TABLET | Freq: Two times a day (BID) | ORAL | Status: AC

## 2015-10-28 MED ORDER — HYDROCOD POLST-CPM POLST ER 10-8 MG/5ML PO SUER: 5.0000 mL | Freq: Two times a day (BID) | ORAL | Status: DC | PRN

## 2015-10-28 NOTE — Progress Notes (Signed)
Suzanne Singh  MRN: [id number] DOB: 20-Feb-1966  Subjective:  Pt presents to clinic with 2 day h/o cold symptoms.  Started with scratchy throat but now has sinus pressure with some dizziness.  She feels terrible but her ears are now really hurting and she is worried that she might have an infection.  Not sleeping well because of the congestion.  She is sneezing a lot but she does not typically have allergies.  [employer], broadcasting/film/videoteacher. Used cold preps.  Patient Active Problem List   Diagnosis Date Noted  . Left anterior shoulder pain 06/28/2015  . Skin infection 01/12/2015  . Breast pain 01/12/2015  . UTI (urinary tract infection) 11/16/2014  . Weight gain 11/16/2014  . Left medial knee pain 09/21/2014  . Thyromegaly 09/21/2014  . Obesity (BMI 30-39.9) 06/21/2014  . Edema 04/07/2014  . Maxillary sinusitis 11/07/2013  . Elevated BP 11/07/2013  . Insomnia 07/28/2013  . Epigastric pain 12/06/2012  . Hot flushes, perimenopausal 12/06/2012  . IBS (irritable bowel syndrome) 12/06/2012  . Fatigue 12/06/2012  . Family hx of colon cancer in grandparent 12/06/2012  . Screening for malignant neoplasm of the cervix 07/22/2012  . Routine general medical examination at a health care facility 07/22/2012  . Anxiety and depression 07/22/2012  . Eating disorder 07/22/2012  . GERD (gastroesophageal reflux disease) 07/22/2012  . Fibromyalgia 07/22/2012    No current outpatient prescriptions on file prior to visit.   No current facility-administered medications on file prior to visit.    Allergies  Allergen Reactions  . Ambien Cr [Zolpidem Tartrate Er]     Seizure   . Cymbalta [Duloxetine Hcl]     Seizure   . Celebrex [Celecoxib]     Hives     Review of Systems  Constitutional: Negative for fever and chills.  HENT: Positive for congestion, ear pain (R>L pain), hearing loss (muffled), postnasal drip, rhinorrhea (white color) and sore throat (improved - worse in the am improves as the day  goes on).   Respiratory: Positive for cough.   Gastrointestinal: Positive for nausea. Negative for vomiting, abdominal pain and diarrhea.  Musculoskeletal: Negative for myalgias.  Neurological: Positive for headaches.  Psychiatric/Behavioral: Positive for sleep disturbance (2nd to symptoms of cold).   Objective:  BP 128/80 mmHg  Pulse 88  Temp(Src) 98 F (36.7 C) (Oral)  Resp 16  Ht 5\' 9"  (1.753 m)  Wt 217 lb (98.431 kg)  BMI 32.03 kg/m2  SpO2 97%  LMP 10/07/2015 (Approximate)  Physical Exam  Constitutional: She is oriented to person, place, and time and well-developed, well-nourished, and in no distress.  HENT:  Head: Normocephalic and atraumatic.  Right Ear: Hearing, external ear and ear canal normal. A middle ear effusion (serous fluid) is present.  Left Ear: Hearing, external ear and ear canal normal. A middle ear effusion (serous fluid) is present.  Nose: Mucosal edema (pale) present.  Mouth/Throat: Uvula is midline, oropharynx is clear and moist and mucous membranes are normal.  Eyes: Conjunctivae are normal.  Neck: Normal range of motion.  Cardiovascular: Normal rate, regular rhythm and normal heart sounds.   No murmur heard. Pulmonary/Chest: Effort normal and breath sounds normal.  Neurological: She is alert and oriented to person, place, and time. Gait normal.  Skin: Skin is warm and dry.  Psychiatric: Mood, memory, affect and judgment normal.  Vitals reviewed.   Assessment and Plan :  Viral URI with cough - Plan: chlorpheniramine-HYDROcodone (TUSSIONEX PENNKINETIC ER) 10-8 MG/5ML SUER, Guaifenesin (MUCINEX MAXIMUM STRENGTH) 1200 MG  TB12, fluticasone (FLONASE) 50 MCG/ACT nasal spray   Symptomatic care d/w pt.  Rest.  Benny Lennert PA-C  [facility] 10/28/2015 9:15 AM

## 2015-10-28 NOTE — Patient Instructions (Signed)
Please push fluids.  Tylenol and Motrin for fever and body aches.    

## 2015-11-05 ENCOUNTER — Encounter: Payer: BC Managed Care – PPO | Admitting: Family Medicine

## 2015-11-07 ENCOUNTER — Encounter: Payer: BC Managed Care – PPO | Admitting: Family Medicine

## 2015-11-27 ENCOUNTER — Telehealth: Payer: Self-pay | Admitting: General Practice

## 2015-11-27 DIAGNOSIS — E010 Iodine-deficiency related diffuse (endemic) goiter: Secondary | ICD-10-CM

## 2015-11-27 NOTE — Telephone Encounter (Signed)
-----   Message from Sheliah HatchKatherine E Tabori, MD sent at 11/26/2015  8:00 AM EST ----- Please see if pt plans to schedule her US  ----- Message -----    From: SYSTEM    Sent: 11/25/2015  12:04 AM      To: Sheliah HatchKatherine E Tabori, MD

## 2015-11-27 NOTE — Telephone Encounter (Signed)
Called pt and LMOVM to inquire if she is going to have US for thyromegaly completed (ordered for [facility]). Message routed to Arman BogusKim Payne as this pt has an appt with Dr. Laury AxonLowne on 12/06/15.

## 2015-11-29 NOTE — Telephone Encounter (Signed)
Patient is calling and requesting to schedule US, Imaging will need new order, please advise

## 2015-11-29 NOTE — Addendum Note (Signed)
Addended by: Arnette NorrisPAYNE, Rangel Echeverri P on: 11/29/2015 10:12 AM   Modules accepted: Orders

## 2015-11-29 NOTE — Telephone Encounter (Signed)
The order is in.       KP 

## 2015-12-05 ENCOUNTER — Telehealth: Payer: Self-pay

## 2015-12-05 NOTE — Telephone Encounter (Signed)
Pre Visit call completed. 

## 2015-12-06 ENCOUNTER — Ambulatory Visit (HOSPITAL_BASED_OUTPATIENT_CLINIC_OR_DEPARTMENT_OTHER)
Admission: RE | Admit: 2015-12-06 | Discharge: 2015-12-06 | Disposition: A | Discharge status: Home / Self Care | Payer: BC Managed Care – PPO | Source: Ambulatory Visit | Attending: Family Medicine | Admitting: Family Medicine

## 2015-12-06 ENCOUNTER — Encounter: Payer: Self-pay | Admitting: Family Medicine

## 2015-12-06 ENCOUNTER — Other Ambulatory Visit: Payer: Self-pay | Admitting: Family Medicine

## 2015-12-06 ENCOUNTER — Ambulatory Visit (INDEPENDENT_AMBULATORY_CARE_PROVIDER_SITE_OTHER): Payer: BC Managed Care – PPO | Admitting: Family Medicine

## 2015-12-06 VITALS — BP 118/76 | HR 76 | Temp 97.7°F | Ht 69.0 in | Wt 214.0 lb

## 2015-12-06 DIAGNOSIS — E010 Iodine-deficiency related diffuse (endemic) goiter: Secondary | ICD-10-CM

## 2015-12-06 DIAGNOSIS — M169 Osteoarthritis of hip, unspecified: Secondary | ICD-10-CM | POA: Diagnosis not present

## 2015-12-06 DIAGNOSIS — M62838 Other muscle spasm: Secondary | ICD-10-CM | POA: Diagnosis not present

## 2015-12-06 DIAGNOSIS — E042 Nontoxic multinodular goiter: Secondary | ICD-10-CM | POA: Insufficient documentation

## 2015-12-06 DIAGNOSIS — Z0000 Encounter for general adult medical examination without abnormal findings: Secondary | ICD-10-CM

## 2015-12-06 DIAGNOSIS — E049 Nontoxic goiter, unspecified: Secondary | ICD-10-CM | POA: Diagnosis present

## 2015-12-06 DIAGNOSIS — N852 Hypertrophy of uterus: Secondary | ICD-10-CM | POA: Diagnosis not present

## 2015-12-06 DIAGNOSIS — Z23 Encounter for immunization: Secondary | ICD-10-CM | POA: Diagnosis not present

## 2015-12-06 LAB — LIPID PANEL
CHOL/HDL RATIO: 3
Cholesterol: 166 mg/dL (ref 0–200)
HDL: 52.3 mg/dL (ref >39.00)
LDL CALC: 101 mg/dL — AB (ref 0–99)
NonHDL: 113.95
Triglycerides: 66 mg/dL (ref 0.0–149.0)
VLDL: 13.2 mg/dL (ref 0.0–40.0)

## 2015-12-06 LAB — CBC WITH DIFFERENTIAL/PLATELET
BASOS ABS: 0 10*3/uL (ref 0.0–0.1)
Basophils Relative: 0.5 % (ref 0.0–3.0)
EOS ABS: 0.1 10*3/uL (ref 0.0–0.7)
Eosinophils Relative: 1.6 % (ref 0.0–5.0)
HCT: 38.1 % (ref 36.0–46.0)
Hemoglobin: 12.3 g/dL (ref 12.0–15.0)
LYMPHS PCT: 39.8 % (ref 12.0–46.0)
Lymphs Abs: 1.7 10*3/uL (ref 0.7–4.0)
MCHC: 32.2 g/dL (ref 30.0–36.0)
MCV: 90.3 fl (ref 78.0–100.0)
Monocytes Absolute: 0.4 10*3/uL (ref 0.1–1.0)
Monocytes Relative: 9.6 % (ref 3.0–12.0)
NEUTROS PCT: 48.5 % (ref 43.0–77.0)
Neutro Abs: 2.1 10*3/uL (ref 1.4–7.7)
PLATELETS: 329 10*3/uL (ref 150.0–400.0)
RBC: 4.21 Mil/uL (ref 3.87–5.11)
RDW: 13.6 % (ref 11.5–15.5)
WBC: 4.4 10*3/uL (ref 4.0–10.5)

## 2015-12-06 LAB — COMPREHENSIVE METABOLIC PANEL
ALBUMIN: 4 g/dL (ref 3.5–5.2)
ALT: 9 U/L (ref 0–35)
AST: 17 U/L (ref 0–37)
Alkaline Phosphatase: 85 U/L (ref 39–117)
BUN: 15 mg/dL (ref 6–23)
CHLORIDE: 104 meq/L (ref 96–112)
CO2: 29 meq/L (ref 19–32)
CREATININE: 0.75 mg/dL (ref 0.40–1.20)
Calcium: 9.2 mg/dL (ref 8.4–10.5)
GFR: 106.1 mL/min (ref >60.00)
Glucose, Bld: 87 mg/dL (ref 70–99)
Potassium: 3.9 mEq/L (ref 3.5–5.1)
SODIUM: 140 meq/L (ref 135–145)
Total Bilirubin: 0.3 mg/dL (ref 0.2–1.2)
Total Protein: 7.4 g/dL (ref 6.0–8.3)

## 2015-12-06 LAB — VITAMIN D 25 HYDROXY (VIT D DEFICIENCY, FRACTURES): VITD: 34.35 ng/mL (ref 30.00–100.00)

## 2015-12-06 LAB — SEDIMENTATION RATE: SED RATE: 23 mm/h — AB (ref 0–22)

## 2015-12-06 LAB — RHEUMATOID FACTOR

## 2015-12-06 LAB — TSH: TSH: 1.23 u[IU]/mL (ref 0.35–4.50)

## 2015-12-06 MED ORDER — CYCLOBENZAPRINE HCL 10 MG PO TABS: 10.0000 mg | ORAL_TABLET | Freq: Three times a day (TID) | ORAL | Status: DC | PRN

## 2015-12-06 MED ORDER — MELOXICAM 15 MG PO TABS: ORAL_TABLET | ORAL | Status: DC

## 2015-12-06 NOTE — Progress Notes (Signed)
Subjective:     Suzanne Singh is a 49 y.o. female and is here for a comprehensive physical exam. The patient reports problems - arthritis. Pt was told by gyn her uterus was big and she has an appointment at 230 to have US done.   Social History   Social History  . Marital Status: Married    Spouse Name: N/A  . Number of Children: 2  . Years of Education: N/A   Occupational History  .     Social History Main Topics  . Smoking status: Never Smoker   . Smokeless tobacco: Never Used  . Alcohol Use: No     Comment: wine on occasion  . Drug Use: No  . Sexual Activity: Not on file   Other Topics Concern  . 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   2 sons   Health Maintenance  Topic Date Due  . HIV Screening  01/03/1983  . INFLUENZA VACCINE  07/08/2016  . MAMMOGRAM  07/25/2016  . TETANUS/TDAP  12/08/2016  . PAP SMEAR  07/10/2018    The following portions of the patient's history were reviewed and updated as appropriate:  She  has a past medical history of Eating disorder; Arthritis; Depression; Migraine; Seizures ([place]); GERD (gastroesophageal reflux disease); Fibromyalgia; IBS (irritable bowel syndrome); and Arrhythmia. She  does not have any pertinent problems on file. She  has no past surgical history on file. Her family history includes Alcohol abuse in her father and mother; Arthritis in her maternal grandmother and mother; Breast cancer in her maternal grandmother; Colon cancer in her father and paternal grandfather; Diabetes in her father and paternal grandfather; Early death in her sister; Heart disease in her father, paternal grandfather, and paternal grandmother; Hypertension in her father, maternal grandfather, maternal grandmother, mother, paternal grandfather, and paternal grandmother; Kidney disease in her paternal grandfather; Lung cancer in  her mother; Mental illness in her mother; Stroke in her maternal grandfather. She  reports that she has never smoked. She has never used smokeless tobacco. She reports that she does not drink alcohol or use illicit drugs. She has a current medication list which includes the following prescription(s): cyclobenzaprine, fluticasone, and meloxicam. Current Outpatient Prescriptions on File Prior to Visit  Medication Sig Dispense Refill  . fluticasone (FLONASE) 50 MCG/ACT nasal spray Place 2 sprays into both nostrils daily. 16 g 6   No current facility-administered medications on file prior to visit.   She is allergic to ambien cr; cymbalta; celebrex; hydrocodone; and latex..  Review of Systems Review of Systems  Constitutional: Negative for activity change, appetite change and fatigue.  HENT: Negative for hearing loss, congestion, tinnitus and ear discharge.  dentist [id number]: Negative for visual disturbance (see optho q1y -- vision corrected to 20/20 with glasses).  Respiratory: Negative for cough, chest tightness and shortness of breath.   Cardiovascular: Negative for chest pain, palpitations and leg swelling.  Gastrointestinal: Negative for abdominal pain, diarrhea, constipation and abdominal distention.  Genitourinary: Negative for urgency, frequency, decreased urine volume and difficulty urinating.  Musculoskeletal: Negative for back pain, +arthralgias Skin: Negative for color change, pallor and rash.  Neurological: Negative for dizziness, light-headedness, numbness and headaches.  Hematological: Negative for adenopathy. Does not bruise/bleed easily.  Psychiatric/Behavioral: Negative for suicidal ideas, confusion, sleep disturbance, self-injury, dysphoric mood, decreased concentration and agitation.       Objective:  BP 118/76 mmHg  Pulse 76  Temp(Src) 97.7 F (36.5 C) (Oral)  Ht '5\' 9"'  (1.753 m)  Wt 214 lb (97.07 kg)  BMI 31.59 kg/m2  SpO2 98%  LMP 12/03/2015 General  appearance: alert, cooperative, appears stated age and no distress Head: Normocephalic, without obvious abnormality, atraumatic Eyes: conjunctivae/corneas clear. PERRL, EOM's intact. Fundi benign. Ears: normal TM's and external ear canals both ears Nose: Nares normal. Septum midline. Mucosa normal. No drainage or sinus tenderness. Throat: lips, mucosa, and tongue normal; teeth and gums normal Neck: no adenopathy, no carotid bruit, no JVD, supple, symmetrical, trachea midline and thyroid not enlarged, symmetric, no tenderness/mass/nodules Back: symmetric, no curvature. ROM normal. No CVA tenderness. Lungs: clear to auscultation bilaterally Breasts: gyn Heart: regular rate and rhythm, S1, S2 normal, no murmur, click, rub or gallop Abdomen: soft, non-tender; bowel sounds normal; no masses,  no organomegaly Pelvic: deferred--gyn Extremities: extremities normal, atraumatic, no cyanosis or edema Pulses: 2+ and symmetric Skin: Skin color, texture, turgor normal. No rashes or lesions Lymph nodes: Cervical, supraclavicular, and axillary nodes normal. Neurologic: Alert and oriented X 3, normal strength and tone. Normal symmetric reflexes. Normal coordination and gait Psych- no depression, no anxiety      Assessment:    Healthy female exam.       Plan:    see avs Check labs See After Visit Summary for Counseling Recommendations   1. Need for influenza vaccination   - Flu Vaccine QUAD 36+ mos IM (Fluarix)  2. Need for immunization against influenza    3. Muscle spasm   - cyclobenzaprine (FLEXERIL) 10 MG tablet; Take 1 tablet (10 mg total) by mouth 3 (three) times daily as needed for muscle spasms.  Dispense: 30 tablet; Refill: 0  4. Osteoarthritis of hip, unspecified laterality, unspecified osteoarthritis type   - meloxicam (MOBIC) 15 MG tablet; 1/2 - 1 po qd prn  Dispense: 30 tablet; Refill: 5 - Rheumatoid factor - ANA - Sed Rate (ESR) - Vitamin D (25 hydroxy)  5.  Preventative health care   - Comp Met (CMET) - Lipid panel - POCT urinalysis dipstick - CBC with Differential/Platelet - TSH  6. Enlarged uterus Per gyn - US Pelvis Complete; Future - US Transvaginal Non-OB; Future

## 2015-12-06 NOTE — Progress Notes (Signed)
Pre visit review using our clinic review tool, if applicable. No additional management support is needed unless otherwise documented below in the visit note. 

## 2015-12-06 NOTE — Patient Instructions (Signed)
Preventive Care for Adults, Female A healthy lifestyle and preventive care can promote health and wellness. Preventive health guidelines for women include the following key practices.  A routine yearly physical is a good way to check with your health care provider about your health and preventive screening. It is a chance to share any concerns and updates on your health and to receive a thorough exam.  Visit your dentist for a routine exam and preventive care every 6 months. Brush your teeth twice a day and floss once a day. Good oral hygiene prevents tooth decay and gum disease.  The frequency of eye exams is based on your age, health, family medical history, use of contact lenses, and other factors. Follow your health care provider's recommendations for frequency of eye exams.  Eat a healthy diet. Foods like vegetables, fruits, whole grains, low-fat dairy products, and lean protein foods contain the nutrients you need without too many calories. Decrease your intake of foods high in solid fats, added sugars, and salt. Eat the right amount of calories for you.Get information about a proper diet from your health care provider, if necessary.  Regular physical exercise is one of the most important things you can do for your health. Most adults should get at least 150 minutes of moderate-intensity exercise (any activity that increases your heart rate and causes you to sweat) each week. In addition, most adults need muscle-strengthening exercises on 2 or more days a week.  Maintain a healthy weight. The body mass index (BMI) is a screening tool to identify possible weight problems. It provides an estimate of body fat based on height and weight. Your health care provider can find your BMI and can help you achieve or maintain a healthy weight.For adults 20 years and older:  A BMI below 18.5 is considered underweight.  A BMI of 18.5 to 24.9 is normal.  A BMI of 25 to 29.9 is considered overweight.  A  BMI of 30 and above is considered obese.  Maintain normal blood lipids and cholesterol levels by exercising and minimizing your intake of saturated fat. Eat a balanced diet with plenty of fruit and vegetables. Blood tests for lipids and cholesterol should begin at age 45 and be repeated every 5 years. If your lipid or cholesterol levels are high, you are over 50, or you are at high risk for heart disease, you may need your cholesterol levels checked more frequently.Ongoing high lipid and cholesterol levels should be treated with medicines if diet and exercise are not working.  If you smoke, find out from your health care provider how to quit. If you do not use tobacco, do not start.  Lung cancer screening is recommended for adults aged 45-80 years who are at high risk for developing lung cancer because of a history of smoking. A yearly low-dose CT scan of the lungs is recommended for people who have at least a 30-pack-year history of smoking and are a current smoker or have quit within the past 15 years. A pack year of smoking is smoking an average of 1 pack of cigarettes a day for 1 year (for example: 1 pack a day for 30 years or 2 packs a day for 15 years). Yearly screening should continue until the smoker has stopped smoking for at least 15 years. Yearly screening should be stopped for people who develop a health problem that would prevent them from having lung cancer treatment.  If you are pregnant, do not drink alcohol. If you are  breastfeeding, be very cautious about drinking alcohol. If you are not pregnant and choose to drink alcohol, do not have more than 1 drink per day. One drink is considered to be 12 ounces (355 mL) of beer, 5 ounces (148 mL) of wine, or 1.5 ounces (44 mL) of liquor.  Avoid use of street drugs. Do not share needles with anyone. Ask for help if you need support or instructions about stopping the use of drugs.  High blood pressure causes heart disease and increases the risk  of stroke. Your blood pressure should be checked at least every 1 to 2 years. Ongoing high blood pressure should be treated with medicines if weight loss and exercise do not work.  If you are 55-79 years old, ask your health care provider if you should take aspirin to prevent strokes.  Diabetes screening is done by taking a blood sample to check your blood glucose level after you have not eaten for a certain period of time (fasting). If you are not overweight and you do not have risk factors for diabetes, you should be screened once every 3 years starting at age 45. If you are overweight or obese and you are 40-70 years of age, you should be screened for diabetes every year as part of your cardiovascular risk assessment.  Breast cancer screening is essential preventive care for women. You should practice "breast self-awareness." This means understanding the normal appearance and feel of your breasts and may include breast self-examination. Any changes detected, no matter how small, should be reported to a health care provider. Women in their 20s and 30s should have a clinical breast exam (CBE) by a health care provider as part of a regular health exam every 1 to 3 years. After age 40, women should have a CBE every year. Starting at age 40, women should consider having a mammogram (breast X-ray test) every year. Women who have a family history of breast cancer should talk to their health care provider about genetic screening. Women at a high risk of breast cancer should talk to their health care providers about having an MRI and a mammogram every year.  Breast cancer gene (BRCA)-related cancer risk assessment is recommended for women who have family members with BRCA-related cancers. BRCA-related cancers include breast, ovarian, tubal, and peritoneal cancers. Having family members with these cancers may be associated with an increased risk for harmful changes (mutations) in the breast cancer genes BRCA1 and  BRCA2. Results of the assessment will determine the need for genetic counseling and BRCA1 and BRCA2 testing.  Your health care provider may recommend that you be screened regularly for cancer of the pelvic organs (ovaries, uterus, and vagina). This screening involves a pelvic examination, including checking for microscopic changes to the surface of your cervix (Pap test). You may be encouraged to have this screening done every 3 years, beginning at age 21.  For women ages 30-65, health care providers may recommend pelvic exams and Pap testing every 3 years, or they may recommend the Pap and pelvic exam, combined with testing for human papilloma virus (HPV), every 5 years. Some types of HPV increase your risk of cervical cancer. Testing for HPV may also be done on women of any age with unclear Pap test results.  Other health care providers may not recommend any screening for nonpregnant women who are considered low risk for pelvic cancer and who do not have symptoms. Ask your health care provider if a screening pelvic exam is right for   you.  If you have had past treatment for cervical cancer or a condition that could lead to cancer, you need Pap tests and screening for cancer for at least 20 years after your treatment. If Pap tests have been discontinued, your risk factors (such as having a new sexual partner) need to be reassessed to determine if screening should resume. Some women have medical problems that increase the chance of getting cervical cancer. In these cases, your health care provider may recommend more frequent screening and Pap tests.  Colorectal cancer can be detected and often prevented. Most routine colorectal cancer screening begins at the age of 50 years and continues through age 75 years. However, your health care provider may recommend screening at an earlier age if you have risk factors for colon cancer. On a yearly basis, your health care provider may provide home test kits to check  for hidden blood in the stool. Use of a small camera at the end of a tube, to directly examine the colon (sigmoidoscopy or colonoscopy), can detect the earliest forms of colorectal cancer. Talk to your health care provider about this at age 50, when routine screening begins. Direct exam of the colon should be repeated every 5-10 years through age 75 years, unless early forms of precancerous polyps or small growths are found.  People who are at an increased risk for hepatitis B should be screened for this virus. You are considered at high risk for hepatitis B if:  You were born in a country where hepatitis B occurs often. Talk with your health care provider about which countries are considered high risk.  Your parents were born in a high-risk country and you have not received a shot to protect against hepatitis B (hepatitis B vaccine).  You have HIV or AIDS.  You use needles to inject street drugs.  You live with, or have sex with, someone who has hepatitis B.  You get hemodialysis treatment.  You take certain medicines for conditions like cancer, organ transplantation, and autoimmune conditions.  Hepatitis C blood testing is recommended for all people born from 1945 through 1965 and any individual with known risks for hepatitis C.  Practice safe sex. Use condoms and avoid high-risk sexual practices to reduce the spread of sexually transmitted infections (STIs). STIs include gonorrhea, chlamydia, syphilis, trichomonas, herpes, HPV, and human immunodeficiency virus (HIV). Herpes, HIV, and HPV are viral illnesses that have no cure. They can result in disability, cancer, and death.  You should be screened for sexually transmitted illnesses (STIs) including gonorrhea and chlamydia if:  You are sexually active and are younger than 24 years.  You are older than 24 years and your health care provider tells you that you are at risk for this type of infection.  Your sexual activity has changed  since you were last screened and you are at an increased risk for chlamydia or gonorrhea. Ask your health care provider if you are at risk.  If you are at risk of being infected with HIV, it is recommended that you take a prescription medicine daily to prevent HIV infection. This is called preexposure prophylaxis (PrEP). You are considered at risk if:  You are sexually active and do not regularly use condoms or know the HIV status of your partner(s).  You take drugs by injection.  You are sexually active with a partner who has HIV.  Talk with your health care provider about whether you are at high risk of being infected with HIV. If   you choose to begin PrEP, you should first be tested for HIV. You should then be tested every 3 months for as long as you are taking PrEP.  Osteoporosis is a disease in which the bones lose minerals and strength with aging. This can result in serious bone fractures or breaks. The risk of osteoporosis can be identified using a bone density scan. Women ages 67 years and over and women at risk for fractures or osteoporosis should discuss screening with their health care providers. Ask your health care provider whether you should take a calcium supplement or vitamin D to reduce the rate of osteoporosis.  Menopause can be associated with physical symptoms and risks. Hormone replacement therapy is available to decrease symptoms and risks. You should talk to your health care provider about whether hormone replacement therapy is right for you.  Use sunscreen. Apply sunscreen liberally and repeatedly throughout the day. You should seek shade when your shadow is shorter than you. Protect yourself by wearing long sleeves, pants, a wide-brimmed hat, and sunglasses year round, whenever you are outdoors.  Once a month, do a whole body skin exam, using a mirror to look at the skin on your back. Tell your health care provider of new moles, moles that have irregular borders, moles that  are larger than a pencil eraser, or moles that have changed in shape or color.  Stay current with required vaccines (immunizations).  Influenza vaccine. All adults should be immunized every year.  Tetanus, diphtheria, and acellular pertussis (Td, Tdap) vaccine. Pregnant women should receive 1 dose of Tdap vaccine during each pregnancy. The dose should be obtained regardless of the length of time since the last dose. Immunization is preferred during the 27th-36th week of gestation. An adult who has not previously received Tdap or who does not know her vaccine status should receive 1 dose of Tdap. This initial dose should be followed by tetanus and diphtheria toxoids (Td) booster doses every 10 years. Adults with an unknown or incomplete history of completing a 3-dose immunization series with Td-containing vaccines should begin or complete a primary immunization series including a Tdap dose. Adults should receive a Td booster every 10 years.  Varicella vaccine. An adult without evidence of immunity to varicella should receive 2 doses or a second dose if she has previously received 1 dose. Pregnant females who do not have evidence of immunity should receive the first dose after pregnancy. This first dose should be obtained before leaving the health care facility. The second dose should be obtained 4-8 weeks after the first dose.  Human papillomavirus (HPV) vaccine. Females aged 13-26 years who have not received the vaccine previously should obtain the 3-dose series. The vaccine is not recommended for use in pregnant females. However, pregnancy testing is not needed before receiving a dose. If a female is found to be pregnant after receiving a dose, no treatment is needed. In that case, the remaining doses should be delayed until after the pregnancy. Immunization is recommended for any person with an immunocompromised condition through the age of 61 years if she did not get any or all doses earlier. During the  3-dose series, the second dose should be obtained 4-8 weeks after the first dose. The third dose should be obtained 24 weeks after the first dose and 16 weeks after the second dose.  Zoster vaccine. One dose is recommended for adults aged 30 years or older unless certain conditions are present.  Measles, mumps, and rubella (MMR) vaccine. Adults born  before 1957 generally are considered immune to measles and mumps. Adults born in 1957 or later should have 1 or more doses of MMR vaccine unless there is a contraindication to the vaccine or there is laboratory evidence of immunity to each of the three diseases. A routine second dose of MMR vaccine should be obtained at least 28 days after the first dose for students attending postsecondary schools, health care workers, or international travelers. People who received inactivated measles vaccine or an unknown type of measles vaccine during 1963-1967 should receive 2 doses of MMR vaccine. People who received inactivated mumps vaccine or an unknown type of mumps vaccine before 1979 and are at high risk for mumps infection should consider immunization with 2 doses of MMR vaccine. For females of childbearing age, rubella immunity should be determined. If there is no evidence of immunity, females who are not pregnant should be vaccinated. If there is no evidence of immunity, females who are pregnant should delay immunization until after pregnancy. Unvaccinated health care workers born before 1957 who lack laboratory evidence of measles, mumps, or rubella immunity or laboratory confirmation of disease should consider measles and mumps immunization with 2 doses of MMR vaccine or rubella immunization with 1 dose of MMR vaccine.  Pneumococcal 13-valent conjugate (PCV13) vaccine. When indicated, a person who is uncertain of his immunization history and has no record of immunization should receive the PCV13 vaccine. All adults 65 years of age and older should receive this  vaccine. An adult aged 19 years or older who has certain medical conditions and has not been previously immunized should receive 1 dose of PCV13 vaccine. This PCV13 should be followed with a dose of pneumococcal polysaccharide (PPSV23) vaccine. Adults who are at high risk for pneumococcal disease should obtain the PPSV23 vaccine at least 8 weeks after the dose of PCV13 vaccine. Adults older than 47 years of age who have normal immune system function should obtain the PPSV23 vaccine dose at least 1 year after the dose of PCV13 vaccine.  Pneumococcal polysaccharide (PPSV23) vaccine. When PCV13 is also indicated, PCV13 should be obtained first. All adults aged 65 years and older should be immunized. An adult younger than age 65 years who has certain medical conditions should be immunized. Any person who resides in a nursing home or long-term care facility should be immunized. An adult smoker should be immunized. People with an immunocompromised condition and certain other conditions should receive both PCV13 and PPSV23 vaccines. People with human immunodeficiency virus (HIV) infection should be immunized as soon as possible after diagnosis. Immunization during chemotherapy or radiation therapy should be avoided. Routine use of PPSV23 vaccine is not recommended for American Indians, [place] Natives, or people younger than 65 years unless there are medical conditions that require PPSV23 vaccine. When indicated, people who have unknown immunization and have no record of immunization should receive PPSV23 vaccine. One-time revaccination 5 years after the first dose of PPSV23 is recommended for people aged 19-64 years who have chronic kidney failure, nephrotic syndrome, asplenia, or immunocompromised conditions. People who received 1-2 doses of PPSV23 before age 65 years should receive another dose of PPSV23 vaccine at age 65 years or later if at least 5 years have passed since the previous dose. Doses of PPSV23 are not  needed for people immunized with PPSV23 at or after age 65 years.  Meningococcal vaccine. Adults with asplenia or persistent complement component deficiencies should receive 2 doses of quadrivalent meningococcal conjugate (MenACWY-D) vaccine. The doses should be obtained   at least 2 months apart. Microbiologists working with certain meningococcal bacteria, [place] recruits, people at risk during an outbreak, and people who travel to or live in countries with a high rate of meningitis should be immunized. A first-year college student up through age 34 years who is living in a residence hall should receive a dose if she did not receive a dose on or after her 16th birthday. Adults who have certain high-risk conditions should receive one or more doses of vaccine.  Hepatitis A vaccine. Adults who wish to be protected from this disease, have certain high-risk conditions, work with hepatitis A-infected animals, work in hepatitis A research labs, or travel to or work in countries with a high rate of hepatitis A should be immunized. Adults who were previously unvaccinated and who anticipate close contact with an international adoptee during the first 60 days after arrival in the [place] from a country with a high rate of hepatitis A should be immunized.  Hepatitis B vaccine. Adults who wish to be protected from this disease, have certain high-risk conditions, may be exposed to blood or other infectious body fluids, are household contacts or sex partners of hepatitis B positive people, are clients or workers in certain care facilities, or travel to or work in countries with a high rate of hepatitis B should be immunized.  Haemophilus influenzae type b (Hib) vaccine. A previously unvaccinated person with asplenia or sickle cell disease or having a scheduled splenectomy should receive 1 dose of Hib vaccine. Regardless of previous immunization, a recipient of a hematopoietic stem cell transplant should receive a  3-dose series 6-12 months after her successful transplant. Hib vaccine is not recommended for adults with HIV infection. Preventive Services / Frequency Ages 35 to 4 years  Blood pressure check.** / Every 3-5 years.  Lipid and cholesterol check.** / Every 5 years beginning at age 60.  Clinical breast exam.** / Every 3 years for women in their 71s and 10s.  BRCA-related cancer risk assessment.** / For women who have family members with a BRCA-related cancer (breast, ovarian, tubal, or peritoneal cancers).  Pap test.** / Every 2 years from ages 76 through 26. Every 3 years starting at age 61 through age 76 or 93 with a history of 3 consecutive normal Pap tests.  HPV screening.** / Every 3 years from ages 37 through ages 60 to 51 with a history of 3 consecutive normal Pap tests.  Hepatitis C blood test.** / For any individual with known risks for hepatitis C.  Skin self-exam. / Monthly.  Influenza vaccine. / Every year.  Tetanus, diphtheria, and acellular pertussis (Tdap, Td) vaccine.** / Consult your health care provider. Pregnant women should receive 1 dose of Tdap vaccine during each pregnancy. 1 dose of Td every 10 years.  Varicella vaccine.** / Consult your health care provider. Pregnant females who do not have evidence of immunity should receive the first dose after pregnancy.  HPV vaccine. / 3 doses over 6 months, if 93 and younger. The vaccine is not recommended for use in pregnant females. However, pregnancy testing is not needed before receiving a dose.  Measles, mumps, rubella (MMR) vaccine.** / You need at least 1 dose of MMR if you were born in 1957 or later. You may also need a 2nd dose. For females of childbearing age, rubella immunity should be determined. If there is no evidence of immunity, females who are not pregnant should be vaccinated. If there is no evidence of immunity, females who are  pregnant should delay immunization until after pregnancy.  Pneumococcal  13-valent conjugate (PCV13) vaccine.** / Consult your health care provider.  Pneumococcal polysaccharide (PPSV23) vaccine.** / 1 to 2 doses if you smoke cigarettes or if you have certain conditions.  Meningococcal vaccine.** / 1 dose if you are age 68 to 8 years and a Market researcher living in a residence hall, or have one of several medical conditions, you need to get vaccinated against meningococcal disease. You may also need additional booster doses.  Hepatitis A vaccine.** / Consult your health care provider.  Hepatitis B vaccine.** / Consult your health care provider.  Haemophilus influenzae type b (Hib) vaccine.** / Consult your health care provider. Ages 7 to 53 years  Blood pressure check.** / Every year.  Lipid and cholesterol check.** / Every 5 years beginning at age 25 years.  Lung cancer screening. / Every year if you are aged 11-80 years and have a 30-pack-year history of smoking and currently smoke or have quit within the past 15 years. Yearly screening is stopped once you have quit smoking for at least 15 years or develop a health problem that would prevent you from having lung cancer treatment.  Clinical breast exam.** / Every year after age 48 years.  BRCA-related cancer risk assessment.** / For women who have family members with a BRCA-related cancer (breast, ovarian, tubal, or peritoneal cancers).  Mammogram.** / Every year beginning at age 41 years and continuing for as long as you are in good health. Consult with your health care provider.  Pap test.** / Every 3 years starting at age 65 years through age 37 or 70 years with a history of 3 consecutive normal Pap tests.  HPV screening.** / Every 3 years from ages 72 years through ages 60 to 40 years with a history of 3 consecutive normal Pap tests.  Fecal occult blood test (FOBT) of stool. / Every year beginning at age 21 years and continuing until age 5 years. You may not need to do this test if you get  a colonoscopy every 10 years.  Flexible sigmoidoscopy or colonoscopy.** / Every 5 years for a flexible sigmoidoscopy or every 10 years for a colonoscopy beginning at age 35 years and continuing until age 48 years.  Hepatitis C blood test.** / For all people born from [birth year] through 1965 and any individual with known risks for hepatitis C.  Skin self-exam. / Monthly.  Influenza vaccine. / Every year.  Tetanus, diphtheria, and acellular pertussis (Tdap/Td) vaccine.** / Consult your health care provider. Pregnant women should receive 1 dose of Tdap vaccine during each pregnancy. 1 dose of Td every 10 years.  Varicella vaccine.** / Consult your health care provider. Pregnant females who do not have evidence of immunity should receive the first dose after pregnancy.  Zoster vaccine.** / 1 dose for adults aged 30 years or older.  Measles, mumps, rubella (MMR) vaccine.** / You need at least 1 dose of MMR if you were born in 1957 or later. You may also need a second dose. For females of childbearing age, rubella immunity should be determined. If there is no evidence of immunity, females who are not pregnant should be vaccinated. If there is no evidence of immunity, females who are pregnant should delay immunization until after pregnancy.  Pneumococcal 13-valent conjugate (PCV13) vaccine.** / Consult your health care provider.  Pneumococcal polysaccharide (PPSV23) vaccine.** / 1 to 2 doses if you smoke cigarettes or if you have certain conditions.  Meningococcal vaccine.** /  Consult your health care provider.  Hepatitis A vaccine.** / Consult your health care provider.  Hepatitis B vaccine.** / Consult your health care provider.  Haemophilus influenzae type b (Hib) vaccine.** / Consult your health care provider. Ages 64 years and over  Blood pressure check.** / Every year.  Lipid and cholesterol check.** / Every 5 years beginning at age 23 years.  Lung cancer screening. / Every year if you  are aged 16-80 years and have a 30-pack-year history of smoking and currently smoke or have quit within the past 15 years. Yearly screening is stopped once you have quit smoking for at least 15 years or develop a health problem that would prevent you from having lung cancer treatment.  Clinical breast exam.** / Every year after age 74 years.  BRCA-related cancer risk assessment.** / For women who have family members with a BRCA-related cancer (breast, ovarian, tubal, or peritoneal cancers).  Mammogram.** / Every year beginning at age 44 years and continuing for as long as you are in good health. Consult with your health care provider.  Pap test.** / Every 3 years starting at age 58 years through age 22 or 39 years with 3 consecutive normal Pap tests. Testing can be stopped between 65 and 70 years with 3 consecutive normal Pap tests and no abnormal Pap or HPV tests in the past 10 years.  HPV screening.** / Every 3 years from ages 64 years through ages 70 or 61 years with a history of 3 consecutive normal Pap tests. Testing can be stopped between 65 and 70 years with 3 consecutive normal Pap tests and no abnormal Pap or HPV tests in the past 10 years.  Fecal occult blood test (FOBT) of stool. / Every year beginning at age 40 years and continuing until age 27 years. You may not need to do this test if you get a colonoscopy every 10 years.  Flexible sigmoidoscopy or colonoscopy.** / Every 5 years for a flexible sigmoidoscopy or every 10 years for a colonoscopy beginning at age 7 years and continuing until age 32 years.  Hepatitis C blood test.** / For all people born from [birth year] through 1965 and any individual with known risks for hepatitis C.  Osteoporosis screening.** / A one-time screening for women ages 30 years and over and women at risk for fractures or osteoporosis.  Skin self-exam. / Monthly.  Influenza vaccine. / Every year.  Tetanus, diphtheria, and acellular pertussis (Tdap/Td)  vaccine.** / 1 dose of Td every 10 years.  Varicella vaccine.** / Consult your health care provider.  Zoster vaccine.** / 1 dose for adults aged 35 years or older.  Pneumococcal 13-valent conjugate (PCV13) vaccine.** / Consult your health care provider.  Pneumococcal polysaccharide (PPSV23) vaccine.** / 1 dose for all adults aged 46 years and older.  Meningococcal vaccine.** / Consult your health care provider.  Hepatitis A vaccine.** / Consult your health care provider.  Hepatitis B vaccine.** / Consult your health care provider.  Haemophilus influenzae type b (Hib) vaccine.** / Consult your health care provider. ** Family history and personal history of risk and conditions may change your health care provider's recommendations.   This information is not intended to replace advice given to you by your health care provider. Make sure you discuss any questions you have with your health care provider.   Document Released: 01/20/2002 Document Revised: 12/15/2014 Document Reviewed: 04/21/2011 Elsevier Interactive Patient Education [employer].

## 2015-12-07 ENCOUNTER — Telehealth: Payer: Self-pay | Admitting: Family Medicine

## 2015-12-07 ENCOUNTER — Ambulatory Visit (HOSPITAL_BASED_OUTPATIENT_CLINIC_OR_DEPARTMENT_OTHER)
Admission: RE | Admit: 2015-12-07 | Discharge: 2015-12-07 | Disposition: A | Discharge status: Home / Self Care | Payer: BC Managed Care – PPO | Source: Ambulatory Visit | Attending: Family Medicine | Admitting: Family Medicine

## 2015-12-07 DIAGNOSIS — N852 Hypertrophy of uterus: Secondary | ICD-10-CM | POA: Insufficient documentation

## 2015-12-07 DIAGNOSIS — N858 Other specified noninflammatory disorders of uterus: Secondary | ICD-10-CM | POA: Diagnosis not present

## 2015-12-07 DIAGNOSIS — R140 Abdominal distension (gaseous): Secondary | ICD-10-CM | POA: Diagnosis not present

## 2015-12-07 DIAGNOSIS — E049 Nontoxic goiter, unspecified: Secondary | ICD-10-CM

## 2015-12-07 LAB — ANA: ANA: NEGATIVE

## 2015-12-07 NOTE — Telephone Encounter (Signed)
Notes Recorded by Lelon PerlaYvonne R Lowne, DO on 12/06/2015 at 8:41 PM Large goiter-- refer to ENT for further evaluation and biopsy

## 2015-12-07 NOTE — Telephone Encounter (Signed)
Patient has been made aware and verbalized understanding. The ref has been placed.     KP

## 2015-12-07 NOTE — Telephone Encounter (Signed)
Caller name: Bronson IngYvette   Relationship to patient: Self   Can be reached: [phone]  Reason for call: Pt called in to get her ultrasound results.

## 2015-12-08 ENCOUNTER — Other Ambulatory Visit: Payer: Self-pay | Admitting: Medical

## 2015-12-11 ENCOUNTER — Telehealth: Payer: Self-pay | Admitting: Family Medicine

## 2015-12-11 NOTE — Telephone Encounter (Signed)
Spoke with patient and she see's Dr. Arelia SneddonMccomb, she will follow up with him. Results have been faxed.      KP

## 2015-12-11 NOTE — Telephone Encounter (Signed)
Relation to ZO:XWRUpt:self Call back number:[phone]   Reason for call:  Patient viewed my chart results regarding ultrasound and would like to discuss the next step.

## 2015-12-18 ENCOUNTER — Other Ambulatory Visit: Payer: Self-pay | Admitting: Family Medicine

## 2015-12-18 MED ORDER — CLONAZEPAM 0.5 MG PO TABS: 0.5000 mg | ORAL_TABLET | Freq: Two times a day (BID) | ORAL | Status: DC | PRN

## 2015-12-18 NOTE — Telephone Encounter (Signed)
Called pt and she is requesting refill on clonazepam 0.5mg . States that she does not take it all the time but it should not have been taken off of her medication list.  Last filled 05/28/2015 #30, 0 RF.  Last UDS: None  Please advise.

## 2015-12-18 NOTE — Telephone Encounter (Signed)
Rx printed and sent to provider for signature. Called pt and made her aware.

## 2015-12-18 NOTE — Telephone Encounter (Signed)
Ok to give klonopin 0.5 mg 1 po bid prn #60

## 2015-12-18 NOTE — Telephone Encounter (Signed)
Caller name: Self  Can be reached: [phone]  Pharmacy:  [facility] [id number] - [place], [place] - [address] AT [facility] [address] [phone] (Phone) [phone] (Fax)        Reason for call: Request refill on a medication that Dr. Beverely Lowabori had written for her but it has been taken off of her medication list. Request call back to discuss restarting

## 2015-12-28 ENCOUNTER — Other Ambulatory Visit: Payer: Self-pay | Admitting: Family Medicine

## 2015-12-28 DIAGNOSIS — E041 Nontoxic single thyroid nodule: Secondary | ICD-10-CM

## 2016-02-09 ENCOUNTER — Other Ambulatory Visit: Payer: Self-pay | Admitting: Family Medicine

## 2016-02-10 ENCOUNTER — Other Ambulatory Visit: Payer: Self-pay | Admitting: Family Medicine

## 2016-02-11 NOTE — Telephone Encounter (Signed)
Last seen 12/06/15 and filled 06/28/15 #30 Last filled by Dr.Tabori  Please advise     KP

## 2016-02-12 ENCOUNTER — Ambulatory Visit
Admission: RE | Admit: 2016-02-12 | Discharge: 2016-02-12 | Disposition: A | Discharge status: Home / Self Care | Payer: BC Managed Care – PPO | Source: Ambulatory Visit | Attending: Family Medicine | Admitting: Family Medicine

## 2016-02-12 ENCOUNTER — Other Ambulatory Visit (HOSPITAL_COMMUNITY)
Admission: RE | Admit: 2016-02-12 | Discharge: 2016-02-12 | Disposition: A | Discharge status: Home / Self Care | Payer: BC Managed Care – PPO | Source: Ambulatory Visit | Attending: Physician Assistant | Admitting: Physician Assistant

## 2016-02-12 DIAGNOSIS — E041 Nontoxic single thyroid nodule: Secondary | ICD-10-CM | POA: Insufficient documentation

## 2016-02-12 NOTE — Procedures (Signed)
Using direct ultrasound guidance, 4 passes were made using needles into the nodule within the right lobe of the thyroid.   Ultrasound was used to confirm needle placements on all occasions.   Specimens were sent to Pathology for analysis.   [facility] Center[facility]END[facility] Ellon Marasco PA-C

## 2016-02-20 ENCOUNTER — Ambulatory Visit (INDEPENDENT_AMBULATORY_CARE_PROVIDER_SITE_OTHER): Payer: BC Managed Care – PPO | Admitting: Medical

## 2016-02-20 ENCOUNTER — Encounter: Payer: Self-pay | Admitting: Medical

## 2016-02-20 ENCOUNTER — Ambulatory Visit: Payer: BC Managed Care – PPO | Admitting: Medical

## 2016-02-20 VITALS — BP 120/80 | HR 83 | Temp 98.7°F | Ht 69.0 in | Wt 217.6 lb

## 2016-02-20 DIAGNOSIS — R0981 Nasal congestion: Secondary | ICD-10-CM

## 2016-02-20 DIAGNOSIS — R05 Cough: Secondary | ICD-10-CM | POA: Diagnosis not present

## 2016-02-20 DIAGNOSIS — J209 Acute bronchitis, unspecified: Secondary | ICD-10-CM

## 2016-02-20 DIAGNOSIS — R059 Cough, unspecified: Secondary | ICD-10-CM

## 2016-02-20 DIAGNOSIS — J0100 Acute maxillary sinusitis, unspecified: Secondary | ICD-10-CM | POA: Diagnosis not present

## 2016-02-20 LAB — POCT RAPID INFLUENZA A&B: Influenza A+B Virus Ag-Direct(Rapid): NEGATIVE

## 2016-02-20 MED ORDER — GUAIFENESIN-CODEINE 100-10 MG/5ML PO SOLN: ORAL | Status: DC

## 2016-02-20 MED ORDER — ALBUTEROL SULFATE HFA 108 (90 BASE) MCG/ACT IN AERS: 2.0000 | INHALATION_SPRAY | Freq: Four times a day (QID) | RESPIRATORY_TRACT | Status: DC | PRN

## 2016-02-20 MED ORDER — AMOXICILLIN-POT CLAVULANATE 875-125 MG PO TABS: 1.0000 | ORAL_TABLET | Freq: Two times a day (BID) | ORAL | Status: DC

## 2016-02-20 NOTE — Progress Notes (Signed)
Pre visit review using our clinic review tool, if applicable. No additional management support is needed unless otherwise documented below in the visit note. 

## 2016-02-20 NOTE — Progress Notes (Signed)
Subjective:    Patient ID: Suzanne Singh, female    DOB: 03-Aug-1966, 50 y.o.   MRN: [id number]  HPI  Pt in state sick for past week. Started with runny nose nasal congestion and sneezing. Gradually has gotten worse. Now has sinus pressure and chest congestion. Some mucous when she blows nose and productive.  Pt has no wheezing but mentioned that feels like can't take full breath and then will cough.  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  Pt has hx of allergies.    Review of Systems  Constitutional: Positive for fatigue. Negative for chills and diaphoresis.  HENT: Positive for congestion and sinus pressure. Negative for sneezing.   Respiratory: Positive for cough. Negative for shortness of breath and wheezing.   Cardiovascular: Negative for chest pain and palpitations.  Musculoskeletal: Negative for myalgias.  Neurological: Negative for dizziness, weakness and headaches.  Hematological: Negative for adenopathy. Does not bruise/bleed easily.    Past Medical History  Diagnosis Date  . Eating disorder   . Arthritis   . Depression   . Migraine   . Seizures (HCC)   . GERD (gastroesophageal reflux disease)   . Fibromyalgia   . IBS (irritable bowel syndrome)   . Arrhythmia     Social History   Social History  . Marital Status: Married    Spouse Name: N/A  . Number of Children: 2  . Years of Education: N/A   Occupational History  .     Social History Main Topics  . Smoking status: Never Smoker   . Smokeless tobacco: Never Used  . Alcohol Use: No     Comment: wine on occasion  . Drug Use: No  . Sexual Activity: Not on file   Other Topics Concern  . 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   2 sons    No past surgical history on file.  Family History  Problem Relation Age of Onset  . Alcohol abuse Mother   . Arthritis Mother   . Lung cancer  Mother   . Hypertension Mother   . Mental illness Mother   . Alcohol abuse Father   . Heart disease Father   . Hypertension Father   . Diabetes Father   . Colon cancer Father   . Early death Sister   . Arthritis Maternal Grandmother   . Hypertension Maternal Grandmother   . Breast cancer Maternal Grandmother   . Stroke Maternal Grandfather   . Hypertension Maternal Grandfather   . Heart disease Paternal Grandmother   . Hypertension Paternal Grandmother   . Colon cancer Paternal Grandfather   . Heart disease Paternal Grandfather   . Hypertension Paternal Grandfather   . Kidney disease Paternal Grandfather   . Diabetes Paternal Grandfather     Allergies  Allergen Reactions  . Ambien Cr [Zolpidem Tartrate Er]     Seizure   . Cymbalta [Duloxetine Hcl]     Seizure   . Celebrex [Celecoxib]     Hives   . Hydrocodone Itching  . Latex Rash    Adhesive    Current Outpatient Prescriptions on File Prior to Visit  Medication Sig Dispense Refill  . clonazePAM (KLONOPIN) 0.5 MG tablet Take 1 tablet (0.5 mg total) by mouth 2 (two) times daily as needed for anxiety. 60 tablet 0  . cyclobenzaprine (FLEXERIL) 10 MG tablet TAKE 1 TABLET(10 MG) BY MOUTH  THREE TIMES DAILY AS NEEDED FOR MUSCLE SPASMS 30 tablet 2  . fluticasone (FLONASE) 50 MCG/ACT nasal spray Place 2 sprays into both nostrils daily. 16 g 6  . meloxicam (MOBIC) 15 MG tablet 1/2 - 1 po qd prn 30 tablet 5  . traMADol (ULTRAM) 50 MG tablet TAKE 1 TABLET BY MOUTH EVERY 8 HOURS AS NEEDED 30 tablet 0   No current facility-administered medications on file prior to visit.    BP 120/80 mmHg  Pulse 83  Temp(Src) 98.7 F (37.1 C) (Oral)  Ht 5\' 9"  (1.753 m)  Wt 217 lb 9.6 oz (98.703 kg)  BMI 32.12 kg/m2  SpO2 97%  LMP 01/28/2016       Objective:   Physical Exam  General  Mental Status - Alert. General Appearance - Well groomed. Not in acute distress.  Skin Rashes- No Rashes.  HEENT Head- Normal. Ear Auditory  Canal - Left- Normal. Right - Normal.Tympanic Membrane- Left- Normal. Right- Normal. Eye Sclera/Conjunctiva- Left- Normal. Right- Normal. Nose & Sinuses Nasal Mucosa- Left-  Boggy and Congested. Right-  Boggy and  Congested.Bilateral maxillary  tendernessmoderate to severe.  Mild frontal sinus pressure. Mouth & Throat Lips: Upper Lip- Normal: no dryness, cracking, pallor, cyanosis, or vesicular eruption. Lower Lip-Normal: no dryness, cracking, pallor, cyanosis or vesicular eruption. Buccal Mucosa- Bilateral- No Aphthous ulcers. Oropharynx- No Discharge or Erythema. +pnd Tonsils: Characteristics- Bilateral- No Erythema or Congestion. Size/Enlargement- Bilateral- No enlargement. Discharge- bilateral-None.  Neck Neck- Supple. No Masses.   Chest and Lung Exam Auscultation: Breath Sounds:-Clear even and unlabored.  Cardiovascular Auscultation:Rythm- Regular, rate and rhythm. Murmurs & Other Heart Sounds:Ausculatation of the heart reveal- No Murmurs.  Lymphatic Head & Neck General Head & Neck Lymphatics: Bilateral: Description- No Localized lymphadenopathy.       Assessment & Plan:   You appear to have bronchitis and sinusitis. Rest hydrate and tylenol for fever. I am prescribing codeine cough syrup , and augmentin antibiotic. For your nasal congestion you could use your flonase.  You should gradually get better. If not then notify us and would recommend a chest xray.  If you have any wheezing or shortness of breath then use albuterol  Follow up in 7-10 days or as needed  Pt states has used codeine before without reaction, side effect  or allergy. Pt flu test was negative.

## 2016-02-20 NOTE — Patient Instructions (Addendum)
You appear to have bronchitis and sinusitis. Rest hydrate and tylenol for fever. I am prescribing  codeine cough syrup, and augmentin antibiotic. For your nasal congestion you could use your flonase.  You should gradually get better. If not then notify us and would recommend a chest xray.  If you have any wheezing or shortness of breath then use albuterol  Follow up in 7-10 days or as needed  Pt aware to stop tramadol while on codeine cough syrup.

## 2016-02-20 NOTE — Addendum Note (Signed)
Addended by: Lurline HareARTER, Elenor Wildes E on: 02/20/2016 04:16 PM   Modules accepted: Orders

## 2016-07-02 ENCOUNTER — Other Ambulatory Visit: Payer: Self-pay | Admitting: Obstetrics and Gynecology

## 2016-07-02 DIAGNOSIS — Z1231 Encounter for screening mammogram for malignant neoplasm of breast: Secondary | ICD-10-CM

## 2016-07-23 ENCOUNTER — Encounter (INDEPENDENT_AMBULATORY_CARE_PROVIDER_SITE_OTHER): Payer: BC Managed Care – PPO | Admitting: Ophthalmology

## 2016-07-23 DIAGNOSIS — H33301 Unspecified retinal break, right eye: Secondary | ICD-10-CM

## 2016-07-23 DIAGNOSIS — H43813 Vitreous degeneration, bilateral: Secondary | ICD-10-CM

## 2016-07-28 ENCOUNTER — Ambulatory Visit: Payer: BC Managed Care – PPO

## 2016-07-30 ENCOUNTER — Ambulatory Visit (INDEPENDENT_AMBULATORY_CARE_PROVIDER_SITE_OTHER): Payer: BC Managed Care – PPO | Admitting: Ophthalmology

## 2016-07-30 DIAGNOSIS — H33301 Unspecified retinal break, right eye: Secondary | ICD-10-CM

## 2016-11-05 ENCOUNTER — Other Ambulatory Visit: Payer: Self-pay | Admitting: Family Medicine

## 2016-11-27 ENCOUNTER — Ambulatory Visit: Payer: BC Managed Care – PPO

## 2016-12-15 ENCOUNTER — Ambulatory Visit (INDEPENDENT_AMBULATORY_CARE_PROVIDER_SITE_OTHER): Payer: BC Managed Care – PPO | Admitting: Ophthalmology

## 2016-12-22 ENCOUNTER — Ambulatory Visit (INDEPENDENT_AMBULATORY_CARE_PROVIDER_SITE_OTHER): Payer: BC Managed Care – PPO | Admitting: Ophthalmology

## 2016-12-22 DIAGNOSIS — H33301 Unspecified retinal break, right eye: Secondary | ICD-10-CM | POA: Diagnosis not present

## 2016-12-22 DIAGNOSIS — H5213 Myopia, bilateral: Secondary | ICD-10-CM

## 2016-12-22 DIAGNOSIS — H43813 Vitreous degeneration, bilateral: Secondary | ICD-10-CM

## 2016-12-22 DIAGNOSIS — H2513 Age-related nuclear cataract, bilateral: Secondary | ICD-10-CM | POA: Diagnosis not present

## 2016-12-27 ENCOUNTER — Encounter: Payer: Self-pay | Admitting: Family Medicine

## 2016-12-27 ENCOUNTER — Ambulatory Visit (INDEPENDENT_AMBULATORY_CARE_PROVIDER_SITE_OTHER): Payer: BC Managed Care – PPO | Admitting: Family Medicine

## 2016-12-27 DIAGNOSIS — L0291 Cutaneous abscess, unspecified: Secondary | ICD-10-CM | POA: Diagnosis not present

## 2016-12-27 MED ORDER — MUPIROCIN 2 % EX OINT: TOPICAL_OINTMENT | CUTANEOUS | 0 refills | Status: DC

## 2016-12-27 NOTE — Progress Notes (Signed)
   Subjective:    Patient ID: Suzanne Singh, female    DOB: 1965/12/11, 51 y.o.   MRN: [id number]  HPI  51 year old female pt of Dr. Ernst SpellLowne's presents with new onset pain behind left ear last night. She has felt knot, sore. Friend noted redness of skin behind ear and white pustule. No discharge.  She has also been having  Runny nose, watery eyes.  Dried gunk in eyes. No ST, no internal ear pain, no fever.  No SOB, no wheeze.   ? Bite.. No insect seen.   Amox for sinus infection about 1 month ago. Resolved completely.  HX nonsmoker, has allergies, no asthma No past boils.  Works as Engineer, siteschool teacher.  Review of Systems  Constitutional: Negative for fatigue and fever.  HENT: Negative for ear pain.   Eyes: Negative for pain.  Respiratory: Negative for chest tightness and shortness of breath.   Cardiovascular: Negative for chest pain, palpitations and leg swelling.  Gastrointestinal: Negative for abdominal pain.  Genitourinary: Negative for dysuria.       Objective:   Physical Exam  Constitutional: Vital signs are normal. She appears well-developed and well-nourished. She is cooperative.  Non-toxic appearance. She does not appear ill. No distress.  HENT:  Head: Normocephalic.  Right Ear: Hearing, tympanic membrane, external ear and ear canal normal. Tympanic membrane is not erythematous, not retracted and not bulging.  Left Ear: Hearing, tympanic membrane, external ear and ear canal normal. Tympanic membrane is not erythematous, not retracted and not bulging.  Nose: No mucosal edema or rhinorrhea. Right sinus exhibits no maxillary sinus tenderness and no frontal sinus tenderness. Left sinus exhibits no maxillary sinus tenderness and no frontal sinus tenderness.  Mouth/Throat: Uvula is midline, oropharynx is clear and moist and mucous membranes are normal.  Eyes: Conjunctivae, EOM and lids are normal. Pupils are equal, round, and reactive to light. Lids are everted and swept, no foreign  bodies found.  Neck: Trachea normal and normal range of motion. Neck supple. Carotid bruit is not present. No thyroid mass and no thyromegaly present.  Cardiovascular: Normal rate, regular rhythm, S1 normal, S2 normal, normal heart sounds, intact distal pulses and normal pulses.  Exam reveals no gallop and no friction rub.   No murmur heard. Pulmonary/Chest: Effort normal and breath sounds normal. No tachypnea. No respiratory distress. She has no decreased breath sounds. She has no wheezes. She has no rhonchi. She has no rales.  Abdominal: Soft. Normal appearance and bowel sounds are normal. There is no tenderness.  Neurological: She is alert.  Skin: Skin is warm, dry and intact. No rash noted.  Pea size redness with central pore and pustule and surrounding erythema. Mild ttp.  Psychiatric: Her speech is normal and behavior is normal. Judgment and thought content normal. Her mood appears not anxious. Cognition and memory are normal. She does not exhibit a depressed mood.          Assessment & Plan:

## 2016-12-27 NOTE — Progress Notes (Signed)
Pre visit review using our clinic review tool, if applicable. No additional management support is needed unless otherwise documented below in the visit note. 

## 2016-12-27 NOTE — Assessment & Plan Note (Signed)
Small, limited erythema. Appears ready to drain but to much pain to apply pressure and get culture in office.  Treat with warm compresses and topical antibiotics.  If not improving or draining , pt will contact us.

## 2016-12-27 NOTE — Patient Instructions (Addendum)
Warm compresses 15 min 3-4 times daily I able. Goal is to have the are drain.  Apply daily topical bactroban.  Call if not improving in next 48-78 hours. Call sooner if fever or redness spreading.

## 2017-02-13 ENCOUNTER — Other Ambulatory Visit: Payer: Self-pay | Admitting: Family Medicine

## 2017-02-13 DIAGNOSIS — M169 Osteoarthritis of hip, unspecified: Secondary | ICD-10-CM

## 2017-02-26 ENCOUNTER — Ambulatory Visit (INDEPENDENT_AMBULATORY_CARE_PROVIDER_SITE_OTHER): Payer: BC Managed Care – PPO | Admitting: Medical

## 2017-02-26 VITALS — BP 112/75 | HR 86 | Temp 98.2°F | Wt 209.8 lb

## 2017-02-26 DIAGNOSIS — M2550 Pain in unspecified joint: Secondary | ICD-10-CM | POA: Diagnosis not present

## 2017-02-26 DIAGNOSIS — M5442 Lumbago with sciatica, left side: Secondary | ICD-10-CM | POA: Diagnosis not present

## 2017-02-26 DIAGNOSIS — R002 Palpitations: Secondary | ICD-10-CM

## 2017-02-26 DIAGNOSIS — F439 Reaction to severe stress, unspecified: Secondary | ICD-10-CM

## 2017-02-26 DIAGNOSIS — F419 Anxiety disorder, unspecified: Secondary | ICD-10-CM | POA: Diagnosis not present

## 2017-02-26 MED ORDER — TRAMADOL HCL 50 MG PO TABS: 50.0000 mg | ORAL_TABLET | Freq: Four times a day (QID) | ORAL | 0 refills | Status: DC | PRN

## 2017-02-26 MED ORDER — CLONAZEPAM 0.5 MG PO TABS: 0.5000 mg | ORAL_TABLET | Freq: Two times a day (BID) | ORAL | 0 refills | Status: DC | PRN

## 2017-02-26 MED ORDER — DICLOFENAC SODIUM 75 MG PO TBEC: 75.0000 mg | DELAYED_RELEASE_TABLET | Freq: Two times a day (BID) | ORAL | 0 refills | Status: DC

## 2017-02-26 MED ORDER — KETOROLAC TROMETHAMINE 60 MG/2ML IM SOLN
60.0000 mg | Freq: Once | INTRAMUSCULAR | Status: AC
  Administered 2017-02-26: 60 mg via INTRAMUSCULAR

## 2017-02-26 MED ORDER — CYCLOBENZAPRINE HCL 10 MG PO TABS: 10.0000 mg | ORAL_TABLET | Freq: Every day | ORAL | 0 refills | Status: DC

## 2017-02-26 NOTE — Progress Notes (Signed)
Pre visit review using our clinic review tool, if applicable. No additional management support is needed unless otherwise documented below in the visit note. 

## 2017-02-26 NOTE — Patient Instructions (Addendum)
For your low back pain and arthralgias we gave you toradol 60 mg im. You can use diclofenac tomorrow late afternoon(stop meloxicam). Also rx flexeril 10 mg to use at night next 7 days. For break through pain rx tramadol.   If back pain persists would recommend lumbar xray.  For recent stress and anxiety rx clonopin. Use if need but not at night when you use flexeril.   Your ekg appeared normal rhythm and sounded normal on auscultation. Recommend stop caffeine and avoid any otc decongestants. If you feel palpitations severe and persitent then ED evaluation. Also recommend ED evaluation if any chest pain.  Cardiologist or outpt holter also option for palpitations.  Follow up this coming Tuesday with Dr. Laury AxonLowne. Or as needed(discuss above including your concerns about meloxciam

## 2017-02-26 NOTE — Progress Notes (Signed)
Subjective:    Patient ID: Suzanne Singh, female    DOB: 1966-11-25, 51 y.o.   MRN: [id number]  HPI   Pt in for evaluation. She has hx of arthritis. She recently ran out of meloxicam. (would take intermittently in the past). She also states also bent over today and felt acute onset of low back pain. Some pain that was radiating down her left buttox. No saddle anesthesia, no leg weakness, no incontinence and no foot drop reported.   Both knees hurt, wrist and elbows hurt. Flare of various area of joint pain just recently.  Pt states she has some concern about meloxicam. She has concern about heart effect. Sounds like she is expressing concerns about vioxx.  Pt states she has been having intermittent palpitations that are rare, intermittent and brief. They last for couple of seconds and then goes away. But no chest pain. No jaw pain. No arm pain, no shortness of breath. Pt drinks 2 cups of coffee a day. Had brief in office but lasted on seconds with no other associated sign or symptoms.  Pt is very stressed this week and some anxiety. She wanted to use clonopin but had ran out a while ago.    Review of Systems  Constitutional: Negative for chills, fatigue and fever.  HENT: Negative for congestion, sinus pain and sinus pressure.   Respiratory: Negative for cough, choking, shortness of breath and wheezing.   Cardiovascular: Negative for chest pain and palpitations.  Gastrointestinal: Negative for abdominal pain, nausea and vomiting.  Musculoskeletal: Positive for arthralgias and back pain.  Skin: Negative for rash.  Neurological: Negative for dizziness, weakness, numbness and headaches.  Hematological: Negative for adenopathy. Does not bruise/bleed easily.  Psychiatric/Behavioral: Negative for agitation, confusion, dysphoric mood, self-injury, sleep disturbance and suicidal ideas. The patient is nervous/anxious.        Stressed.   Past Medical History:  Diagnosis Date  . Arrhythmia     . Arthritis   . Depression   . Eating disorder   . Fibromyalgia   . GERD (gastroesophageal reflux disease)   . IBS (irritable bowel syndrome)   . Migraine   . Seizures [facility](HCC)      Social History   Social History  . Marital status: Married    Spouse name: N/A  . Number of children: 2  . Years of education: N/A   Occupational History  .  [employer]   Social History Main Topics  . Smoking status: Never Smoker  . Smokeless tobacco: Never Used  . Alcohol use No     Comment: wine on occasion  . Drug use: No  . Sexual activity: Not on file   Other Topics Concern  . 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   2 sons    No past surgical history on file.  Family History  Problem Relation Age of Onset  . Alcohol abuse Mother   . Arthritis Mother   . Lung cancer Mother   . Hypertension Mother   . Mental illness Mother   . Alcohol abuse Father   . Heart disease Father   . Hypertension Father   . Diabetes Father   . Colon cancer Father   . Early death Sister   . Arthritis Maternal Grandmother   . Hypertension Maternal Grandmother   . Breast cancer Maternal Grandmother   . Stroke  Maternal Grandfather   . Hypertension Maternal Grandfather   . Heart disease Paternal Grandmother   . Hypertension Paternal Grandmother   . Colon cancer Paternal Grandfather   . Heart disease Paternal Grandfather   . Hypertension Paternal Grandfather   . Kidney disease Paternal Grandfather   . Diabetes Paternal Grandfather     Allergies  Allergen Reactions  . Ambien Cr [Zolpidem Tartrate Er]     Seizure   . Cymbalta [Duloxetine Hcl]     Seizure   . Celebrex [Celecoxib]     Hives   . Hydrocodone Itching  . Latex Rash    Adhesive    Current Outpatient Prescriptions on File Prior to Visit  Medication Sig Dispense Refill  . albuterol  (PROVENTIL HFA;VENTOLIN HFA) 108 (90 Base) MCG/ACT inhaler Inhale 2 puffs into the lungs every 6 (six) hours as needed for wheezing or shortness of breath. 1 Inhaler 0  . clonazePAM (KLONOPIN) 0.5 MG tablet Take 1 tablet (0.5 mg total) by mouth 2 (two) times daily as needed for anxiety. 60 tablet 0  . cyclobenzaprine (FLEXERIL) 10 MG tablet TAKE 1 TABLET(10 MG) BY MOUTH THREE TIMES DAILY AS NEEDED FOR MUSCLE SPASMS 30 tablet 2  . fluticasone (FLONASE) 50 MCG/ACT nasal spray Place 2 sprays into both nostrils daily. 16 g 6  . meloxicam (MOBIC) 15 MG tablet 1/2 - 1 po qd prn 30 tablet 5  . mupirocin ointment (BACTROBAN) 2 % 1 application to affected area daily 15 g 0   No current facility-administered medications on file prior to visit.     BP 112/75   Pulse 86   Temp 98.2 F (36.8 C) (Oral)   Wt 209 lb 12.8 oz (95.2 kg)   SpO2 100%   BMI 30.98 kg/m       Objective:   Physical Exam  General Appearance- Not in acute distress.  Neck  No carotid bruits. No JVD. Trapezius muscles tight  Chest and Lung Exam Auscultation: Breath sounds:-Normal. Clear even and unlabored. Adventitious sounds:- No Adventitious sounds.  Cardiovascular Auscultation:Rythm - Regular, rate and rythm. Heart Sounds -Normal heart sounds.  Abdomen Inspection:-Inspection Normal.  Palpation/Perucssion: Palpation and Percussion of the abdomen reveal- Non Tender, No Rebound tenderness, No rigidity(Guarding) and No Palpable abdominal masses.  Liver:-Normal.  Spleen:- Normal.   Back Mid lumbar spine tenderness to palpation. Pain on straight leg lift. Pain on lateral movements and flexion/extension of the spine.  Lower ext neurologic  L5-S1 sensation intact bilaterally. Normal patellar reflexes bilaterally. No foot drop bilaterally.   Neurologic Cranial Nerve exam:- CN III-XII intact(No nystagmus), symmetric smile. Strength:- 5/5 equal and symmetric strength both upper and lower extremities.        Assessment & Plan:  ekg normal sinus rhythm on review. Compared to prior ekg. No significant change.  For your low back pain and arthralgias we gave you toradol 60 mg im. You can use diclofenac tomorrow late afternoon(stop meloxicam). Also rx flexeril 10 mg to use at night next 7 days. For break through pain rx tramadol.   If back pain persists would recommend lumbar xray.  For recent stress and anxiety rx clonopin. Use if need but not at night when you use flexeril.   Your ekg appeared normal rhythm and sounded normal on auscultation. Recommend stop caffeine and avoid any otc decongestants. If you feel palpitations severe and persitent then ED evaluation. Also recommend ED evaluation if any chest pain.  Follow up this coming Tuesday with Dr. Laury Axon. Or as needed(discuss  above including your concerns about meloxciam  Note rx advisement on diclofenac. Prior use of alleve, ibuprofen no allergy. So rx'd.  Melodee Lupe, Ramon Dredge, PA-C

## 2017-03-12 ENCOUNTER — Ambulatory Visit (HOSPITAL_BASED_OUTPATIENT_CLINIC_OR_DEPARTMENT_OTHER)
Admission: RE | Admit: 2017-03-12 | Discharge: 2017-03-12 | Disposition: A | Discharge status: Home / Self Care | Payer: BC Managed Care – PPO | Source: Ambulatory Visit | Attending: Family Medicine | Admitting: Family Medicine

## 2017-03-12 ENCOUNTER — Encounter: Payer: Self-pay | Admitting: Family Medicine

## 2017-03-12 ENCOUNTER — Ambulatory Visit (INDEPENDENT_AMBULATORY_CARE_PROVIDER_SITE_OTHER): Payer: BC Managed Care – PPO | Admitting: Family Medicine

## 2017-03-12 VITALS — BP 108/78 | HR 73 | Temp 98.3°F | Resp 16 | Ht 69.0 in | Wt 211.8 lb

## 2017-03-12 DIAGNOSIS — M549 Dorsalgia, unspecified: Secondary | ICD-10-CM

## 2017-03-12 DIAGNOSIS — M79605 Pain in left leg: Secondary | ICD-10-CM | POA: Diagnosis present

## 2017-03-12 DIAGNOSIS — M545 Low back pain: Secondary | ICD-10-CM | POA: Insufficient documentation

## 2017-03-12 DIAGNOSIS — M5136 Other intervertebral disc degeneration, lumbar region: Secondary | ICD-10-CM | POA: Diagnosis not present

## 2017-03-12 LAB — POC URINALSYSI DIPSTICK (AUTOMATED)
Bilirubin, UA: NEGATIVE
Glucose, UA: NEGATIVE
KETONES UA: NEGATIVE
Leukocytes, UA: NEGATIVE
Nitrite, UA: NEGATIVE
PROTEIN UA: NEGATIVE
RBC UA: NEGATIVE
SPEC GRAV UA: 1.015 (ref 1.030–1.035)
Urobilinogen, UA: NEGATIVE (ref ?–2.0)
pH, UA: 7.5 (ref 5.0–8.0)

## 2017-03-12 MED ORDER — TRAMADOL HCL 50 MG PO TABS: 50.0000 mg | ORAL_TABLET | Freq: Four times a day (QID) | ORAL | 0 refills | Status: DC | PRN

## 2017-03-12 MED ORDER — CYCLOBENZAPRINE HCL 10 MG PO TABS: 10.0000 mg | ORAL_TABLET | Freq: Every day | ORAL | 0 refills | Status: DC

## 2017-03-12 NOTE — Progress Notes (Signed)
Subjective:  I acted as a Neurosurgeon for Dr. Delman Kitten, LPN    Patient ID: Suzanne Singh, female    DOB: 07/10/1966, 51 y.o.   MRN: [id number]  Chief Complaint  Patient presents with  . Back Pain    follow up    Back Pain  This is a recurrent problem. The current episode started 1 to 4 weeks ago. Associated symptoms include tingling. Pertinent negatives include no chest pain, fever or headaches.    Patient is in today for follow up on back pain. Patient state she is still having back pain, and she stopped taking the Voltaren because she felt tingling on left side of leg, swelling, and weight gain 202 to 211.8 within 2 weeks. Patient would like to have urine checked again. No known injury Patient Care Team: Donato Schultz, DO as PCP - General (Family Medicine) Richardean Chimera, MD as Consulting Physician (Obstetrics and Gynecology)   Past Medical History:  Diagnosis Date  . Arrhythmia   . Arthritis   . Depression   . Eating disorder   . Fibromyalgia   . GERD (gastroesophageal reflux disease)   . IBS (irritable bowel syndrome)   . Migraine   . Seizures (HCC)     No past surgical history on file.  Family History  Problem Relation Age of Onset  . Alcohol abuse Mother   . Arthritis Mother   . Lung cancer Mother   . Hypertension Mother   . Mental illness Mother   . Alcohol abuse Father   . Heart disease Father   . Hypertension Father   . Diabetes Father   . Colon cancer Father   . Early death Sister   . Arthritis Maternal Grandmother   . Hypertension Maternal Grandmother   . Breast cancer Maternal Grandmother   . Stroke Maternal Grandfather   . Hypertension Maternal Grandfather   . Heart disease Paternal Grandmother   . Hypertension Paternal Grandmother   . Colon cancer Paternal Grandfather   . Heart disease Paternal Grandfather   . Hypertension Paternal Grandfather   . Kidney disease Paternal Grandfather   . Diabetes Paternal Grandfather     Social History    Social History  . Marital status: Married    Spouse name: N/A  . Number of children: 2  . Years of education: N/A   Occupational History  .  [employer]   Social History Main Topics  . Smoking status: Never Smoker  . Smokeless tobacco: Never Used  . Alcohol use No     Comment: wine on occasion  . Drug use: No  . Sexual activity: Not on file   Other Topics Concern  . 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   2 sons    Outpatient Medications Prior to Visit  Medication Sig Dispense Refill  . albuterol (PROVENTIL HFA;VENTOLIN HFA) 108 (90 Base) MCG/ACT inhaler Inhale 2 puffs into the lungs every 6 (six) hours as needed for wheezing or shortness of breath. 1 Inhaler 0  . clonazePAM (KLONOPIN) 0.5 MG tablet Take 1 tablet (0.5 mg total) by mouth 2 (two) times daily as needed for anxiety. 30 tablet 0  . fluticasone (FLONASE) 50 MCG/ACT nasal spray Place 2 sprays into both nostrils daily. 16 g 6  . mupirocin ointment (BACTROBAN) 2 % 1 application to affected area daily 15 g 0  .  cyclobenzaprine (FLEXERIL) 10 MG tablet Take 1 tablet (10 mg total) by mouth at bedtime. 7 tablet 0  . traMADol (ULTRAM) 50 MG tablet Take 1 tablet (50 mg total) by mouth every 6 (six) hours as needed for severe pain. 16 tablet 0  . diclofenac (VOLTAREN) 75 MG EC tablet Take 1 tablet (75 mg total) by mouth 2 (two) times daily. (Patient not taking: Reported on 03/12/2017) 30 tablet 0   No facility-administered medications prior to visit.     Allergies  Allergen Reactions  . Ambien Cr [Zolpidem Tartrate Er]     Seizure   . Cymbalta [Duloxetine Hcl]     Seizure   . Celebrex [Celecoxib]     Hives   . Hydrocodone Itching  . Latex Rash    Adhesive    Review of Systems  Constitutional: Negative for fever.  HENT: Negative for congestion.   Eyes: Negative for  blurred vision.  Respiratory: Negative for cough.   Cardiovascular: Negative for chest pain and palpitations.  Gastrointestinal: Negative for vomiting.  Musculoskeletal: Positive for back pain.  Skin: Negative for rash.  Neurological: Positive for tingling. Negative for loss of consciousness and headaches.       Objective:    Physical Exam  Constitutional: She is oriented to person, place, and time. She appears well-developed and well-nourished. No distress.  HENT:  Head: Normocephalic and atraumatic.  Eyes: Conjunctivae are normal. Pupils are equal, round, and reactive to light.  Neck: Normal range of motion. No thyromegaly present.  Cardiovascular: Normal rate and regular rhythm.   Pulmonary/Chest: Effort normal and breath sounds normal. She has no wheezes.  Abdominal: Soft. Bowel sounds are normal. There is no tenderness.  Musculoskeletal: Normal range of motion. She exhibits tenderness. She exhibits no edema or deformity.       Legs: Neurological: She is alert and oriented to person, place, and time. She has normal reflexes.  Numbness down L leg to foot No weakness in low ext slr + 45degrees on Left Neg on right  Skin: Skin is warm and dry. She is not diaphoretic.  Psychiatric: She has a normal mood and affect.  Nursing note and vitals reviewed.   BP 108/78 (BP Location: Left Arm, Patient Position: Sitting, Cuff Size: Normal)   Pulse 73   Temp 98.3 F (36.8 C)   Resp 16   Ht  (1.753 m)   Wt 211 lb 12.8 oz (96.1 kg)   LMP 03/11/2017   SpO2 97%   BMI 31.28 kg/m  Wt Readings from Last 3 Encounters:  03/12/17 211 lb 12.8 oz (96.1 kg)  02/26/17 209 lb 12.8 oz (95.2 kg)  12/27/16 207 lb (93.9 kg)   BP Readings from Last 3 Encounters:  03/12/17 108/78  02/26/17 112/75  12/27/16 110/70     Immunization History  Administered Date(s) Administered  . Influenza,inj,Quad PF,36+ Mos 09/21/2014, 12/06/2015  . Tdap 12/08/2006    Health Maintenance  Topic Date  Due  . HIV Screening  01/03/1983  . MAMMOGRAM  07/25/2016  . TETANUS/TDAP  12/08/2016  . INFLUENZA VACCINE  07/08/2017  . PAP SMEAR  07/10/2018    Lab Results  Component Value Date   WBC 4.4 12/06/2015   HGB 12.3 12/06/2015   HCT 38.1 12/06/2015   PLT 329.0 12/06/2015   GLUCOSE 87 12/06/2015   CHOL 166 12/06/2015   TRIG 66.0 12/06/2015   HDL 52.30 12/06/2015   LDLCALC 101 (H) 12/06/2015   ALT 9 12/06/2015   AST  17 12/06/2015   NA 140 12/06/2015   K 3.9 12/06/2015   CL 104 12/06/2015   CREATININE 0.75 12/06/2015   BUN 15 12/06/2015   CO2 29 12/06/2015   TSH 1.23 12/06/2015    Lab Results  Component Value Date   TSH 1.23 12/06/2015   Lab Results  Component Value Date   WBC 4.4 12/06/2015   HGB 12.3 12/06/2015   HCT 38.1 12/06/2015   MCV 90.3 12/06/2015   PLT 329.0 12/06/2015   Lab Results  Component Value Date   NA 140 12/06/2015   K 3.9 12/06/2015   CO2 29 12/06/2015   GLUCOSE 87 12/06/2015   BUN 15 12/06/2015   CREATININE 0.75 12/06/2015   BILITOT 0.3 12/06/2015   ALKPHOS 85 12/06/2015   AST 17 12/06/2015   ALT 9 12/06/2015   PROT 7.4 12/06/2015   ALBUMIN 4.0 12/06/2015   CALCIUM 9.2 12/06/2015   GFR 106.10 12/06/2015   Lab Results  Component Value Date   CHOL 166 12/06/2015   Lab Results  Component Value Date   HDL 52.30 12/06/2015   Lab Results  Component Value Date   LDLCALC 101 (H) 12/06/2015   Lab Results  Component Value Date   TRIG 66.0 12/06/2015   Lab Results  Component Value Date   CHOLHDL 3 12/06/2015   No results found for: HGBA1C       Assessment & Plan:   Problem List Items Addressed This Visit    None    Visit Diagnoses    Back pain, unspecified back location, unspecified back pain laterality, unspecified chronicity    -  Primary   Relevant Medications   traMADol (ULTRAM) 50 MG tablet   cyclobenzaprine (FLEXERIL) 10 MG tablet   Other Relevant Orders   POCT Urinalysis Dipstick (Automated) (Completed)   DG  Lumbar Spine Complete    ice / heat alternating meds per orders Xray today rto 2 weeks if symptoms do not improve or worsen I am having Ms. Hengel maintain her fluticasone, albuterol, mupirocin ointment, diclofenac, clonazePAM, traMADol, and cyclobenzaprine.  Meds ordered this encounter  Medications  . traMADol (ULTRAM) 50 MG tablet    Sig: Take 1 tablet (50 mg total) by mouth every 6 (six) hours as needed for severe pain.    Dispense:  30 tablet    Refill:  0  . cyclobenzaprine (FLEXERIL) 10 MG tablet    Sig: Take 1 tablet (10 mg total) by mouth at bedtime.    Dispense:  30 tablet    Refill:  0    CMA served as scribe during this visit. History, Physical and Plan performed by medical provider. Documentation and orders reviewed and attested to.  Donato Schultz, DO   Patient ID: Suzanne Singh, female   DOB: 10/09/1966, 51 y.o.   MRN: [id number]

## 2017-03-12 NOTE — Patient Instructions (Signed)
Back Pain, Adult  Back pain is very common. The pain often gets better over time. The cause of back pain is usually not dangerous. Most people can learn to manage their back pain on their own.  Follow these instructions at home:  Watch your back pain for any changes. The following actions may help to lessen any pain you are feeling:  · Stay active. Start with short walks on flat ground if you can. Try to walk farther each day.  · Exercise regularly as told by your doctor. Exercise helps your back heal faster. It also helps avoid future injury by keeping your muscles strong and flexible.  · Do not sit, drive, or stand in one place for more than 30 minutes.  · Do not stay in bed. Resting more than 1-2 days can slow down your recovery.  · Be careful when you bend or lift an object. Use good form when lifting:  ? Bend at your knees.  ? Keep the object close to your body.  ? Do not twist.  · Sleep on a firm mattress. Lie on your side, and bend your knees. If you lie on your back, put a pillow under your knees.  · Take medicines only as told by your doctor.  · Put ice on the injured area.  ? Put ice in a plastic bag.  ? Place a towel between your skin and the bag.  ? Leave the ice on for 20 minutes, 2-3 times a day for the first 2-3 days. After that, you can switch between ice and heat packs.  · Avoid feeling anxious or stressed. Find good ways to deal with stress, such as exercise.  · Maintain a healthy weight. Extra weight puts stress on your back.    Contact a doctor if:  · You have pain that does not go away with rest or medicine.  · You have worsening pain that goes down into your legs or buttocks.  · You have pain that does not get better in one week.  · You have pain at night.  · You lose weight.  · You have a fever or chills.  Get help right away if:  · You cannot control when you poop (bowel movement) or pee (urinate).  · Your arms or legs feel weak.  · Your arms or legs lose feeling (numbness).  · You feel sick  to your stomach (nauseous) or throw up (vomit).  · You have belly (abdominal) pain.  · You feel like you may pass out (faint).  This information is not intended to replace advice given to you by your health care provider. Make sure you discuss any questions you have with your health care provider.  Document Released: 05/12/2008 Document Revised: 05/01/2016 Document Reviewed: 03/28/2014  Elsevier Interactive Patient Education © 2017 Elsevier Inc.

## 2017-03-12 NOTE — Progress Notes (Signed)
Pre visit review using our clinic review tool, if applicable. No additional management support is needed unless otherwise documented below in the visit note. 

## 2017-03-13 ENCOUNTER — Other Ambulatory Visit: Payer: Self-pay | Admitting: Family Medicine

## 2017-03-13 DIAGNOSIS — M5442 Lumbago with sciatica, left side: Principal | ICD-10-CM

## 2017-03-13 DIAGNOSIS — G8929 Other chronic pain: Secondary | ICD-10-CM

## 2017-03-16 ENCOUNTER — Other Ambulatory Visit: Payer: Self-pay | Admitting: Family Medicine

## 2017-03-16 MED ORDER — MELOXICAM 15 MG PO TABS: ORAL_TABLET | ORAL | 2 refills | Status: DC

## 2017-03-16 NOTE — Telephone Encounter (Signed)
Called left message to call back 

## 2017-03-16 NOTE — Telephone Encounter (Signed)
Caller name: Willean Relationship to patient: self Can be reached: [phone] Pharmacy: [id number] - [place], [place] - [address]  Reason for call: Pt states a lot of swelling and wanting to know if she is to use meloxicam for inflammation in addition to med last week. Please call into pahrmacy for her.

## 2017-03-16 NOTE — Telephone Encounter (Signed)
Clarified her request and she wants meloxicam refilled to [facility] in [address]

## 2017-03-16 NOTE — Telephone Encounter (Signed)
Ok to refill x1  2 refills 

## 2017-03-16 NOTE — Telephone Encounter (Signed)
She can if she needs it

## 2017-03-16 NOTE — Telephone Encounter (Signed)
rx sent in and left message on machine  

## 2017-04-23 ENCOUNTER — Ambulatory Visit (INDEPENDENT_AMBULATORY_CARE_PROVIDER_SITE_OTHER): Payer: BC Managed Care – PPO | Admitting: Specialist

## 2017-04-24 ENCOUNTER — Encounter: Payer: Self-pay | Admitting: Family Medicine

## 2017-04-24 ENCOUNTER — Ambulatory Visit (INDEPENDENT_AMBULATORY_CARE_PROVIDER_SITE_OTHER): Payer: BC Managed Care – PPO | Admitting: Family Medicine

## 2017-04-24 VITALS — BP 116/80 | HR 105 | Temp 98.2°F | Resp 16 | Ht 69.0 in | Wt 208.0 lb

## 2017-04-24 DIAGNOSIS — J069 Acute upper respiratory infection, unspecified: Secondary | ICD-10-CM

## 2017-04-24 DIAGNOSIS — R059 Cough, unspecified: Secondary | ICD-10-CM

## 2017-04-24 DIAGNOSIS — R0981 Nasal congestion: Secondary | ICD-10-CM

## 2017-04-24 DIAGNOSIS — R05 Cough: Secondary | ICD-10-CM | POA: Diagnosis not present

## 2017-04-24 DIAGNOSIS — R52 Pain, unspecified: Secondary | ICD-10-CM

## 2017-04-24 DIAGNOSIS — B9789 Other viral agents as the cause of diseases classified elsewhere: Secondary | ICD-10-CM

## 2017-04-24 DIAGNOSIS — R6883 Chills (without fever): Secondary | ICD-10-CM

## 2017-04-24 DIAGNOSIS — J324 Chronic pansinusitis: Secondary | ICD-10-CM

## 2017-04-24 MED ORDER — PROMETHAZINE-DM 6.25-15 MG/5ML PO SYRP: 5.0000 mL | ORAL_SOLUTION | Freq: Four times a day (QID) | ORAL | 0 refills | Status: DC | PRN

## 2017-04-24 MED ORDER — AMOXICILLIN-POT CLAVULANATE 875-125 MG PO TABS: 1.0000 | ORAL_TABLET | Freq: Two times a day (BID) | ORAL | 0 refills | Status: DC

## 2017-04-24 MED ORDER — FLUTICASONE PROPIONATE 50 MCG/ACT NA SUSP: 2.0000 | Freq: Every day | NASAL | 6 refills | Status: AC

## 2017-04-24 NOTE — Patient Instructions (Signed)

## 2017-04-24 NOTE — Progress Notes (Signed)
Patient ID: Suzanne Singh, female   DOB: January 22, 1966, 51 y.o.   MRN: [id number]     Subjective:  I acted as a Neurosurgeon for Dr. Zola Button.  Apolonio Schneiders, CMA   Patient ID: Suzanne Singh, female    DOB: 22-Feb-1966, 51 y.o.   MRN: [id number]  Chief Complaint  Patient presents with  . Sinusitis    Sinusitis  This is a new problem. Episode onset: Wedesday. Associated symptoms include chills, congestion, coughing, headaches, sinus pressure and a sore throat. Pertinent negatives include no shortness of breath. (Ears feel clogged ) Treatments tried: Xyzal, Zyrtec, Robitussin, Thermaflu. The treatment provided no relief.    Patient is in today for sinusitis.  Patient Care Team: Zola Button, Grayling Congress, DO as PCP - General (Family Medicine) Richardean Chimera, MD as Consulting Physician (Obstetrics and Gynecology)   Past Medical History:  Diagnosis Date  . Arrhythmia   . Arthritis   . Depression   . Eating disorder   . Fibromyalgia   . GERD (gastroesophageal reflux disease)   . IBS (irritable bowel syndrome)   . Migraine   . Seizures (HCC)     No past surgical history on file.  Family History  Problem Relation Age of Onset  . Alcohol abuse Mother   . Arthritis Mother   . Lung cancer Mother   . Hypertension Mother   . Mental illness Mother   . Alcohol abuse Father   . Heart disease Father   . Hypertension Father   . Diabetes Father   . Colon cancer Father   . Early death Sister   . Arthritis Maternal Grandmother   . Hypertension Maternal Grandmother   . Breast cancer Maternal Grandmother   . Stroke Maternal Grandfather   . Hypertension Maternal Grandfather   . Heart disease Paternal Grandmother   . Hypertension Paternal Grandmother   . Colon cancer Paternal Grandfather   . Heart disease Paternal Grandfather   . Hypertension Paternal Grandfather   . Kidney disease Paternal Grandfather   . Diabetes Paternal Grandfather     Social History   Social History  . Marital status:  Married    Spouse name: N/A  . Number of children: 2  . Years of education: N/A   Occupational History  .  [employer]   Social History Main Topics  . Smoking status: Never Smoker  . Smokeless tobacco: Never Used  . Alcohol use No     Comment: wine on occasion  . Drug use: No  . Sexual activity: Not on file   Other Topics Concern  . 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   2 sons    Outpatient Medications Prior to Visit  Medication Sig Dispense Refill  . albuterol (PROVENTIL HFA;VENTOLIN HFA) 108 (90 Base) MCG/ACT inhaler Inhale 2 puffs into the lungs every 6 (six) hours as needed for wheezing or shortness of breath. 1 Inhaler 0  . clonazePAM (KLONOPIN) 0.5 MG tablet Take 1 tablet (0.5 mg total) by mouth 2 (two) times daily as needed for anxiety. 30 tablet 0  . cyclobenzaprine (FLEXERIL) 10 MG tablet Take 1 tablet (10 mg total) by mouth at bedtime. 30 tablet 0  . meloxicam (MOBIC) 15 MG tablet Take 1/2 to 1 tablet every day 30 tablet 2  . mupirocin ointment (BACTROBAN) 2 % 1 application to affected area daily 15 g 0  .  traMADol (ULTRAM) 50 MG tablet Take 1 tablet (50 mg total) by mouth every 6 (six) hours as needed for severe pain. 30 tablet 0  . fluticasone (FLONASE) 50 MCG/ACT nasal spray Place 2 sprays into both nostrils daily. 16 g 6  . diclofenac (VOLTAREN) 75 MG EC tablet Take 1 tablet (75 mg total) by mouth 2 (two) times daily. 30 tablet 0   No facility-administered medications prior to visit.     Allergies  Allergen Reactions  . Ambien Cr [Zolpidem Tartrate Er]     Seizure   . Cymbalta [Duloxetine Hcl]     Seizure   . Celebrex [Celecoxib]     Hives   . Hydrocodone Itching  . Latex Rash    Adhesive    Review of Systems  Constitutional: Positive for chills. Negative for fever and malaise/fatigue.  HENT: Positive for  congestion, sinus pain, sinus pressure and sore throat.        Nasal drainage Itchy eyes   Eyes: Negative for blurred vision.  Respiratory: Positive for cough. Negative for shortness of breath.        Coughing up yellow mucus   Cardiovascular: Negative for chest pain, palpitations and leg swelling.  Gastrointestinal: Negative for vomiting.  Musculoskeletal: Negative for back pain.  Skin: Negative for rash.  Neurological: Positive for headaches. Negative for loss of consciousness.       Objective:    Physical Exam  Constitutional: She is oriented to person, place, and time. She appears well-developed and well-nourished. No distress.  HENT:  Head: Normocephalic and atraumatic.  Right Ear: Hearing, tympanic membrane, external ear and ear canal normal.  Left Ear: Hearing, tympanic membrane, external ear and ear canal normal.  Nose: Rhinorrhea and sinus tenderness present. Right sinus exhibits maxillary sinus tenderness and frontal sinus tenderness. Left sinus exhibits maxillary sinus tenderness and frontal sinus tenderness.  Mouth/Throat: Posterior oropharyngeal erythema present.  + PND + errythema  Eyes: Conjunctivae are normal. Right eye exhibits no discharge. Left eye exhibits no discharge.  Neck: Normal range of motion. No thyromegaly present.  Cardiovascular: Normal rate, regular rhythm and normal heart sounds.   No murmur heard. Pulmonary/Chest: Effort normal and breath sounds normal. No respiratory distress. She has no wheezes. She has no rales. She exhibits no tenderness.  Abdominal: Soft. Bowel sounds are normal. There is no tenderness.  Musculoskeletal: Normal range of motion. She exhibits no edema or deformity.  Lymphadenopathy:    She has cervical adenopathy.  Neurological: She is alert and oriented to person, place, and time.  Skin: Skin is warm and dry. She is not diaphoretic.  Psychiatric: She has a normal mood and affect.  Nursing note and vitals reviewed.   BP  116/80 (BP Location: Left Arm, Cuff Size: Normal)   Pulse (!) 105   Temp 98.2 F (36.8 C) (Oral)   Resp 16   Ht 5\' 9"  (1.753 m)   Wt 208 lb (94.3 kg)   LMP 04/21/2017   SpO2 98%   BMI 30.72 kg/m  Wt Readings from Last 3 Encounters:  04/24/17 208 lb (94.3 kg)  03/12/17 211 lb 12.8 oz (96.1 kg)  02/26/17 209 lb 12.8 oz (95.2 kg)   BP Readings from Last 3 Encounters:  04/24/17 116/80  03/12/17 108/78  02/26/17 112/75     Immunization History  Administered Date(s) Administered  . Influenza,inj,Quad PF,36+ Mos 09/21/2014, 12/06/2015  . Tdap 12/08/2006    Health Maintenance  Topic Date Due  . HIV Screening  01/03/1983  .  MAMMOGRAM  07/25/2016  . TETANUS/TDAP  12/08/2016  . INFLUENZA VACCINE  07/08/2017  . PAP SMEAR  07/10/2018    Lab Results  Component Value Date   WBC 4.4 12/06/2015   HGB 12.3 12/06/2015   HCT 38.1 12/06/2015   PLT 329.0 12/06/2015   GLUCOSE 87 12/06/2015   CHOL 166 12/06/2015   TRIG 66.0 12/06/2015   HDL 52.30 12/06/2015   LDLCALC 101 (H) 12/06/2015   ALT 9 12/06/2015   AST 17 12/06/2015   NA 140 12/06/2015   K 3.9 12/06/2015   CL 104 12/06/2015   CREATININE 0.75 12/06/2015   BUN 15 12/06/2015   CO2 29 12/06/2015   TSH 1.23 12/06/2015    Lab Results  Component Value Date   TSH 1.23 12/06/2015   Lab Results  Component Value Date   WBC 4.4 12/06/2015   HGB 12.3 12/06/2015   HCT 38.1 12/06/2015   MCV 90.3 12/06/2015   PLT 329.0 12/06/2015   Lab Results  Component Value Date   NA 140 12/06/2015   K 3.9 12/06/2015   CO2 29 12/06/2015   GLUCOSE 87 12/06/2015   BUN 15 12/06/2015   CREATININE 0.75 12/06/2015   BILITOT 0.3 12/06/2015   ALKPHOS 85 12/06/2015   AST 17 12/06/2015   ALT 9 12/06/2015   PROT 7.4 12/06/2015   ALBUMIN 4.0 12/06/2015   CALCIUM 9.2 12/06/2015   GFR 106.10 12/06/2015   Lab Results  Component Value Date   CHOL 166 12/06/2015   Lab Results  Component Value Date   HDL 52.30 12/06/2015   Lab Results    Component Value Date   LDLCALC 101 (H) 12/06/2015   Lab Results  Component Value Date   TRIG 66.0 12/06/2015   Lab Results  Component Value Date   CHOLHDL 3 12/06/2015   No results found for: HGBA1C       Assessment & Plan:   Problem List Items Addressed This Visit      Unprioritized   Viral URI with cough    con't steroid nasal spray And antihistamine Call or rto if no better in 10-14 days       Relevant Medications   fluticasone (FLONASE) 50 MCG/ACT nasal spray    Other Visit Diagnoses    Pansinusitis, unspecified chronicity    -  Primary   Relevant Medications   promethazine-dextromethorphan (PROMETHAZINE-DM) 6.25-15 MG/5ML syrup   amoxicillin-clavulanate (AUGMENTIN) 875-125 MG tablet   fluticasone (FLONASE) 50 MCG/ACT nasal spray   Cough       Relevant Medications   promethazine-dextromethorphan (PROMETHAZINE-DM) 6.25-15 MG/5ML syrup   Other Relevant Orders   POCT Influenza A/B   Body aches       Relevant Orders   POCT Influenza A/B   Chills       Relevant Orders   POCT Influenza A/B   Nasal congestion       Relevant Orders   POCT Influenza A/B      I have discontinued Suzanne Singh's diclofenac. I am also having her start on promethazine-dextromethorphan and amoxicillin-clavulanate. Additionally, I am having her maintain her albuterol, mupirocin ointment, clonazePAM, traMADol, cyclobenzaprine, meloxicam, and fluticasone.  Meds ordered this encounter  Medications  . promethazine-dextromethorphan (PROMETHAZINE-DM) 6.25-15 MG/5ML syrup    Sig: Take 5 mLs by mouth 4 (four) times daily as needed.    Dispense:  118 mL    Refill:  0  . amoxicillin-clavulanate (AUGMENTIN) 875-125 MG tablet    Sig: Take 1 tablet by mouth  2 (two) times daily.    Dispense:  20 tablet    Refill:  0  . fluticasone (FLONASE) 50 MCG/ACT nasal spray    Sig: Place 2 sprays into both nostrils daily.    Dispense:  16 g    Refill:  6    CMA served as scribe during this visit.  History, Physical and Plan performed by medical provider. Documentation and orders reviewed and attested to.  Donato Schultz, DO

## 2017-04-25 DIAGNOSIS — B9789 Other viral agents as the cause of diseases classified elsewhere: Secondary | ICD-10-CM

## 2017-04-25 DIAGNOSIS — J069 Acute upper respiratory infection, unspecified: Secondary | ICD-10-CM | POA: Insufficient documentation

## 2017-04-25 NOTE — Assessment & Plan Note (Signed)
con't steroid nasal spray And antihistamine Call or rto if no better in 10-14 days

## 2017-04-28 LAB — POCT INFLUENZA A/B
Influenza A, POC: NEGATIVE
Influenza B, POC: NEGATIVE

## 2017-05-01 ENCOUNTER — Ambulatory Visit (INDEPENDENT_AMBULATORY_CARE_PROVIDER_SITE_OTHER): Payer: BC Managed Care – PPO | Admitting: Specialist

## 2017-05-01 ENCOUNTER — Encounter (INDEPENDENT_AMBULATORY_CARE_PROVIDER_SITE_OTHER): Payer: Self-pay | Admitting: Specialist

## 2017-05-01 VITALS — BP 112/71 | HR 81 | Ht 69.0 in | Wt 211.0 lb

## 2017-05-01 DIAGNOSIS — M47816 Spondylosis without myelopathy or radiculopathy, lumbar region: Secondary | ICD-10-CM

## 2017-05-01 NOTE — Patient Instructions (Addendum)
Avoid bending, stooping and avoid lifting weights greater than 10 lbs. Avoid prolong standing and walking. Avoid frequent bending and stooping  No lifting greater than 10 lbs. May use ice or moist heat for pain. Weight loss is of benefit. Handicap license is approved. Dr. Stanley BlasNewton's secretary/Assistant will call to arrange for lumbar facet joint steroid injections and evaluation for consideration of RFA of the facet joint nerves.  Tylenol arthritis strength up to 3-4 times per day.

## 2017-05-01 NOTE — Progress Notes (Signed)
Office Visit Note   Patient: Suzanne Singh           Date of Birth: 19-Jun-1966           MRN: [id number] Visit Date: 05/01/2017              Requested by: [address], [place], [place] [address] [place], [place] [address] PCP: Zola Button, Grayling Congress, DO   Assessment & Plan: Visit Diagnoses:  1. Spondylosis without myelopathy or radiculopathy, lumbar region     Plan: Avoid bending, stooping and avoid lifting weights greater than 10 lbs. Avoid prolong standing and walking. Avoid frequent bending and stooping  No lifting greater than 10 lbs. May use ice or moist heat for pain. Weight loss is of benefit. Handicap license is approved. Dr. Blair Blas secretary/Assistant will call to arrange for lumbar facet joint steroid injections and evaluation for consideration of RFA of the facet joint nerves.  Tylenol arthritis strength up to 3-4 times per day.    Follow-Up Instructions: Return in about 6 weeks (around 06/12/2017).   Orders:  No orders of the defined types were placed in this encounter.  No orders of the defined types were placed in this encounter.     Procedures: No procedures performed   Clinical Data: No additional findings.   Subjective: Chief Complaint  Patient presents with  . Lower Back - Pain    51 year old female with history of left knee pain in the past, had onst of low back pain and was seen at [facility] by the physician's assistant, placed on diclofenac But the diclofenac caused heart palpitations. 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 She has been teaching for 29 years in [place] and mostly in [place]. Has 38 year old son and 49 year old female at [employer]. Pain with standing, sitting too long, or with sleeping too long. Left hip is painful, in the past she would exercise by running And walking. Last tried to walk last summer, but experienced left knee and hip  Pain.Needs to exercise  more, weight now 30 lbs more than usual. Difficulty with  Bending and squatting or turning. Some trouble with stair climbing, has stairs at home. She pops her back to improve pain some times. In the past has seen [facility]. Therapist and accupunturist. It has been 4-5 years since last chiropractic treatment. Noticed tingling in her left arm with meloxicam use. Discontinued  meloxicam 6 days ago with a sinus condition, now on flonase, cough medicine Promethazine . She is experiencing head aches, alittle bit of fluttering of heart. She notes occasional PVCs. Has a cough with the recent URTI and congestion. Back pain on a scale of  1-10 is usually off the charts when really bad. Pain is in The lower left side left flank and into the left thigh posterolateral. Notices changes in pain with weather and with high heels. Pain improves with heat, eases the pain, Soaking with epsom salt and ginger helps. Accupunture seemed to help. 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     Review of Systems  Constitutional: Negative.   HENT: Positive for rhinorrhea, sinus pain, sinus pressure and sneezing.   Eyes: Negative.   Respiratory: Positive for cough and wheezing.   Cardiovascular: Negative.   Gastrointestinal: Positive for nausea and vomiting.  Endocrine: Negative.   Genitourinary: Negative.  Musculoskeletal: Negative.   Skin: Negative.   Allergic/Immunologic: Negative.   Neurological: Negative.   Hematological: Negative.   Psychiatric/Behavioral: Negative.      Objective: Vital Signs: BP 112/71 (BP Location: Left Arm, Patient Position: Sitting)   Pulse 81   Ht 5\' 9"  (1.753 m)   Wt 211 lb (95.7 kg)   LMP 04/21/2017   BMI 31.16 kg/m   Physical Exam  Constitutional: She is oriented to person, place, and time. She appears well-developed and well-nourished.  HENT:  Head: Normocephalic and atraumatic.  Eyes: EOM are normal. Pupils are equal, round, and  reactive to light.  Neck: Normal range of motion. Neck supple.  Pulmonary/Chest: Effort normal and breath sounds normal.  Abdominal: Soft. Bowel sounds are normal.  Musculoskeletal: Normal range of motion.  Neurological: She is alert and oriented to person, place, and time.  Skin: Skin is warm and dry.  Psychiatric: She has a normal mood and affect. Her behavior is normal. Judgment and thought content normal.    Back Exam   Tenderness  The patient is experiencing tenderness in the lumbar.      Specialty Comments:  No specialty comments available.  Imaging: No results found.   PMFS History: Patient Active Problem List   Diagnosis Date Noted  . Viral URI with cough 04/25/2017  . Abscess 12/27/2016  . Left anterior shoulder pain 06/28/2015  . Skin infection 01/12/2015  . Breast pain 01/12/2015  . UTI (urinary tract infection) 11/16/2014  . Weight gain 11/16/2014  . Left medial knee pain 09/21/2014  . Thyromegaly 09/21/2014  . Obesity (BMI 30-39.9) 06/21/2014  . Edema 04/07/2014  . Maxillary sinusitis 11/07/2013  . Elevated BP 11/07/2013  . Insomnia 07/28/2013  . Epigastric pain 12/06/2012  . Hot flushes, perimenopausal 12/06/2012  . IBS (irritable bowel syndrome) 12/06/2012  . Fatigue 12/06/2012  . Family hx of colon cancer in grandparent 12/06/2012  . Screening for malignant neoplasm of the cervix 07/22/2012  . Routine general medical examination at a health care facility 07/22/2012  . Anxiety and depression 07/22/2012  . Eating disorder 07/22/2012  . GERD (gastroesophageal reflux disease) 07/22/2012  . Fibromyalgia 07/22/2012   Past Medical History:  Diagnosis Date  . Arrhythmia   . Arthritis   . Depression   . Eating disorder   . Fibromyalgia   . GERD (gastroesophageal reflux disease)   . IBS (irritable bowel syndrome)   . Migraine   . Seizures (HCC)     Family History  Problem Relation Age of Onset  . Alcohol abuse Mother   . Arthritis Mother     . Lung cancer Mother   . Hypertension Mother   . Mental illness Mother   . Alcohol abuse Father   . Heart disease Father   . Hypertension Father   . Diabetes Father   . Colon cancer Father   . Early death Sister   . Arthritis Maternal Grandmother   . Hypertension Maternal Grandmother   . Breast cancer Maternal Grandmother   . Stroke Maternal Grandfather   . Hypertension Maternal Grandfather   . Heart disease Paternal Grandmother   . Hypertension Paternal Grandmother   . Colon cancer Paternal Grandfather   . Heart disease Paternal Grandfather   . Hypertension Paternal Grandfather   . Kidney disease Paternal Grandfather   . Diabetes Paternal Grandfather     No past surgical history on file. Social History   Occupational History  .  Ws [employer]   Social History  Main Topics  . Smoking status: Never Smoker  . Smokeless tobacco: Never Used  . Alcohol use No     Comment: wine on occasion  . Drug use: No  . Sexual activity: Not on file

## 2017-05-20 ENCOUNTER — Encounter (INDEPENDENT_AMBULATORY_CARE_PROVIDER_SITE_OTHER): Payer: BC Managed Care – PPO | Admitting: Physical Medicine and Rehabilitation

## 2017-05-21 ENCOUNTER — Telehealth: Payer: Self-pay | Admitting: Family Medicine

## 2017-05-21 NOTE — Telephone Encounter (Signed)
Caller name: Relationship to patient: Self Can be reached: [phone]  Pharmacy:  Reason for call: Request order to be tested for RA

## 2017-05-22 ENCOUNTER — Other Ambulatory Visit: Payer: Self-pay | Admitting: Family Medicine

## 2017-05-22 ENCOUNTER — Other Ambulatory Visit (INDEPENDENT_AMBULATORY_CARE_PROVIDER_SITE_OTHER): Payer: BC Managed Care – PPO

## 2017-05-22 DIAGNOSIS — M2550 Pain in unspecified joint: Secondary | ICD-10-CM

## 2017-05-22 DIAGNOSIS — R52 Pain, unspecified: Secondary | ICD-10-CM

## 2017-05-22 NOTE — Telephone Encounter (Signed)
Pt requesting order for Lupus . Please advise

## 2017-05-22 NOTE — Telephone Encounter (Signed)
Order for Rheum factor put in

## 2017-05-22 NOTE — Telephone Encounter (Signed)
agree

## 2017-05-22 NOTE — Telephone Encounter (Signed)
Called left a detailed message lab requested has been ordered by PCP and she just needs to call the office to schedule a lab appt and have done.

## 2017-05-22 NOTE — Telephone Encounter (Signed)
Have added ana

## 2017-05-23 LAB — RHEUMATOID FACTOR: Rhuematoid fact SerPl-aCnc: <10 IU/mL (ref 0.0–13.9)

## 2017-05-25 ENCOUNTER — Encounter (INDEPENDENT_AMBULATORY_CARE_PROVIDER_SITE_OTHER): Payer: BC Managed Care – PPO | Admitting: Physical Medicine and Rehabilitation

## 2017-05-26 ENCOUNTER — Ambulatory Visit (INDEPENDENT_AMBULATORY_CARE_PROVIDER_SITE_OTHER): Payer: Self-pay

## 2017-05-26 ENCOUNTER — Encounter (INDEPENDENT_AMBULATORY_CARE_PROVIDER_SITE_OTHER): Payer: Self-pay | Admitting: Physical Medicine and Rehabilitation

## 2017-05-26 ENCOUNTER — Ambulatory Visit (INDEPENDENT_AMBULATORY_CARE_PROVIDER_SITE_OTHER): Payer: BC Managed Care – PPO | Admitting: Physical Medicine and Rehabilitation

## 2017-05-26 VITALS — BP 121/77 | HR 69

## 2017-05-26 DIAGNOSIS — M545 Low back pain, unspecified: Secondary | ICD-10-CM

## 2017-05-26 DIAGNOSIS — G8929 Other chronic pain: Secondary | ICD-10-CM

## 2017-05-26 DIAGNOSIS — M47816 Spondylosis without myelopathy or radiculopathy, lumbar region: Secondary | ICD-10-CM

## 2017-05-26 MED ORDER — LIDOCAINE HCL (PF) 1 % IJ SOLN: 2.0000 mL | Freq: Once | INTRAMUSCULAR | Status: DC

## 2017-05-26 MED ORDER — DEXAMETHASONE SODIUM PHOSPHATE 10 MG/ML IJ SOLN: 15.0000 mg | Freq: Once | INTRAMUSCULAR | Status: DC

## 2017-05-26 NOTE — Progress Notes (Deleted)
Pain across back and worse on left side. States it radiates into hip and down to knee. No groin pain. Worse with standing or laying long periods. Occasional numbness and tingling.

## 2017-05-26 NOTE — Patient Instructions (Signed)

## 2017-05-29 NOTE — Progress Notes (Signed)
Suzanne Singh - 51 y.o. female MRN [id number]  Date of birth: August 27, 1966  Office Visit Note: Visit Date: 05/26/2017 PCP: [name], Yvonne R, DO Referred by: [name], Yvonne R, *  Subjective: Chief Complaint  Patient presents with  . 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 She is followed by Dr. Otelia SergeantNitka for orthopedic and spine care. He recently saw her for evaluation for her axial low back pain. It is worse on the left than right. She does get a little bit more referrals the right hip than left. She has no radicular complaints or paresthesias. She's had no focal weakness. She has had conservative care for years with chiropractic care as well as acupuncture therapy. She has tried anti-inflammatories and other medications without relief or with intolerance. He requests a diagnostic left sided facet joint block or medial branch block of the L3-for L4-L5-S1 facet joints. X-ray imaging in April did show degenerative disc changes as well as facet arthropathy at the lower levels. There was no focal narrowing and anterolisthesis. She has not had any prior spinal injections.    ROS Otherwise per HPI.  Assessment & Plan: Visit Diagnoses:  1. Spondylosis without myelopathy or radiculopathy, lumbar region   2. Chronic bilateral low back pain without sciatica     Plan: Findings:  Diagnostic left medial branch blocks of the L3-4 L4-5 and L5-S1 facet joints. She was given a pain diary to complete. Depending on the results of this we would look at a right-sided medial branch block. If she responded well to both that we would follow this probably with bilateral facet joint blocks for double block comparison and then talk to her about radiofrequency ablation which we did mention briefly today. She thought she understood from Dr. Otelia SergeantNitka that she would be having radiofrequency ablation procedure done today. I did explain to her  that trying to get this approved at least on the initial pass is very difficult at times. I did explain to her the criteria needed for completing radiofrequency ablation.    Meds & Orders:  Meds ordered this encounter  Medications  . lidocaine (PF) (XYLOCAINE) 1 % injection 2 mL  . dexamethasone (DECADRON) injection 15 mg    Orders Placed This Encounter  Procedures  . Facet Injection  . XR C-ARM NO REPORT    Follow-up: Return for potenital right side.   Procedures: No procedures performed  Lumbar Diagnostic Facet Joint Nerve Block with Fluoroscopic Guidance   Patient: Suzanne Singh      Date of Birth: August 27, 1966 MRN: [id number] PCP: [name], Yvonne R, DO      Visit Date: 05/26/2017   Universal Protocol:    Date/Time: 06/22/185:16 AM  Consent Given By: the patient  Position: PRONE  Additional Comments: Vital signs were monitored before and after the procedure. Patient was prepped and draped in the usual sterile fashion. The correct patient, procedure, and site was verified.   Injection Procedure Details:  Procedure Site One Meds Administered:  Meds ordered this encounter  Medications  . lidocaine (PF) (XYLOCAINE) 1 % injection 2 mL  . dexamethasone (DECADRON) injection 15 mg     Laterality: Left  Location/Site: Left L2, L3 and L4 medial branches and left L5 dorsal rami L3-L4 L4-L5 L5-S1  Needle size: 22 G  Needle type: spinal needle  Needle Placement: Oblique pedical  Findings:  -Contrast Used: 1 mL iohexol 180  mg iodine/mL   -Comments: It was excellent flow of contrast along the articular pillars without intravascular flow.  Procedure Details: The fluoroscope beam is vertically oriented in AP and then obliqued 15 to 20 degrees to the ipsilateral side of the desired nerve to achieve the "Scotty dog" appearance.  The skin over the target area of the junction of the superior articulating process and the transverse process (sacral ala if blocking the L5  dorsal rami) was locally anesthetized with a 1 ml volume of 1% Lidocaine without Epinephrine.  The spinal needle was inserted and advanced in a trajectory view down to the target.   After contact with periosteum and negative aspirate for blood and CSF, correct placement without intravascular or epidural spread was confirmed by injecting 0.5 ml. of Isovue-250.  A spot radiograph was obtained of this image.    Next, a 0.5 ml. volume of the injectate described above was injected. The needle was then redirected to the other facet joint nerves mentioned above if needed.  Prior to the procedure, the patient was given a Pain Diary which was completed for baseline measurements.  After the procedure, the patient rated their pain every 30 minutes and will continue rating at this frequency for a total of 5 hours.  The patient has been asked to complete the Diary and return to [place] by mail, fax or hand delivered as soon as possible.   Additional Comments:  The patient tolerated the procedure well No complications occurred Dressing: Band-Aid    Post-procedure details: Patient was observed during the procedure. Post-procedure instructions were reviewed.  Patient left the clinic in stable condition.     Clinical History: LUMBAR SPINE - COMPLETE 4+ VIEW  COMPARISON:  07/05/2009  FINDINGS: There straightening of the normal lumbar lordosis. No fracture or spondylolisthesis.  Mild loss of disc height throughout the lumbar spine. There are small endplate osteophytes mostly from L1-L2 through L3-L4.  The soft tissues are unremarkable.  Loss of disc height has increased since the prior study, most evident at L4-L5.  IMPRESSION: 1. No fracture or spondylolisthesis.  No acute finding. 2. Disc degenerative changes as detailed, which have advanced when compared to the prior study.   Electronically Signed   By: Amie Portland M.D.   On: 03/12/2017 20:56  She reports that she has never  smoked. She has never used smokeless tobacco. No results for input(s): HGBA1C, LABURIC in the last 8760 hours.  Objective:  VS:  HT:    WT:   BMI:     BP:121/77  HR:69bpm  TEMP: ( )  RESP:99 % Physical Exam  Musculoskeletal:  Obese female who has difficulty rising from a seated position does have concordant low back pain with extension rotation. She has good distal strength.    Ortho Exam Imaging: No results found.  Past Medical/Family/Surgical/Social History: Medications & Allergies reviewed per EMR Patient Active Problem List   Diagnosis Date Noted  . Viral URI with cough 04/25/2017  . Abscess 12/27/2016  . Left anterior shoulder pain 06/28/2015  . Skin infection 01/12/2015  . Breast pain 01/12/2015  . UTI (urinary tract infection) 11/16/2014  . Weight gain 11/16/2014  . Left medial knee pain 09/21/2014  . Thyromegaly 09/21/2014  . Obesity (BMI 30-39.9) 06/21/2014  . Edema 04/07/2014  . Maxillary sinusitis 11/07/2013  . Elevated BP 11/07/2013  . Insomnia 07/28/2013  . Epigastric pain 12/06/2012  . Hot flushes, perimenopausal 12/06/2012  . IBS (irritable bowel syndrome) 12/06/2012  . Fatigue 12/06/2012  .  Family hx of colon cancer in grandparent 12/06/2012  . Screening for malignant neoplasm of the cervix 07/22/2012  . Routine general medical examination at a health care facility 07/22/2012  . Anxiety and depression 07/22/2012  . Eating disorder 07/22/2012  . GERD (gastroesophageal reflux disease) 07/22/2012  . Fibromyalgia 07/22/2012   Past Medical History:  Diagnosis Date  . Arrhythmia   . Arthritis   . Depression   . Eating disorder   . Fibromyalgia   . GERD (gastroesophageal reflux disease)   . IBS (irritable bowel syndrome)   . Migraine   . Seizures (HCC)    Family History  Problem Relation Age of Onset  . Alcohol abuse Mother   . Arthritis Mother   . Lung cancer Mother   . Hypertension Mother   . Mental illness Mother   . Alcohol abuse Father     . Heart disease Father   . Hypertension Father   . Diabetes Father   . Colon cancer Father   . Early death Sister   . Arthritis Maternal Grandmother   . Hypertension Maternal Grandmother   . Breast cancer Maternal Grandmother   . Stroke Maternal Grandfather   . Hypertension Maternal Grandfather   . Heart disease Paternal Grandmother   . Hypertension Paternal Grandmother   . Colon cancer Paternal Grandfather   . Heart disease Paternal Grandfather   . Hypertension Paternal Grandfather   . Kidney disease Paternal Grandfather   . Diabetes Paternal Grandfather    No past surgical history on file. Social History   Occupational History  .  Ws [employer]   Social History Main Topics  . Smoking status: Never Smoker  . Smokeless tobacco: Never Used  . Alcohol use No     Comment: wine on occasion  . Drug use: No  . Sexual activity: Not on file

## 2017-05-29 NOTE — Procedures (Signed)
Lumbar Diagnostic Facet Joint Nerve Block with Fluoroscopic Guidance   Patient: Suzanne Singh      Date of Birth: 1966-06-30 MRN: [id number] PCP: [name], Yvonne R, DO      Visit Date: 05/26/2017   Universal Protocol:    Date/Time: 06/22/185:16 AM  Consent Given By: the patient  Position: PRONE  Additional Comments: Vital signs were monitored before and after the procedure. Patient was prepped and draped in the usual sterile fashion. The correct patient, procedure, and site was verified.   Injection Procedure Details:  Procedure Site One Meds Administered:  Meds ordered this encounter  Medications  . lidocaine (PF) (XYLOCAINE) 1 % injection 2 mL  . dexamethasone (DECADRON) injection 15 mg     Laterality: Left  Location/Site: Left L2, L3 and L4 medial branches and left L5 dorsal rami L3-L4 L4-L5 L5-S1  Needle size: 22 G  Needle type: spinal needle  Needle Placement: Oblique pedical  Findings:  -Contrast Used: 1 mL iohexol 180 mg iodine/mL   -Comments: It was excellent flow of contrast along the articular pillars without intravascular flow.  Procedure Details: The fluoroscope beam is vertically oriented in AP and then obliqued 15 to 20 degrees to the ipsilateral side of the desired nerve to achieve the "Scotty dog" appearance.  The skin over the target area of the junction of the superior articulating process and the transverse process (sacral ala if blocking the L5 dorsal rami) was locally anesthetized with a 1 ml volume of 1% Lidocaine without Epinephrine.  The spinal needle was inserted and advanced in a trajectory view down to the target.   After contact with periosteum and negative aspirate for blood and CSF, correct placement without intravascular or epidural spread was confirmed by injecting 0.5 ml. of Isovue-250.  A spot radiograph was obtained of this image.    Next, a 0.5 ml. volume of the injectate described above was injected. The needle was then  redirected to the other facet joint nerves mentioned above if needed.  Prior to the procedure, the patient was given a Pain Diary which was completed for baseline measurements.  After the procedure, the patient rated their pain every 30 minutes and will continue rating at this frequency for a total of 5 hours.  The patient has been asked to complete the Diary and return to us by mail, fax or hand delivered as soon as possible.   Additional Comments:  The patient tolerated the procedure well No complications occurred Dressing: Band-Aid    Post-procedure details: Patient was observed during the procedure. Post-procedure instructions were reviewed.  Patient left the clinic in stable condition.

## 2017-06-01 ENCOUNTER — Telehealth (INDEPENDENT_AMBULATORY_CARE_PROVIDER_SITE_OTHER): Payer: Self-pay

## 2017-06-01 NOTE — Telephone Encounter (Signed)
Patient dropped off pain diary and she had no relief. Please advise on next step.

## 2017-06-02 ENCOUNTER — Ambulatory Visit: Payer: BC Managed Care – PPO | Admitting: Family Medicine

## 2017-06-02 NOTE — Telephone Encounter (Signed)
Dr. Otelia SergeantNitka had available appt on 06/08/17 @ 2:15 so went ahead and booked this. I called pt and left detailed message asking her to call back to confirm if she can make the 06/08/17 appt.

## 2017-06-02 NOTE — Telephone Encounter (Signed)
Pain diary demonstrated no effect on her pain at least above placebo, she does not need other side injected but does need follow up with Dr. Otelia SergeantNitka

## 2017-06-08 ENCOUNTER — Ambulatory Visit (INDEPENDENT_AMBULATORY_CARE_PROVIDER_SITE_OTHER): Payer: BC Managed Care – PPO | Admitting: Specialist

## 2017-06-22 ENCOUNTER — Ambulatory Visit (INDEPENDENT_AMBULATORY_CARE_PROVIDER_SITE_OTHER): Payer: BC Managed Care – PPO | Admitting: Medical

## 2017-06-22 ENCOUNTER — Encounter: Payer: Self-pay | Admitting: Medical

## 2017-06-22 VITALS — BP 109/69 | HR 73 | Temp 97.8°F | Resp 16 | Ht 69.0 in | Wt 211.6 lb

## 2017-06-22 DIAGNOSIS — G8929 Other chronic pain: Secondary | ICD-10-CM

## 2017-06-22 DIAGNOSIS — M5442 Lumbago with sciatica, left side: Secondary | ICD-10-CM | POA: Diagnosis not present

## 2017-06-22 DIAGNOSIS — R300 Dysuria: Secondary | ICD-10-CM

## 2017-06-22 DIAGNOSIS — IMO0002 Reserved for concepts with insufficient information to code with codable children: Secondary | ICD-10-CM

## 2017-06-22 DIAGNOSIS — B379 Candidiasis, unspecified: Secondary | ICD-10-CM | POA: Diagnosis not present

## 2017-06-22 DIAGNOSIS — Q600 Renal agenesis, unilateral: Secondary | ICD-10-CM

## 2017-06-22 LAB — POC URINALSYSI DIPSTICK (AUTOMATED)
Bilirubin, UA: NEGATIVE
GLUCOSE UA: NEGATIVE
KETONES UA: NEGATIVE
Leukocytes, UA: NEGATIVE
Nitrite, UA: NEGATIVE
Protein, UA: NEGATIVE
RBC UA: NEGATIVE
SPEC GRAV UA: 1.02 (ref 1.010–1.025)
UROBILINOGEN UA: NEGATIVE U/dL — AB
pH, UA: 6 (ref 5.0–8.0)

## 2017-06-22 MED ORDER — TRAMADOL HCL 50 MG PO TABS: ORAL_TABLET | ORAL | 0 refills | Status: DC

## 2017-06-22 MED ORDER — CIPROFLOXACIN HCL 500 MG PO TABS: 500.0000 mg | ORAL_TABLET | Freq: Two times a day (BID) | ORAL | 0 refills | Status: DC

## 2017-06-22 MED ORDER — FLUCONAZOLE 150 MG PO TABS: 150.0000 mg | ORAL_TABLET | Freq: Once | ORAL | 0 refills | Status: AC

## 2017-06-22 MED ORDER — CYCLOBENZAPRINE HCL 10 MG PO TABS: 10.0000 mg | ORAL_TABLET | Freq: Every day | ORAL | 0 refills | Status: DC

## 2017-06-22 NOTE — Patient Instructions (Addendum)
For your back pain that is chronic refilling your tramadol and flexeril. If pain worsens please notify us.   You reported some improvement with monistat so  I am going to write you diflucan one tablet pending urine culture studies. I could do urine ancillary studies if necessary but as we discussed those can be very expensive depending on your insurance plan, deductible and coverage.(pt expressed understanding and agreed)  Urine culture pending. Will send cipro to pharmacy but not to fill just yet pending culture. Could start if more classic uti symptoms were to occur.  cmp today to assess your  kidney function.  Follow up in 7 days or as needed

## 2017-06-22 NOTE — Progress Notes (Signed)
Subjective:    Patient ID: Suzanne Singh, female    DOB: 1966/01/14, 51 y.o.   MRN: [id number]  HPI  Pt in for has some lower back pain on both sides last 2-3 daya. Pt pain more on left side than rt side. But she also has history of back pain in the past. Pcp has her on tramadol in past for pain. It seemed to help. Old rx of tramadol is about to run out. Pt is out of flexeril. Pt only got minimal improvement with epidural injection. She pointes more to mid lumbar area and si area. But not the cva areas.  Pt does have some sensation and urge to go and some dysuria. Faint achy over her suprapubic area. Pt just got over cycle. Pt states mild rare urinary tract infection. 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  Pt did not have any itching but she states did try monostat. No discharge. But thinks maybe felt better.  Hx of one kidney in past. Pt not sure which one she has. Other kidney small and does not function well per pt.    Review of Systems  Constitutional: Negative for chills, fatigue and fever.  Respiratory: Negative for cough, chest tightness, shortness of breath and wheezing.   Cardiovascular: Negative for chest pain and palpitations.  Gastrointestinal: Negative for abdominal distention, abdominal pain, anal bleeding, blood in stool, nausea, rectal pain and vomiting.       Faint suprapubic tenderness.   Musculoskeletal: Positive for back pain. Negative for arthralgias, myalgias and neck pain.  Neurological: Negative for dizziness, speech difficulty, weakness, numbness and headaches.  Hematological: Negative for adenopathy. Does not bruise/bleed easily.  Psychiatric/Behavioral: Negative for behavioral problems and confusion.   Past Medical History:  Diagnosis Date  . Arrhythmia   . Arthritis   . Depression   . Eating disorder   . Fibromyalgia   . GERD (gastroesophageal reflux disease)   . IBS (irritable bowel syndrome)   . Migraine     . Seizures [facility])      Social History   Social History  . Marital status: Married    Spouse name: N/A  . Number of children: 2  . Years of education: N/A   Occupational History  .  Ws [employer]   Social History Main Topics  . Smoking status: Never Smoker  . Smokeless tobacco: Never Used  . Alcohol use No     Comment: wine on occasion  . Drug use: No  . Sexual activity: Not on file   Other Topics Concern  . Not on file   Social History Narrative   Middle school English teacher, [employer] schools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   2 sons    No past surgical history on file.  Family History  Problem Relation Age of Onset  . Alcohol abuse Mother   . Arthritis Mother   . Lung cancer Mother   . Hypertension Mother   . Mental illness Mother   . Alcohol abuse Father   . Heart disease Father   . Hypertension Father   . Diabetes Father   . Colon cancer Father   . Early death Sister   . Arthritis Maternal Grandmother   . Hypertension Maternal Grandmother   . Breast cancer Maternal Grandmother   . Stroke Maternal Grandfather   . Hypertension Maternal Grandfather   . Heart disease Paternal Grandmother   .  Hypertension Paternal Grandmother   . Colon cancer Paternal Grandfather   . Heart disease Paternal Grandfather   . Hypertension Paternal Grandfather   . Kidney disease Paternal Grandfather   . Diabetes Paternal Grandfather     Allergies  Allergen Reactions  . Ambien Cr [Zolpidem Tartrate Er]     Seizure   . Cymbalta [Duloxetine Hcl]     Seizure   . Celebrex [Celecoxib]     Hives   . Hydrocodone Itching  . Latex Rash    Adhesive    Current Outpatient Prescriptions on File Prior to Visit  Medication Sig Dispense Refill  . albuterol (PROVENTIL HFA;VENTOLIN HFA) 108 (90 Base) MCG/ACT inhaler Inhale 2 puffs into the lungs every 6 (six) hours as needed for wheezing or shortness of breath. 1 Inhaler 0   . amoxicillin-clavulanate (AUGMENTIN) 875-125 MG tablet Take 1 tablet by mouth 2 (two) times daily. 20 tablet 0  . clonazePAM (KLONOPIN) 0.5 MG tablet Take 1 tablet (0.5 mg total) by mouth 2 (two) times daily as needed for anxiety. 30 tablet 0  . fluticasone (FLONASE) 50 MCG/ACT nasal spray Place 2 sprays into both nostrils daily. 16 g 6  . meloxicam (MOBIC) 15 MG tablet Take 1/2 to 1 tablet every day 30 tablet 2  . mupirocin ointment (BACTROBAN) 2 % 1 application to affected area daily 15 g 0  . promethazine-dextromethorphan (PROMETHAZINE-DM) 6.25-15 MG/5ML syrup Take 5 mLs by mouth 4 (four) times daily as needed. 118 mL 0  . traMADol (ULTRAM) 50 MG tablet Take 1 tablet (50 mg total) by mouth every 6 (six) hours as needed for severe pain. 30 tablet 0   Current Facility-Administered Medications on File Prior to Visit  Medication Dose Route Frequency Provider Last Rate Last Dose  . dexamethasone (DECADRON) injection 15 mg  15 mg Other Once Tyrell Antonio, MD      . lidocaine (PF) (XYLOCAINE) 1 % injection 2 mL  2 mL Other Once Tyrell Antonio, MD        BP 109/69   Pulse 73   Temp 97.8 F (36.6 C) (Oral)   Resp 16   Ht 5\' 9"  (1.753 m)   Wt 211 lb 9.6 oz (96 kg)   SpO2 100%   BMI 31.25 kg/m       Objective:   Physical Exam  General Appearance- Not in acute distress.    Chest and Lung Exam Auscultation: Breath sounds:-Normal. Clear even and unlabored. Adventitious sounds:- No Adventitious sounds.  Cardiovascular Auscultation:Rythm - Regular, rate and rythm. Heart Sounds -Normal heart sounds.  Abdomen Inspection:-Inspection Normal.  Palpation/Perucssion: Palpation and Percussion of the abdomen reveal- faint mid suprapubic  Tenderness No Rebound tenderness, No rigidity(Guarding) and No Palpable abdominal masses.  Liver:-Normal.  Spleen:- Normal.   Back Mid lumbar spine tenderness to palpation. No cva tenderness. .  Lower ext neurologic  L5-S1 sensation intact  bilaterally. Normal patellar reflexes bilaterally. No foot drop bilaterally.     Assessment & Plan:  For your back pain that is chronic refilling your tramadol and flexeril. If pain worsens please notify us.  You reported some improvement with monistat so  I am going to write you diflucan one tablet pending urine culture studies. I could do urine ancillary studies if necessary but as we discussed those can be very expensive depending on your insurance plan, deductible and coverage. (pt expressed understanding and agreed)  Urine culture pending. Will send cipro to pharmacy but not to fill just yet pending culture. Could  start if more classic uti symptoms were to occur.  cmp today to assess your  kidney function.  Follow up in 7 days or as needed  Orpha Dain, Ramon DredgeEdward, VF CorporationPA-C

## 2017-06-23 LAB — COMPREHENSIVE METABOLIC PANEL
ALBUMIN: 3.9 g/dL (ref 3.5–5.2)
ALT: 11 U/L (ref 0–35)
AST: 21 U/L (ref 0–37)
Alkaline Phosphatase: 69 U/L (ref 39–117)
BUN: 17 mg/dL (ref 6–23)
CALCIUM: 9.1 mg/dL (ref 8.4–10.5)
CO2: 28 mEq/L (ref 19–32)
CREATININE: 0.72 mg/dL (ref 0.40–1.20)
Chloride: 102 mEq/L (ref 96–112)
GFR: 110.51 mL/min (ref >60.00)
Glucose, Bld: 77 mg/dL (ref 70–99)
POTASSIUM: 4.1 meq/L (ref 3.5–5.1)
Sodium: 135 mEq/L (ref 135–145)
Total Bilirubin: 0.3 mg/dL (ref 0.2–1.2)
Total Protein: 6.8 g/dL (ref 6.0–8.3)

## 2017-06-23 LAB — URINE CULTURE

## 2017-07-17 ENCOUNTER — Ambulatory Visit (INDEPENDENT_AMBULATORY_CARE_PROVIDER_SITE_OTHER): Payer: BC Managed Care – PPO | Admitting: Specialist

## 2017-07-31 ENCOUNTER — Telehealth: Payer: Self-pay | Admitting: *Deleted

## 2017-07-31 NOTE — Telephone Encounter (Signed)
Received request for Medical Records from [facility], forwarded to [place] for email/scan/SLS 08/24

## 2017-08-05 ENCOUNTER — Telehealth (INDEPENDENT_AMBULATORY_CARE_PROVIDER_SITE_OTHER): Payer: Self-pay | Admitting: Radiology

## 2017-08-05 NOTE — Telephone Encounter (Signed)
Patient called requesting appointment with Dr. Otelia SergeantNitka for follow up. I offered her an earlier appointment with Fayrene FearingJames, but she states that she really needs Dr. Barbaraann FasterNitka's input on the situation. The injection with Dr. Alvester MorinNewton did not provide any relief. Since that time, she has seen another specialist who believes that a breast reduction may help. She would like to know if Dr. Otelia SergeantNitka feels that it would be helpful as well. If so, she would need his notes to provide to the other physician, and a note stating he thinks it would help her back pain, so that she could get insurance approval. If Dr. Otelia SergeantNitka agrees, she doesn't necessarily need an appointment, just the documentation.  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 She also requests to be worked in to his schedule if she needs a follow up appt since he does not have availability for over a month at the moment. Sept 6 would be a good day for her if you have a cancellation as she has other appts that day.  Please let me know what you think.

## 2017-08-05 NOTE — Telephone Encounter (Signed)
Patient called requesting appointment with Dr. Otelia SergeantNitka for follow up. I offered her an earlier appointment with Fayrene FearingJames, but she states that she really needs Dr. Barbaraann FasterNitka's input on the situation. The injection with Dr. Alvester MorinNewton did not provide any relief. Since that time, she has seen another specialist who believes that a breast reduction may help. She would like to know if Dr. Otelia SergeantNitka feels that it would be helpful as well. If so, she would need his notes to provide to the other physician, and a note stating he thinks it would help her back pain, so that she could get insurance approval. If Dr. Otelia SergeantNitka agrees, she doesn't necessarily need an appointment, just the documentation.  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 She also requests to be worked in to his schedule if she needs a follow up appt since he does not have availability for over a month at the moment. Sept 6 would be a good day for her if you have a cancellation as she has other appts that day.  Please call to advise. [phone]

## 2017-08-10 ENCOUNTER — Other Ambulatory Visit: Payer: Self-pay | Admitting: Medical

## 2017-08-12 ENCOUNTER — Ambulatory Visit (INDEPENDENT_AMBULATORY_CARE_PROVIDER_SITE_OTHER): Payer: BC Managed Care – PPO | Admitting: Specialist

## 2017-08-14 ENCOUNTER — Telehealth: Payer: Self-pay | Admitting: Family Medicine

## 2017-08-14 NOTE — Telephone Encounter (Signed)
Dr. Laury AxonLowne please advise-  I received a paper refill request for Flexeril.  Last filled 06/22/17 by Saguier qty:15 RF:0 Last ov: 06/22/17   Also refill request for Tramadol 50mg  Last filled: 06/22/17 bu Saguier qty:21 rf:0 Last ov: 06/22/17   [facility] [place] [place] [address]

## 2017-08-14 NOTE — Telephone Encounter (Signed)
Refill both x1 

## 2017-08-17 MED ORDER — TRAMADOL HCL 50 MG PO TABS: 50.0000 mg | ORAL_TABLET | Freq: Four times a day (QID) | ORAL | 0 refills | Status: DC | PRN

## 2017-08-17 MED ORDER — CYCLOBENZAPRINE HCL 10 MG PO TABS: 10.0000 mg | ORAL_TABLET | Freq: Every day | ORAL | 0 refills | Status: DC

## 2017-08-17 NOTE — Telephone Encounter (Signed)
Refill #30

## 2017-08-17 NOTE — Telephone Encounter (Signed)
Rx faxed to pharmacy  

## 2017-08-17 NOTE — Telephone Encounter (Signed)
Dr. Laury AxonLowne  I just want to clarify for this pt's tramadol. Looks like when you prescribed Tramadol you dispensed a qty of 30 but pt's most recent rx that was prescribed by Ramon Dredgedward was for a qty of 21. How much would you like to prescribe.

## 2017-08-17 NOTE — Telephone Encounter (Signed)
Rx printed and signed.  

## 2017-09-16 ENCOUNTER — Ambulatory Visit (INDEPENDENT_AMBULATORY_CARE_PROVIDER_SITE_OTHER): Payer: BC Managed Care – PPO | Admitting: Medical

## 2017-09-16 VITALS — HR 79 | Ht 69.5 in | Wt 211.8 lb

## 2017-09-16 DIAGNOSIS — J0120 Acute ethmoidal sinusitis, unspecified: Secondary | ICD-10-CM | POA: Diagnosis not present

## 2017-09-16 DIAGNOSIS — H6690 Otitis media, unspecified, unspecified ear: Secondary | ICD-10-CM

## 2017-09-16 DIAGNOSIS — J040 Acute laryngitis: Secondary | ICD-10-CM

## 2017-09-16 DIAGNOSIS — J029 Acute pharyngitis, unspecified: Secondary | ICD-10-CM | POA: Diagnosis not present

## 2017-09-16 LAB — POCT INFLUENZA A/B
INFLUENZA A, POC: NEGATIVE
INFLUENZA B, POC: NEGATIVE

## 2017-09-16 LAB — POCT RAPID STREP A (OFFICE): RAPID STREP A SCREEN: NEGATIVE

## 2017-09-16 MED ORDER — AZITHROMYCIN 250 MG PO TABS
ORAL_TABLET | ORAL | 0 refills | Status: DC
Start: 2017-09-16 — End: 2017-09-16

## 2017-09-16 MED ORDER — ALBUTEROL SULFATE HFA 108 (90 BASE) MCG/ACT IN AERS: 2.0000 | INHALATION_SPRAY | Freq: Four times a day (QID) | RESPIRATORY_TRACT | 0 refills | Status: DC | PRN

## 2017-09-16 MED ORDER — FLUTICASONE PROPIONATE 50 MCG/ACT NA SUSP: 2.0000 | Freq: Every day | NASAL | 1 refills | Status: DC

## 2017-09-16 MED ORDER — AZITHROMYCIN 250 MG PO TABS
ORAL_TABLET | ORAL | 0 refills | Status: DC
Start: 2017-09-16 — End: 2017-12-22

## 2017-09-16 MED ORDER — BENZONATATE 200 MG PO CAPS: 200.0000 mg | ORAL_CAPSULE | Freq: Three times a day (TID) | ORAL | 0 refills | Status: DC | PRN

## 2017-09-16 NOTE — Patient Instructions (Signed)
For your recent sinus pressure/likely infection and left ear pain/infection, I am prescribing azithromycin. If by this coming Friday you have more pain/worse symptoms then please call our office and arrange nurse visit for Rocephin 1 g injection. We talked about this today and decided could wait a couple of days and see how you do.  Your throat looks mildly red and your rapid strep test was negative. We are also sending out throat culture.  Your flu test was negative.  For your horse voice/laryngitis, I want you to rest her voice and I will write you work note to return on Monday.  Albuterol prescription today to use in the event you have wheezing.  Follow-up in 7-10 days or as needed.

## 2017-09-16 NOTE — Progress Notes (Signed)
Subjective:    Patient ID: Suzanne Singh, female    DOB: Apr 18, 1966, 51 y.o.   MRN: [id number]  HPI  Pt in with feeling sick for 2 weeks. Pt has some sinus pressure, ear pain, sore throat and very hoarse voice today. Pt has some dry cough. No wheezing. She does note that got progressively worse over the last week. But last 24 hours have been worse with a change of her voice just today. She mentions that she got sick spite being on Augmentin for the last 7 days. Apparently she had dental procedure and they gave her antibiotics. Currently not using Augmentin. I think she ran out of Augmentin needed day before yesterday or yesterday.  Pt at times needs inhalers when gets sick. But recently has not been wheezing.  Pt is a Runner, broadcasting/film/video. She wants to make sure does not have flu.      Review of Systems  Constitutional: Positive for fatigue. Negative for chills and fever.  HENT: Positive for congestion, ear pain, sinus pain, sinus pressure, sore throat and voice change. Negative for trouble swallowing.   Respiratory: Negative for chest tightness, shortness of breath and wheezing.   Cardiovascular: Negative for chest pain and palpitations.  Gastrointestinal: Negative for abdominal pain, blood in stool, nausea and vomiting.  Genitourinary: Negative for difficulty urinating, dysuria, flank pain, menstrual problem and vaginal pain.  Musculoskeletal: Negative for arthralgias, back pain, myalgias and neck pain.  Skin: Negative for rash.  Neurological: Negative for dizziness, speech difficulty, weakness and headaches.  Hematological: Negative for adenopathy. Does not bruise/bleed easily.  Psychiatric/Behavioral: Negative for behavioral problems, decreased concentration and dysphoric mood.   Past Medical History:  Diagnosis Date  . Arrhythmia   . Arthritis   . Depression   . Eating disorder   . Fibromyalgia   . GERD (gastroesophageal reflux disease)   . IBS (irritable bowel syndrome)   . Migraine    . Seizures [facility])      Social History   Social History  . Marital status: Married    Spouse name: N/A  . Number of children: 2  . Years of education: N/A   Occupational History  .  Ws [employer]   Social History Main Topics  . Smoking status: Never Smoker  . Smokeless tobacco: Never Used  . Alcohol use No     Comment: wine on occasion  . Drug use: No  . Sexual activity: Not on file   Other Topics Concern  . 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   2 sons    No past surgical history on file.  Family History  Problem Relation Age of Onset  . Alcohol abuse Mother   . Arthritis Mother   . Lung cancer Mother   . Hypertension Mother   . Mental illness Mother   . Alcohol abuse Father   . Heart disease Father   . Hypertension Father   . Diabetes Father   . Colon cancer Father   . Early death Sister   . Arthritis Maternal Grandmother   . Hypertension Maternal Grandmother   . Breast cancer Maternal Grandmother   . Stroke Maternal Grandfather   . Hypertension Maternal Grandfather   . Heart disease Paternal Grandmother   . Hypertension Paternal Grandmother   . Colon cancer Paternal Grandfather   . Heart disease Paternal Grandfather   . Hypertension  Paternal Grandfather   . Kidney disease Paternal Grandfather   . Diabetes Paternal Grandfather     Allergies  Allergen Reactions  . Ambien Cr [Zolpidem Tartrate Er]     Seizure   . Cymbalta [Duloxetine Hcl]     Seizure   . Celebrex [Celecoxib]     Hives   . Hydrocodone Itching  . Latex Rash    Adhesive    Current Outpatient Prescriptions on File Prior to Visit  Medication Sig Dispense Refill  . clonazePAM (KLONOPIN) 0.5 MG tablet Take 1 tablet (0.5 mg total) by mouth 2 (two) times daily as needed for anxiety. 30 tablet 0  . cyclobenzaprine (FLEXERIL) 10 MG tablet Take 1  tablet (10 mg total) by mouth at bedtime. 15 tablet 0  . fluticasone (FLONASE) 50 MCG/ACT nasal spray Place 2 sprays into both nostrils daily. 16 g 6  . meloxicam (MOBIC) 15 MG tablet Take 1/2 to 1 tablet every day 30 tablet 2  . traMADol (ULTRAM) 50 MG tablet 1-2 tab po every 6  Hours as needed for severe pain 21 tablet 0  . mupirocin ointment (BACTROBAN) 2 % 1 application to affected area daily (Patient not taking: Reported on 09/16/2017) 15 g 0  . promethazine-dextromethorphan (PROMETHAZINE-DM) 6.25-15 MG/5ML syrup Take 5 mLs by mouth 4 (four) times daily as needed. (Patient not taking: Reported on 09/16/2017) 118 mL 0   Current Facility-Administered Medications on File Prior to Visit  Medication Dose Route Frequency Provider Last Rate Last Dose  . lidocaine (PF) (XYLOCAINE) 1 % injection 2 mL  2 mL Other Once Tyrell Antonio, MD        Pulse 79   Ht 5' 9.5" (1.765 m)   Wt 211 lb 12.8 oz (96.1 kg)   LMP 08/10/2017 (Approximate)   SpO2 100%   BMI 30.83 kg/m       Objective:   Physical Exam   General  Mental Status - Alert. General Appearance - Well groomed. Not in acute distress.  Skin Rashes- No Rashes.  HEENT Head- Normal. Ear Auditory Canal - Left- Normal. Right - Normal.Tympanic Membrane- Left- Mild red. Right- moderate central redness. Eye Sclera/Conjunctiva- Left- Normal. Right- Normal. Nose & Sinuses Nasal Mucosa- Left-  Boggy and Congested. Right-  Boggy and  Congested.Bilateral Faint maxillary, no frontal sinus pressure. Also ethmoid sinus pain/pressure Mouth & Throat Lips: Upper Lip- Normal: no dryness, cracking, pallor, cyanosis, or vesicular eruption. Lower Lip-Normal: no dryness, cracking, pallor, cyanosis or vesicular eruption. Buccal Mucosa- Bilateral- No Aphthous ulcers. Oropharynx- No Discharge or Erythema. Tonsils: Characteristics- Bilateral- mild Erythema. Size/Enlargement- Bilateral- No enlargement. Discharge- bilateral-None.  Neck Neck- Supple. No  Masses.   Chest and Lung Exam Auscultation: Breath Sounds:-Clear even and unlabored.  Cardiovascular Auscultation:Rythm- Regular, rate and rhythm. Murmurs & Other Heart Sounds:Ausculatation of the heart reveal- No Murmurs.  Lymphatic Head & Neck General Head & Neck Lymphatics: Bilateral: Description- No Localized lymphadenopathy.      Assessment & Plan:  For your recent sinus pressure/likely infection and left ear pain/infection, I am prescribing azithromycin. If by this coming Friday you have more pain/worse symptoms then please call our office and arrange nurse visit for Rocephin 1 g injection. We talked about this today and decided could wait a couple of days and see how you do.  Your throat looks mildly red and your rapid strep test was negative. We are also sending out throat culture.  Your flu test was negative.  For your horse voice/laryngitis, I want you to  rest her voice and I will write you work note to return on Monday.  Albuterol prescription today to use in the event you have wheezing.  For nasal congestion also did prescribe Flonase. For cough prescription of benzonatate.  Follow-up in 7-10 days or as needed.  Bohdi Leeds, Ramon Dredge, PA-C

## 2017-09-17 LAB — CULTURE, GROUP A STREP
MICRO NUMBER: 81129283
SPECIMEN QUALITY: ADEQUATE

## 2017-09-21 ENCOUNTER — Telehealth: Payer: Self-pay | Admitting: Medical

## 2017-09-21 ENCOUNTER — Telehealth: Payer: Self-pay

## 2017-09-21 MED ORDER — ALBUTEROL SULFATE HFA 108 (90 BASE) MCG/ACT IN AERS: 2.0000 | INHALATION_SPRAY | Freq: Four times a day (QID) | RESPIRATORY_TRACT | 3 refills | Status: AC | PRN

## 2017-09-21 NOTE — Telephone Encounter (Signed)
Pt insurance does not cover Proventil HFA ,Preferred alternative is  LEVALBUTEROL AER ACT,PRO AIR HFAAER,PRO AIR RESPIAER. Please Advise.

## 2017-09-21 NOTE — Telephone Encounter (Signed)
proair sent since I was advised better covered

## 2017-10-19 ENCOUNTER — Other Ambulatory Visit: Payer: Self-pay | Admitting: Family Medicine

## 2017-10-27 ENCOUNTER — Encounter: Payer: BC Managed Care – PPO | Admitting: Family Medicine

## 2017-11-23 ENCOUNTER — Other Ambulatory Visit: Payer: Self-pay | Admitting: Family Medicine

## 2017-12-22 ENCOUNTER — Encounter: Payer: Self-pay | Admitting: Family Medicine

## 2017-12-22 ENCOUNTER — Ambulatory Visit (INDEPENDENT_AMBULATORY_CARE_PROVIDER_SITE_OTHER): Payer: BC Managed Care – PPO | Admitting: Family Medicine

## 2017-12-22 VITALS — BP 120/88 | HR 87 | Temp 98.4°F | Resp 16 | Ht 70.0 in | Wt 209.4 lb

## 2017-12-22 DIAGNOSIS — F419 Anxiety disorder, unspecified: Secondary | ICD-10-CM | POA: Diagnosis not present

## 2017-12-22 DIAGNOSIS — M5442 Lumbago with sciatica, left side: Secondary | ICD-10-CM

## 2017-12-22 DIAGNOSIS — Z23 Encounter for immunization: Secondary | ICD-10-CM

## 2017-12-22 DIAGNOSIS — M25561 Pain in right knee: Secondary | ICD-10-CM

## 2017-12-22 DIAGNOSIS — M25562 Pain in left knee: Secondary | ICD-10-CM

## 2017-12-22 DIAGNOSIS — G4700 Insomnia, unspecified: Secondary | ICD-10-CM | POA: Diagnosis not present

## 2017-12-22 DIAGNOSIS — G8929 Other chronic pain: Secondary | ICD-10-CM

## 2017-12-22 DIAGNOSIS — M25552 Pain in left hip: Secondary | ICD-10-CM

## 2017-12-22 DIAGNOSIS — Z0000 Encounter for general adult medical examination without abnormal findings: Secondary | ICD-10-CM | POA: Diagnosis not present

## 2017-12-22 MED ORDER — TRAMADOL HCL 50 MG PO TABS: ORAL_TABLET | ORAL | 0 refills | Status: DC

## 2017-12-22 MED ORDER — CLONAZEPAM 0.5 MG PO TABS: 0.5000 mg | ORAL_TABLET | Freq: Two times a day (BID) | ORAL | 1 refills | Status: DC | PRN

## 2017-12-22 MED ORDER — SUVOREXANT 10 MG PO TABS: 1.0000 | ORAL_TABLET | Freq: Every evening | ORAL | 0 refills | Status: DC | PRN

## 2017-12-22 NOTE — Progress Notes (Signed)
Subjective:     Suzanne Singh is a 52 y.o. female and is here for a comprehensive physical exam. The patient reports problems - needs new ortho referral--- she never got called back from Dr Rogelia Mire office. She was told she had bulging discs and ddd.   She has also had injections in her knee in past She wants a new ortho because she has trouble communicating with [facility] Social History   Socioeconomic History  . Marital status: Married    Spouse name: Not on file  . Number of children: 2  . Years of education: Not on file  . Highest education level: Not on file  Social Needs  . Financial resource strain: Not on file  . Food insecurity - worry: Not on file  . Food insecurity - inability: Not on file  . Transportation needs - medical: Not on file  . Transportation needs - non-medical: Not on file  Occupational History  . Not on file  Tobacco Use  . Smoking status: Never Smoker  . Smokeless tobacco: Never Used  Substance and Sexual Activity  . Alcohol use: No    Alcohol/week: 0.0 oz    Comment: wine on occasion  . Drug use: No  . Sexual activity: Not on file  Other Topics Concern  . 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   2 sons      Exercise-no   Health Maintenance  Topic Date Due  . MAMMOGRAM  07/25/2016  . TETANUS/TDAP  12/08/2016  . HIV Screening  12/23/2023 (Originally 01/[birth year]/1983)  . PAP SMEAR  07/10/2018  . INFLUENZA VACCINE  Completed    The following portions of the patient's history were reviewed and updated as appropriate:  She  has a past medical history of Arrhythmia, Arthritis, Depression, Eating disorder, Fibromyalgia, GERD (gastroesophageal reflux disease), IBS (irritable bowel syndrome), Migraine, and Seizures (HCC). She does not have any pertinent problems on file. She  has no past surgical history on file. Her family  history includes Alcohol abuse in her father and mother; Arthritis in her maternal grandmother and mother; Breast cancer in her maternal grandmother; Colon cancer in her father and paternal grandfather; Diabetes in her father and paternal grandfather; Early death in her sister; Heart disease in her father, paternal grandfather, and paternal grandmother; Hypertension in her father, maternal grandfather, maternal grandmother, mother, paternal grandfather, and paternal grandmother; Kidney disease in her paternal grandfather; Lung cancer in her mother; Mental illness in her mother; Stroke in her maternal grandfather. She  reports that  has never smoked. she has never used smokeless tobacco. She reports that she does not drink alcohol or use drugs. She has a current medication list which includes the following prescription(s): albuterol, clonazepam, cyclobenzaprine, fluticasone, fluticasone, meloxicam, suvorexant, and tramadol, and the following Facility-Administered Medications: lidocaine (pf). Current Outpatient Medications on File Prior to Visit  Medication Sig Dispense Refill  . albuterol (PROAIR HFA) 108 (90 Base) MCG/ACT inhaler Inhale 2 puffs into the lungs every 6 (six) hours as needed for wheezing or shortness of breath. 18 g 3  . cyclobenzaprine (FLEXERIL) 10 MG tablet Take 1 tablet (10 mg total) by mouth at bedtime. 15 tablet 0  . fluticasone (FLONASE) 50 MCG/ACT nasal spray Place 2 sprays into both nostrils daily. 16 g 6  . fluticasone (FLONASE) 50 MCG/ACT nasal spray Place 2 sprays into both nostrils daily. 16 g  1  . meloxicam (MOBIC) 15 MG tablet Take 0.5-1 tablets (7.5-15 mg total) by mouth daily. 30 tablet 1   Current Facility-Administered Medications on File Prior to Visit  Medication Dose Route Frequency Provider Last Rate Last Dose  . lidocaine (PF) (XYLOCAINE) 1 % injection 2 mL  2 mL Other Once Tyrell AntonioNewton, Frederic, MD       She is allergic to [place] cr [zolpidem tartrate er]; cymbalta  [duloxetine hcl]; celebrex [celecoxib]; hydrocodone; and latex..  Review of Systems Review of Systems  Constitutional: Negative for activity change, appetite change and fatigue.  HENT: Negative for hearing loss, congestion, tinnitus and ear discharge.  dentist [id number] Eyes: Negative for visual disturbance (see optho q1y -- vision corrected to 20/20 with glasses).  Respiratory: Negative for cough, chest tightness and shortness of breath.   Cardiovascular: Negative for chest pain, palpitations and leg swelling.  Gastrointestinal: Negative for abdominal pain, diarrhea, constipation and abdominal distention.  Genitourinary: Negative for urgency, frequency, decreased urine volume and difficulty urinating.  Musculoskeletal: + back pain   B/l knee pain and L hip pain  Skin: Negative for color change, pallor and rash.  Neurological: Negative for dizziness, light-headedness, numbness and headaches.  Hematological: Negative for adenopathy. Does not bruise/bleed easily.  Psychiatric/Behavioral: Negative for suicidal ideas, confusion, sleep disturbance, self-injury, dysphoric mood, decreased concentration and agitation.      Objective:    BP 120/88 (BP Location: Left Arm, Cuff Size: Normal)   Pulse 87   Temp 98.4 F (36.9 C) (Oral)   Resp 16   Ht 5\' 10"  (1.778 m)   Wt 209 lb 6.4 oz (95 kg)   LMP 11/13/2017   SpO2 98%   BMI 30.05 kg/m  General appearance: alert, cooperative, appears stated age and no distress Head: Normocephalic, without obvious abnormality, atraumatic Eyes: conjunctivae/corneas clear. PERRL, EOM's intact. Fundi benign. Ears: normal TM's and external ear canals both ears Nose: Nares normal. Septum midline. Mucosa normal. No drainage or sinus tenderness. Throat: lips, mucosa, and tongue normal; teeth and gums normal Neck: no adenopathy, no carotid bruit, no JVD, supple, symmetrical, trachea midline and thyroid not enlarged, symmetric, no tenderness/mass/nodules Back: symmetric,  no curvature. ROM normal. No CVA tenderness. Lungs: clear to auscultation bilaterally  Breasts:--gyn Heart: s1, s2  No mumur Abdomen: soft, non-tender; bowel sounds normal; no masses,  no organomegaly Pelvic: deferred--gyn Extremities: extremities normal, atraumatic, no cyanosis or edema   + knee pain and L hip pain,  Low back pain with movement,  dtr =and b;l  Pulses: 2+ and symmetric Skin: Skin color, texture, turgor normal. No rashes or lesions Lymph nodes: Cervical, supraclavicular, and axillary nodes normal. Neurologic: Alert and oriented X 3, normal strength and tone. Normal symmetric reflexes. Normal coordination and gait    Assessment:    Healthy female exam.      Plan:    ghm utd Check labs  See After Visit Summary for Counseling Recommendations    1. Preventative health care See above  - POCT Urinalysis Dipstick (Automated) - CBC with Differential/Platelet - Comprehensive metabolic panel - Lipid panel - TSH  2. Chronic left-sided low back pain with left-sided sciatica  - traMADol (ULTRAM) 50 MG tablet; 1-2 tab po every 6  Hours as needed for severe pain  Dispense: 21 tablet; Refill: 0 - Ambulatory referral to Orthopedic Surgery  3. Anxiety stable - clonazePAM (KLONOPIN) 0.5 MG tablet; Take 1 tablet (0.5 mg total) by mouth 2 (two) times daily as needed for anxiety.  Dispense: 30  tablet; Refill: 1  4. Insomnia, unspecified type Try belsomra --- 10 mg qhs --- can increase to 20 mg if not effective  - Suvorexant (BELSOMRA) 10 MG TABS; Take 1 tablet by mouth at bedtime as needed.  Dispense: 10 tablet; Refill: 0  5. Chronic pain of both knees  - Ambulatory referral to Orthopedic Surgery  6. Pain of left hip joint  - Ambulatory referral to Orthopedic Surgery  7. Need for Tdap vaccination  - Tdap vaccine greater than or equal to 7yo IM

## 2017-12-22 NOTE — Patient Instructions (Signed)
Preventive Care 40-64 Years, Female Preventive care refers to lifestyle choices and visits with your health care provider that can promote health and wellness. What does preventive care include?  A yearly physical exam. This is also called an annual well check.  Dental exams once or twice a year.  Routine eye exams. Ask your health care provider how often you should have your eyes checked.  Personal lifestyle choices, including: ? Daily care of your teeth and gums. ? Regular physical activity. ? Eating a healthy diet. ? Avoiding tobacco and drug use. ? Limiting alcohol use. ? Practicing safe sex. ? Taking low-dose aspirin daily starting at age 58. ? Taking vitamin and mineral supplements as recommended by your health care provider. What happens during an annual well check? The services and screenings done by your health care provider during your annual well check will depend on your age, overall health, lifestyle risk factors, and family history of disease. Counseling Your health care provider may ask you questions about your:  Alcohol use.  Tobacco use.  Drug use.  Emotional well-being.  Home and relationship well-being.  Sexual activity.  Eating habits.  Work and work Statistician.  Method of birth control.  Menstrual cycle.  Pregnancy history.  Screening You may have the following tests or measurements:  Height, weight, and BMI.  Blood pressure.  Lipid and cholesterol levels. These may be checked every 5 years, or more frequently if you are over 81 years old.  Skin check.  Lung cancer screening. You may have this screening every year starting at age 78 if you have a 30-pack-year history of smoking and currently smoke or have quit within the past 15 years.  Fecal occult blood test (FOBT) of the stool. You may have this test every year starting at age 65.  Flexible sigmoidoscopy or colonoscopy. You may have a sigmoidoscopy every 5 years or a colonoscopy  every 10 years starting at age 30.  Hepatitis C blood test.  Hepatitis B blood test.  Sexually transmitted disease (STD) testing.  Diabetes screening. This is done by checking your blood sugar (glucose) after you have not eaten for a while (fasting). You may have this done every 1-3 years.  Mammogram. This may be done every 1-2 years. Talk to your health care provider about when you should start having regular mammograms. This may depend on whether you have a family history of breast cancer.  BRCA-related cancer screening. This may be done if you have a family history of breast, ovarian, tubal, or peritoneal cancers.  Pelvic exam and Pap test. This may be done every 3 years starting at age 80. Starting at age 36, this may be done every 5 years if you have a Pap test in combination with an HPV test.  Bone density scan. This is done to screen for osteoporosis. You may have this scan if you are at high risk for osteoporosis.  Discuss your test results, treatment options, and if necessary, the need for more tests with your health care provider. Vaccines Your health care provider may recommend certain vaccines, such as:  Influenza vaccine. This is recommended every year.  Tetanus, diphtheria, and acellular pertussis (Tdap, Td) vaccine. You may need a Td booster every 10 years.  Varicella vaccine. You may need this if you have not been vaccinated.  Zoster vaccine. You may need this after age 5.  Measles, mumps, and rubella (MMR) vaccine. You may need at least one dose of MMR if you were born in  1957 or later. You may also need a second dose.  Pneumococcal 13-valent conjugate (PCV13) vaccine. You may need this if you have certain conditions and were not previously vaccinated.  Pneumococcal polysaccharide (PPSV23) vaccine. You may need one or two doses if you smoke cigarettes or if you have certain conditions.  Meningococcal vaccine. You may need this if you have certain  conditions.  Hepatitis A vaccine. You may need this if you have certain conditions or if you travel or work in places where you may be exposed to hepatitis A.  Hepatitis B vaccine. You may need this if you have certain conditions or if you travel or work in places where you may be exposed to hepatitis B.  Haemophilus influenzae type b (Hib) vaccine. You may need this if you have certain conditions.  Talk to your health care provider about which screenings and vaccines you need and how often you need them. This information is not intended to replace advice given to you by your health care provider. Make sure you discuss any questions you have with your health care provider. Document Released: 12/21/2015 Document Revised: 08/13/2016 Document Reviewed: 09/25/2015 Elsevier Interactive Patient Education  2018 Elsevier Inc.  

## 2017-12-23 LAB — COMPREHENSIVE METABOLIC PANEL
ALBUMIN: 4.2 g/dL (ref 3.5–5.2)
ALT: 10 U/L (ref 0–35)
AST: 17 U/L (ref 0–37)
Alkaline Phosphatase: 74 U/L (ref 39–117)
BUN: 22 mg/dL (ref 6–23)
CALCIUM: 9.3 mg/dL (ref 8.4–10.5)
CO2: 28 meq/L (ref 19–32)
CREATININE: 0.69 mg/dL (ref 0.40–1.20)
Chloride: 101 mEq/L (ref 96–112)
GFR: 115.83 mL/min (ref >60.00)
Glucose, Bld: 84 mg/dL (ref 70–99)
Potassium: 3.9 mEq/L (ref 3.5–5.1)
Sodium: 136 mEq/L (ref 135–145)
TOTAL PROTEIN: 7.4 g/dL (ref 6.0–8.3)
Total Bilirubin: 0.4 mg/dL (ref 0.2–1.2)

## 2017-12-23 LAB — CBC WITH DIFFERENTIAL/PLATELET
BASOS ABS: 0.1 10*3/uL (ref 0.0–0.1)
BASOS PCT: 1 % (ref 0.0–3.0)
Eosinophils Absolute: 0.1 10*3/uL (ref 0.0–0.7)
Eosinophils Relative: 1.5 % (ref 0.0–5.0)
HEMATOCRIT: 37.9 % (ref 36.0–46.0)
Hemoglobin: 12.2 g/dL (ref 12.0–15.0)
LYMPHS ABS: 2.1 10*3/uL (ref 0.7–4.0)
LYMPHS PCT: 38.1 % (ref 12.0–46.0)
MCHC: 32.2 g/dL (ref 30.0–36.0)
MCV: 91.7 fl (ref 78.0–100.0)
MONOS PCT: 10.6 % (ref 3.0–12.0)
Monocytes Absolute: 0.6 10*3/uL (ref 0.1–1.0)
NEUTROS ABS: 2.7 10*3/uL (ref 1.4–7.7)
NEUTROS PCT: 48.8 % (ref 43.0–77.0)
PLATELETS: 305 10*3/uL (ref 150.0–400.0)
RBC: 4.13 Mil/uL (ref 3.87–5.11)
RDW: 13.5 % (ref 11.5–15.5)
WBC: 5.4 10*3/uL (ref 4.0–10.5)

## 2017-12-23 LAB — TSH: TSH: 2.16 u[IU]/mL (ref 0.35–4.50)

## 2017-12-23 LAB — LIPID PANEL
CHOL/HDL RATIO: 3
CHOLESTEROL: 175 mg/dL (ref 0–200)
HDL: 55.9 mg/dL (ref >39.00)
LDL Cholesterol: 107 mg/dL — ABNORMAL HIGH (ref 0–99)
NonHDL: 118.79
TRIGLYCERIDES: 57 mg/dL (ref 0.0–149.0)
VLDL: 11.4 mg/dL (ref 0.0–40.0)

## 2017-12-28 ENCOUNTER — Telehealth: Payer: Self-pay | Admitting: *Deleted

## 2017-12-28 ENCOUNTER — Telehealth: Payer: Self-pay

## 2017-12-28 NOTE — Telephone Encounter (Signed)
PA approved effective 12/28/2017 through 12/28/2020.

## 2017-12-28 NOTE — Telephone Encounter (Signed)
PA initiated via Covermymeds; KEY: [id number]. Awaiting determination.

## 2017-12-28 NOTE — Telephone Encounter (Signed)
Copied from CRM [phone]. Topic: Inquiry >> Dec 28, 2017 11:51 AM Windy KalataMichael, Taylor L, NT wrote: Reason for CRM: patient is calling to get her lab results. Please advise.  >> Dec 28, 2017 12:47 PM Morphies, Hermine MessickCarson J wrote: Not our patient

## 2017-12-29 ENCOUNTER — Other Ambulatory Visit: Payer: Self-pay | Admitting: *Deleted

## 2017-12-29 DIAGNOSIS — E7800 Pure hypercholesterolemia, unspecified: Secondary | ICD-10-CM

## 2017-12-29 NOTE — Telephone Encounter (Signed)
Patient notified of results on 12/22/17

## 2018-01-30 ENCOUNTER — Ambulatory Visit: Payer: BC Managed Care – PPO | Admitting: Internal Medicine

## 2018-01-30 ENCOUNTER — Encounter: Payer: Self-pay | Admitting: Internal Medicine

## 2018-01-30 VITALS — BP 112/72 | HR 80 | Temp 99.0°F | Resp 16 | Wt 209.0 lb

## 2018-01-30 DIAGNOSIS — R509 Fever, unspecified: Secondary | ICD-10-CM | POA: Diagnosis not present

## 2018-01-30 DIAGNOSIS — R112 Nausea with vomiting, unspecified: Secondary | ICD-10-CM

## 2018-01-30 LAB — POC INFLUENZA A&B (BINAX/QUICKVUE)
INFLUENZA A, POC: NEGATIVE
Influenza B, POC: NEGATIVE

## 2018-01-30 MED ORDER — ONDANSETRON HCL 4 MG PO TABS: 4.0000 mg | ORAL_TABLET | Freq: Three times a day (TID) | ORAL | 0 refills | Status: DC | PRN

## 2018-01-30 NOTE — Progress Notes (Signed)
Subjective:    Patient ID: Suzanne Singh, female    DOB: 10-25-1966, 52 y.o.   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  She presents with a 2-day history of nausea vomiting and diarrhea.  She has some low-grade fever and generalized myalgias.  She has had some mild sore throat that she feels is related to the vomiting.  Past Medical History:  Diagnosis Date  . Arrhythmia   . Arthritis   . Depression   . Eating disorder   . Fibromyalgia   . GERD (gastroesophageal reflux disease)   . IBS (irritable bowel syndrome)   . Migraine   . Seizures (HCC)      Social History   Socioeconomic History  . Marital status: Married    Spouse name: Not on file  . Number of children: 2  . Years of education: Not on file  . Highest education level: Not on file  Social Needs  . Financial resource strain: Not on file  . Food insecurity - worry: Not on file  . Food insecurity - inability: Not on file  . Transportation needs - medical: Not on file  . Transportation needs - non-medical: Not on file  Occupational History  . Not on file  Tobacco Use  . Smoking status: Never Smoker  . Smokeless tobacco: Never Used  Substance and Sexual Activity  . Alcohol use: No    Alcohol/week: 0.0 oz    Comment: wine on occasion  . Drug use: No  . Sexual activity: Not on file  Other Topics Concern  . 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   2 sons      Exercise-no    History reviewed. No pertinent surgical history.  Family History  Problem Relation Age of Onset  . Early death Sister   . Alcohol abuse Mother   . Arthritis Mother   . Lung cancer Mother   . Hypertension Mother   . Mental illness Mother   . Alcohol abuse Father   . Heart disease Father   . Hypertension Father   . Diabetes  Father   . Colon cancer Father   . Arthritis Maternal Grandmother   . Hypertension Maternal Grandmother   . Breast cancer Maternal Grandmother   . Stroke Maternal Grandfather   . Hypertension Maternal Grandfather   . Heart disease Paternal Grandmother   . Hypertension Paternal Grandmother   . Colon cancer Paternal Grandfather   . Heart disease Paternal Grandfather   . Hypertension Paternal Grandfather   . Kidney disease Paternal Grandfather   . Diabetes Paternal Grandfather     Allergies  Allergen Reactions  . Ambien Cr [Zolpidem Tartrate Er]     Seizure   . Cymbalta [Duloxetine Hcl]     Seizure   . Celebrex [Celecoxib]     Hives   . Hydrocodone Itching  . Latex Rash    Adhesive    Current Outpatient Medications on File Prior to Visit  Medication Sig Dispense Refill  . albuterol (PROAIR HFA) 108 (90 Base) MCG/ACT inhaler Inhale 2 puffs into the lungs every 6 (six) hours as needed for wheezing or shortness of breath. 18 g 3  . clonazePAM (KLONOPIN) 0.5 MG tablet Take 1 tablet (0.5 mg total) by mouth 2 (two) times daily as needed for anxiety. 30 tablet  1  . cyclobenzaprine (FLEXERIL) 10 MG tablet Take 1 tablet (10 mg total) by mouth at bedtime. 15 tablet 0  . fluticasone (FLONASE) 50 MCG/ACT nasal spray Place 2 sprays into both nostrils daily. 16 g 6  . fluticasone (FLONASE) 50 MCG/ACT nasal spray Place 2 sprays into both nostrils daily. 16 g 1  . meloxicam (MOBIC) 15 MG tablet Take 0.5-1 tablets (7.5-15 mg total) by mouth daily. 30 tablet 1  . Suvorexant (BELSOMRA) 10 MG TABS Take 1 tablet by mouth at bedtime as needed. 10 tablet 0  . traMADol (ULTRAM) 50 MG tablet 1-2 tab po every 6  Hours as needed for severe pain 21 tablet 0   Current Facility-Administered Medications on File Prior to Visit  Medication Dose Route Frequency Provider Last Rate Last Dose  . lidocaine (PF) (XYLOCAINE) 1 % injection 2 mL  2 mL Other Once Tyrell Antonio, MD        BP 112/72   Pulse 80    Temp 99 F (37.2 C) (Oral)   Resp 16   Wt 209 lb (94.8 kg)   SpO2 97%   BMI 29.99 kg/m      Review of Systems  Constitutional: Positive for activity change, appetite change, fatigue and fever.  HENT: Negative for congestion, dental problem, hearing loss, rhinorrhea, sinus pressure, sore throat and tinnitus.   Eyes: Negative for pain, discharge and visual disturbance.  Respiratory: Negative for cough and shortness of breath.   Cardiovascular: Negative for chest pain, palpitations and leg swelling.  Gastrointestinal: Positive for diarrhea, nausea and vomiting. Negative for abdominal distention, abdominal pain, blood in stool and constipation.  Genitourinary: Negative for difficulty urinating, dysuria, flank pain, frequency, hematuria, pelvic pain, urgency, vaginal bleeding, vaginal discharge and vaginal pain.  Musculoskeletal: Negative for arthralgias, gait problem and joint swelling.  Skin: Negative for rash.  Neurological: Negative for dizziness, syncope, speech difficulty, weakness, numbness and headaches.  Hematological: Negative for adenopathy.  Psychiatric/Behavioral: Negative for agitation, behavioral problems and dysphoric mood. The patient is not nervous/anxious.        Objective:   Physical Exam  Constitutional: She is oriented to person, place, and time. She appears well-developed and well-nourished. No distress.  HENT:  Head: Normocephalic.  Right Ear: External ear normal.  Left Ear: External ear normal.  Erythema of the oropharynx with some mild left tonsillar enlargement  Eyes: Conjunctivae and EOM are normal. Pupils are equal, round, and reactive to light.  Neck: Normal range of motion. Neck supple. No thyromegaly present.  Cardiovascular: Normal rate, regular rhythm, normal heart sounds and intact distal pulses.  Pulmonary/Chest: Effort normal and breath sounds normal.  Abdominal: Soft. Bowel sounds are normal. She exhibits no distension and no mass. There is no  tenderness. There is no rebound and no guarding.  Musculoskeletal: Normal range of motion.  Lymphadenopathy:    She has no cervical adenopathy.  Neurological: She is alert and oriented to person, place, and time.  Skin: Skin is warm and dry. No rash noted.  Psychiatric: She has a normal mood and affect. Her behavior is normal.          Assessment & Plan:   Viral gastroenteritis.  We will force fluids and treat symptomatically  Negative screen for influenza A  Rogelia Boga

## 2018-01-30 NOTE — Patient Instructions (Addendum)

## 2018-03-05 ENCOUNTER — Other Ambulatory Visit: Payer: Self-pay | Admitting: Family Medicine

## 2018-03-05 DIAGNOSIS — G8929 Other chronic pain: Secondary | ICD-10-CM

## 2018-03-05 DIAGNOSIS — M5442 Lumbago with sciatica, left side: Principal | ICD-10-CM

## 2018-03-09 NOTE — Telephone Encounter (Signed)
Requesting:  ULTRAM 50 MG Contract:  12/06/12 UDS: 12/06/12 Last OV:  12/22/17 Next OV:  - Last Refill:  12/22/17  #21   Please advise

## 2018-03-30 ENCOUNTER — Ambulatory Visit: Payer: BC Managed Care – PPO | Admitting: Family Medicine

## 2018-03-30 ENCOUNTER — Encounter: Payer: Self-pay | Admitting: Family Medicine

## 2018-03-30 VITALS — BP 136/68 | HR 69 | Temp 97.8°F | Wt 204.0 lb

## 2018-03-30 DIAGNOSIS — M542 Cervicalgia: Secondary | ICD-10-CM

## 2018-03-30 NOTE — Patient Instructions (Signed)
Please try heat on the area. Please try the exercises if your pain is less than a 2 out of 10. Please try Aspercreme with lidocaine over the area. His follow-up with me in 2-3 weeks if your pain is not improved.

## 2018-03-30 NOTE — Progress Notes (Signed)
Suzanne Singh - 52 y.o. female MRN [id number]  Date of birth: 03-16-1966  SUBJECTIVE:  Including CC & ROS.  Chief Complaint  Patient presents with  . Neck Pain    Suzanne Singh is a 52 y.o. female that is presenting with neck pain. Ongoing for four days. She was moving a lot of furniture and noticed a burning pain after. Pain is located at her neck and trapezius. She does get adjustments by her chiropractor. Admits to decrease range of motion.  Denies tingling or numbness.  Denies any radicular symptoms.  Denies any injury.  Pain is moderate to severe.  Pain is worse with certain movements.  Independent review of the cervical spine from 2010 shows mild degenerative disease.  Review of Systems  Constitutional: Negative for fever.  Respiratory: Negative for cough.   Cardiovascular: Negative for chest pain.  Gastrointestinal: Negative for abdominal pain.  Musculoskeletal: Positive for neck pain.  Skin: Negative for color change.  Allergic/Immunologic: Negative for immunocompromised state.  Neurological: Negative for weakness.  Hematological: Negative for adenopathy.  Psychiatric/Behavioral: Negative for agitation.    HISTORY: Past Medical, Surgical, Social, and Family History Reviewed & Updated per EMR.   Pertinent Historical Findings include:  Past Medical History:  Diagnosis Date  . Arrhythmia   . Arthritis   . Depression   . Eating disorder   . Fibromyalgia   . GERD (gastroesophageal reflux disease)   . IBS (irritable bowel syndrome)   . Migraine   . Seizures (HCC)     No past surgical history on file.  Allergies  Allergen Reactions  . Ambien Cr [Zolpidem Tartrate Er]     Seizure   . Cymbalta [Duloxetine Hcl]     Seizure   . Celebrex [Celecoxib]     Hives   . Hydrocodone Itching  . Latex Rash    Adhesive    Family History  Problem Relation Age of Onset  . Early death Sister   . Alcohol abuse Mother   . Arthritis Mother   . Lung cancer Mother   .  Hypertension Mother   . Mental illness Mother   . Alcohol abuse Father   . Heart disease Father   . Hypertension Father   . Diabetes Father   . Colon cancer Father   . Arthritis Maternal Grandmother   . Hypertension Maternal Grandmother   . Breast cancer Maternal Grandmother   . Stroke Maternal Grandfather   . Hypertension Maternal Grandfather   . Heart disease Paternal Grandmother   . Hypertension Paternal Grandmother   . Colon cancer Paternal Grandfather   . Heart disease Paternal Grandfather   . Hypertension Paternal Grandfather   . Kidney disease Paternal Grandfather   . Diabetes Paternal Grandfather      Social History   Socioeconomic History  . Marital status: Married    Spouse name: Not on file  . Number of children: 2  . Years of education: Not on file  . Highest education level: Not on file  Occupational History  . Not on file  Social Needs  . Financial resource strain: Not on file  . Food insecurity:    Worry: Not on file    Inability: Not on file  . Transportation needs:    Medical: Not on file    Non-medical: Not on file  Tobacco Use  . Smoking status: Never Smoker  . Smokeless tobacco: Never Used  Substance and Sexual Activity  . Alcohol use: No    Alcohol/week: 0.0 oz  Comment: wine on occasion  . Drug use: No  . Sexual activity: Not on file  Lifestyle  . Physical activity:    Days per week: Not on file    Minutes per session: Not on file  . Stress: Not on file  Relationships  . Social connections:    Talks on phone: Not on file    Gets together: Not on file    Attends religious service: Not on file    Active member of club or organization: Not on file    Attends meetings of clubs or organizations: Not on file    Relationship status: Not on file  . Intimate partner violence:    Fear of current or ex partner: Not on file    Emotionally abused: Not on file    Physically abused: Not on file    Forced sexual activity: Not on file  Other  Topics Concern  . 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   2 sons      Exercise-no     PHYSICAL EXAM:  VS: BP 136/68 (BP Location: Left Arm, Patient Position: Sitting, Cuff Size: Normal)   Pulse 69   Temp 97.8 F (36.6 C) (Oral)   Wt 204 lb (92.5 kg)   SpO2 97%   BMI 29.27 kg/m  Physical Exam Gen: NAD, alert, cooperative with exam, well-appearing ENT: normal lips, normal nasal mucosa,  Eye: normal EOM, normal conjunctiva and lids CV:  no edema, +2 pedal pulses   Resp: no accessory muscle use, non-labored,  Skin: no rashes, no areas of induration  Neuro: normal tone, normal sensation to touch Psych:  normal insight, alert and oriented MSK:  Neck: No tenderness to palpation of the cervical midline spine. Some tenderness to palpation over the trapezius and cervical spinal muscles. Pain with lateral rotation and flexion. Normal strength resistance with shrug. Normal shoulder range of motion on the right. Neurovascularly intact     ASSESSMENT & PLAN:   Neck pain Pain likely muscular in nature. Not suggestive of radicular in origin  - IM depo  - counseled on HEP  - if no improvement consider imaging, trigger point injections or PT

## 2018-03-31 DIAGNOSIS — M542 Cervicalgia: Secondary | ICD-10-CM | POA: Insufficient documentation

## 2018-03-31 NOTE — Assessment & Plan Note (Signed)
Pain likely muscular in nature. Not suggestive of radicular in origin  - IM depo  - counseled on HEP  - if no improvement consider imaging, trigger point injections or PT

## 2018-04-01 ENCOUNTER — Other Ambulatory Visit: Payer: Self-pay | Admitting: Family Medicine

## 2018-04-01 ENCOUNTER — Telehealth: Payer: Self-pay | Admitting: Family Medicine

## 2018-04-01 NOTE — Telephone Encounter (Signed)
Patient calling to inquire on the medication being sent to the pharmacy. Please advise.

## 2018-04-01 NOTE — Telephone Encounter (Signed)
Copied from CRM [id number]. Topic: Quick Communication - See Telephone Encounter >> Apr 01, 2018  1:52 PM Arlyss Gandyichardson, Delson Dulworth N, NT wrote: CRM for notification. See Telephone encounter for: 04/01/18. Pt seen Dr. Jordan LikesSchmitz on 03/30/18 and got an injection in her neck. States it is not working and wants to see if a muscle relaxer can be called in? [facility] [phone] - [place], [place] - [address] [facility] [address] [phone] (Phone) [phone] (Fax)

## 2018-04-02 MED ORDER — CYCLOBENZAPRINE HCL 5 MG PO TABS: 5.0000 mg | ORAL_TABLET | Freq: Three times a day (TID) | ORAL | 1 refills | Status: DC | PRN

## 2018-04-02 NOTE — Telephone Encounter (Signed)
Pt calling back and states she has taken the Flexeril before and needs something stronger than that. She states it does not help her.

## 2018-04-02 NOTE — Telephone Encounter (Signed)
Flexeril sent in.   Myra RudeSchmitz, Rhiannon Sassaman E, MD [facility] 04/02/2018, 10:05 AM

## 2018-04-05 NOTE — Telephone Encounter (Signed)
Pt given information per notes of Dr. Jordan Likes on 04/05/18. Pt verbalized understanding and states she would like to try a different muscle relaxer. Pt would like for it to be sent to [facility] on [address]

## 2018-04-05 NOTE — Telephone Encounter (Signed)
Left VM for patient. If she calls back please have her speak with a nurse/CMA and inform that the muscle relaxers are all the same strength. We could try a different one if she would like. The [facility] can report results to patient.   If any questions then please take the best time and phone number to call and I will try to call her back.   Myra Rude, MD [facility] 04/05/2018, 4:50 PM

## 2018-04-06 MED ORDER — METHOCARBAMOL 500 MG PO TABS: 500.0000 mg | ORAL_TABLET | Freq: Three times a day (TID) | ORAL | 0 refills | Status: DC

## 2018-04-06 NOTE — Addendum Note (Signed)
Addended by: Myra Rude on: 04/06/2018 04:53 PM   Modules accepted: Orders

## 2018-04-06 NOTE — Telephone Encounter (Signed)
Robaxin was sent in.   Myra Rude, MD [facility] 04/06/2018, 4:53 PM

## 2018-04-27 ENCOUNTER — Telehealth: Payer: Self-pay | Admitting: *Deleted

## 2018-04-27 DIAGNOSIS — M25561 Pain in right knee: Principal | ICD-10-CM

## 2018-04-27 DIAGNOSIS — M25562 Pain in left knee: Principal | ICD-10-CM

## 2018-04-27 DIAGNOSIS — G8929 Other chronic pain: Secondary | ICD-10-CM

## 2018-04-27 NOTE — Telephone Encounter (Signed)
Copied from CRM [id number]. Topic: Referral - Request >> Apr 27, 2018 12:17 PM Crist Infante wrote: Reason for CRM: pt states her right knee is bothering her again and requesting a referral to [facility].  Pt thinks she needs an injection. Wants to know if that is something dr Laury Axon could do? Pt would like asap. Pt is going to call their office to inquire if she needs a referral as well

## 2018-04-27 NOTE — Telephone Encounter (Signed)
Referral placed and patient notified.  She has an appt on 04/29/18, she just needed a referral.

## 2018-04-29 ENCOUNTER — Encounter: Payer: Self-pay | Admitting: Family Medicine

## 2018-04-29 ENCOUNTER — Ambulatory Visit: Payer: BC Managed Care – PPO | Admitting: Family Medicine

## 2018-04-29 DIAGNOSIS — G8929 Other chronic pain: Secondary | ICD-10-CM | POA: Diagnosis not present

## 2018-04-29 DIAGNOSIS — M25562 Pain in left knee: Secondary | ICD-10-CM

## 2018-04-29 DIAGNOSIS — M25561 Pain in right knee: Secondary | ICD-10-CM | POA: Diagnosis not present

## 2018-04-29 MED ORDER — METHYLPREDNISOLONE ACETATE 40 MG/ML IJ SUSP
40.0000 mg | Freq: Once | INTRAMUSCULAR | Status: AC
  Administered 2018-04-29: 40 mg via INTRA_ARTICULAR

## 2018-04-29 NOTE — Patient Instructions (Signed)
Your pain is due to arthritis. These are the different medications you can take for this: Tylenol  1-2 tabs three times a day for pain. Capsaicin, aspercreme, or biofreeze topically up to four times a day may also help with pain. Some supplements that may help for arthritis: Boswellia extract, curcumin, pycnogenol Meloxicam as you have been. Cortisone injections are an option - you were given these today. If cortisone injections do not help, there are different types of shots that may help but they take longer to take effect. It's important that you continue to stay active. Straight leg raises, knee extensions 3 sets of 10 once a day (add ankle weight if these become too easy). Consider physical therapy to strengthen muscles around the joint that hurts to take pressure off of the joint itself. Shoe inserts with good arch support may be helpful. Heat or ice 15 minutes at a time 3-4 times a day as needed to help with pain. Water aerobics and cycling with low resistance are the best two types of exercise for arthritis though any exercise is ok as long as it doesn't worsen the pain. Follow up with me in 1 month or as needed if you're doing well.

## 2018-05-03 ENCOUNTER — Encounter: Payer: Self-pay | Admitting: Family Medicine

## 2018-05-03 NOTE — Progress Notes (Signed)
PCP and consultation requested by: Suzanne Schultz, DO  Subjective:   HPI: Patient is a 52 y.o. female here for bilateral knee pain.  Patient was seen previously for knee pain almost 3 years ago. She states she's currently having right > left knee pain. Pain level 10/10 right knee anteromedially, 6/10 left knee. Wearing a brace on right knee. Pain is like a toothache and sharp. Has tried icing, aspercreme, rest. Previously responded to cortisone injections. No new injuries. No numbness, skin changes.  Past Medical History:  Diagnosis Date  . Arrhythmia   . Arthritis   . Depression   . Eating disorder   . Fibromyalgia   . GERD (gastroesophageal reflux disease)   . IBS (irritable bowel syndrome)   . Migraine   . Seizures (HCC)     Current Outpatient Medications on File Prior to Visit  Medication Sig Dispense Refill  . albuterol (PROAIR HFA) 108 (90 Base) MCG/ACT inhaler Inhale 2 puffs into the lungs every 6 (six) hours as needed for wheezing or shortness of breath. 18 g 3  . clonazePAM (KLONOPIN) 0.5 MG tablet Take 1 tablet (0.5 mg total) by mouth 2 (two) times daily as needed for anxiety. 30 tablet 1  . cyclobenzaprine (FLEXERIL) 5 MG tablet Take 1 tablet (5 mg total) by mouth 3 (three) times daily as needed for muscle spasms. 30 tablet 1  . fluticasone (FLONASE) 50 MCG/ACT nasal spray Place 2 sprays into both nostrils daily. 16 g 6  . fluticasone (FLONASE) 50 MCG/ACT nasal spray Place 2 sprays into both nostrils daily. 16 g 1  . meloxicam (MOBIC) 15 MG tablet TAKE 1/2 TO 1 TABLET(7.5 TO 15 MG) BY MOUTH DAILY 30 tablet 0  . methocarbamol (ROBAXIN) 500 MG tablet Take 1 tablet (500 mg total) by mouth 3 (three) times daily. 90 tablet 0  . Suvorexant (BELSOMRA) 10 MG TABS Take 1 tablet by mouth at bedtime as needed. 10 tablet 0  . traMADol (ULTRAM) 50 MG tablet TAKE 1 TO 2 TABLETS BY MOUTH EVERY 6 HOURS AS NEEDED FOR SEVERE PAIN 21 tablet 0   Current Facility-Administered  Medications on File Prior to Visit  Medication Dose Route Frequency Provider Last Rate Last Dose  . lidocaine (PF) (XYLOCAINE) 1 % injection 2 mL  2 mL Other Once Tyrell Antonio, MD        History reviewed. No pertinent surgical history.  Allergies  Allergen Reactions  . Ambien Cr [Zolpidem Tartrate Er]     Seizure   . Cymbalta [Duloxetine Hcl]     Seizure   . Celebrex [Celecoxib]     Hives   . Hydrocodone Itching  . Latex Rash    Adhesive    Social History   Socioeconomic History  . Marital status: Married    Spouse name: Not on file  . Number of children: 2  . Years of education: Not on file  . Highest education level: Not on file  Occupational History  . Not on file  Social Needs  . Financial resource strain: Not on file  . Food insecurity:    Worry: Not on file    Inability: Not on file  . Transportation needs:    Medical: Not on file    Non-medical: Not on file  Tobacco Use  . Smoking status: Never Smoker  . Smokeless tobacco: Never Used  Substance and Sexual Activity  . Alcohol use: No    Alcohol/week: 0.0 oz    Comment: wine on  occasion  . Drug use: No  . Sexual activity: Not on file  Lifestyle  . Physical activity:    Days per week: Not on file    Minutes per session: Not on file  . Stress: Not on file  Relationships  . Social connections:    Talks on phone: Not on file    Gets together: Not on file    Attends religious service: Not on file    Active member of club or organization: Not on file    Attends meetings of clubs or organizations: Not on file    Relationship status: Not on file  . Intimate partner violence:    Fear of current or ex partner: Not on file    Emotionally abused: Not on file    Physically abused: Not on file    Forced sexual activity: Not on file  Other Topics Concern  . 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   2 sons      Exercise-no    Family History  Problem Relation Age of Onset  . Early death Sister   . Alcohol abuse Mother   . Arthritis Mother   . Lung cancer Mother   . Hypertension Mother   . Mental illness Mother   . Alcohol abuse Father   . Heart disease Father   . Hypertension Father   . Diabetes Father   . Colon cancer Father   . Arthritis Maternal Grandmother   . Hypertension Maternal Grandmother   . Breast cancer Maternal Grandmother   . Stroke Maternal Grandfather   . Hypertension Maternal Grandfather   . Heart disease Paternal Grandmother   . Hypertension Paternal Grandmother   . Colon cancer Paternal Grandfather   . Heart disease Paternal Grandfather   . Hypertension Paternal Grandfather   . Kidney disease Paternal Grandfather   . Diabetes Paternal Grandfather     BP 99/65   Pulse 89   Ht  (1.778 m)   Wt 204 lb (92.5 kg)   BMI 29.27 kg/m   Review of Systems: See HPI above.     Objective:  Physical Exam:  Gen: NAD, comfortable in exam room  Right knee: No gross deformity, ecchymoses.  Mild effusion. TTP medial joint line.  No other tenderness. FROM with 5/5 strength. Negative ant/post drawers. Negative valgus/varus testing. Negative lachmanns. Negative mcmurrays, apleys, patellar apprehension. NV intact distally.  Left knee: No gross deformity, ecchymoses.  Mild effusion. TTP medial joint line.  No other tenderness. FROM with 5/5 strength. Negative ant/post drawers. Negative valgus/varus testing. Negative lachmanns. Negative mcmurrays, apleys, patellar apprehension. NV intact distally.   Assessment & Plan:  1. Bilateral knee pain - 2/2 arthritis.  Discussed topical medications, meloxicam, tylenol, supplements that may help.  Home exercises reviewed.  Bilateral cortisone injections given.  Heat/ice.  F/u in 1 month or prn.  After informed written consent timeout was performed, patient was seated on exam table. Right  knee was prepped with alcohol swab and utilizing anteromedial approach, patient's right knee was injected intraarticularly with 3:1 bupivicaine: depomedrol. Patient tolerated the procedure well without immediate complications.  After informed written consent timeout was performed, patient was seated on exam table. Left knee was prepped with alcohol swab and utilizing anteromedial approach, patient's left knee was injected intraarticularly with 3:1 bupivicaine: depomedrol. Patient tolerated the procedure well without immediate complications.

## 2018-05-03 NOTE — Assessment & Plan Note (Signed)
2/2 arthritis.  Discussed topical medications, meloxicam, tylenol, supplements that may help.  Home exercises reviewed.  Bilateral cortisone injections given.  Heat/ice.  F/u in 1 month or prn.  After informed written consent timeout was performed, patient was seated on exam table. Right knee was prepped with alcohol swab and utilizing anteromedial approach, patient's right knee was injected intraarticularly with 3:1 bupivicaine: depomedrol. Patient tolerated the procedure well without immediate complications.  After informed written consent timeout was performed, patient was seated on exam table. Left knee was prepped with alcohol swab and utilizing anteromedial approach, patient's left knee was injected intraarticularly with 3:1 bupivicaine: depomedrol. Patient tolerated the procedure well without immediate complications.

## 2018-05-04 ENCOUNTER — Other Ambulatory Visit: Payer: Self-pay | Admitting: Family Medicine

## 2018-05-04 DIAGNOSIS — G8929 Other chronic pain: Secondary | ICD-10-CM

## 2018-05-04 DIAGNOSIS — M5442 Lumbago with sciatica, left side: Principal | ICD-10-CM

## 2018-05-04 NOTE — Telephone Encounter (Signed)
Database ran and is on your desk for review.  Last filled per database: 03/10/18 Last written: 03/09/18 Last ov: 12/22/17 Next ov: none Contract: none UDS: none

## 2018-06-17 ENCOUNTER — Other Ambulatory Visit: Payer: Self-pay | Admitting: Family Medicine

## 2018-07-05 ENCOUNTER — Other Ambulatory Visit: Payer: Self-pay | Admitting: Family Medicine

## 2018-07-05 DIAGNOSIS — F419 Anxiety disorder, unspecified: Secondary | ICD-10-CM

## 2018-07-06 NOTE — Telephone Encounter (Signed)
Ok to refill x1 but need contract and uds and ov

## 2018-07-06 NOTE — Telephone Encounter (Signed)
Requesting:klonopin Contract:no UDS:no Last OV:09/16/17 Next OV:not scheduled  Last Refill:12/22/17  #[id number] Database:   Please advise

## 2018-07-09 ENCOUNTER — Other Ambulatory Visit: Payer: Self-pay

## 2018-07-29 ENCOUNTER — Other Ambulatory Visit: Payer: Self-pay | Admitting: Family Medicine

## 2018-08-27 ENCOUNTER — Other Ambulatory Visit: Payer: Self-pay | Admitting: Obstetrics and Gynecology

## 2018-08-27 DIAGNOSIS — Z803 Family history of malignant neoplasm of breast: Secondary | ICD-10-CM

## 2018-08-27 LAB — HM PAP SMEAR: HM Pap smear: NORMAL

## 2018-09-14 ENCOUNTER — Other Ambulatory Visit: Payer: Self-pay | Admitting: Family Medicine

## 2018-10-28 ENCOUNTER — Ambulatory Visit (INDEPENDENT_AMBULATORY_CARE_PROVIDER_SITE_OTHER): Payer: BC Managed Care – PPO

## 2018-10-28 ENCOUNTER — Encounter: Payer: Self-pay | Admitting: Family Medicine

## 2018-10-28 ENCOUNTER — Ambulatory Visit: Payer: BC Managed Care – PPO | Admitting: Family Medicine

## 2018-10-28 DIAGNOSIS — M25561 Pain in right knee: Secondary | ICD-10-CM

## 2018-10-28 DIAGNOSIS — Z23 Encounter for immunization: Secondary | ICD-10-CM

## 2018-10-28 MED ORDER — OXYCODONE-ACETAMINOPHEN 5-325 MG PO TABS: 1.0000 | ORAL_TABLET | Freq: Four times a day (QID) | ORAL | 0 refills | Status: DC | PRN

## 2018-10-28 MED ORDER — METHYLPREDNISOLONE ACETATE 40 MG/ML IJ SUSP
40.0000 mg | Freq: Once | INTRAMUSCULAR | Status: AC
  Administered 2018-10-28: 40 mg via INTRA_ARTICULAR

## 2018-10-28 NOTE — Patient Instructions (Signed)
Your pain is due to arthritis. These are the different medications you can take for this: Tylenol 500mg  1-2 tabs three times a day for pain. Capsaicin, aspercreme, or biofreeze topically up to four times a day may also help with pain. Some supplements that may help for arthritis: Boswellia extract, curcumin, pycnogenol Meloxicam 15mg  daily as needed for pain and inflammation. Cortisone injections are an option - you were given this today. If cortisone injections do not help, there are different types of shots that may help but they take longer to take effect. It's important that you continue to stay active. Straight leg raises, knee extensions 3 sets of 10 once a day (add ankle weight if these become too easy). Consider physical therapy to strengthen muscles around the joint that hurts to take pressure off of the joint itself. Shoe inserts with good arch support may be helpful. Heat or ice 15 minutes at a time 3-4 times a day as needed to help with pain. Water aerobics and cycling with low resistance are the best two types of exercise for arthritis though any exercise is ok as long as it doesn't worsen the pain. Follow up with me as needed.

## 2018-10-29 ENCOUNTER — Encounter: Payer: Self-pay | Admitting: Family Medicine

## 2018-10-29 NOTE — Progress Notes (Signed)
PCP and consultation requested by: [name], Yvonne R, DO  Subjective:   HPI: Patient is a 52 y.o. female here for bilateral knee pain.  5/23: Patient was seen previously for knee pain almost 3 years ago. She states she's currently having right > left knee pain. Pain level 10/10 right knee anteromedially, 6/10 left knee. Wearing a brace on right knee. Pain is like a toothache and sharp. Has tried icing, aspercreme, rest. Previously responded to cortisone injections. No new injuries. No numbness, skin changes.  11/21: Patient reports injection helped a lot until about 2 weeks ago. Pain anterior right knee but started posteriorly. Pain level 6/10 and sharp, worse with the weather changes. Tried wrapping this, ice/heat. Some swelling. Tried bengay as well. No skin changes.  Past Medical History:  Diagnosis Date  . Arrhythmia   . Arthritis   . Depression   . Eating disorder   . Fibromyalgia   . GERD (gastroesophageal reflux disease)   . IBS (irritable bowel syndrome)   . Migraine   . Seizures (HCC)     Current Outpatient Medications on File Prior to Visit  Medication Sig Dispense Refill  . albuterol (PROAIR HFA) 108 (90 Base) MCG/ACT inhaler Inhale 2 puffs into the lungs every 6 (six) hours as needed for wheezing or shortness of breath. 18 g 3  . clonazePAM (KLONOPIN) 0.5 MG tablet TAKE 1 TABLET(0.5 MG) BY MOUTH TWICE DAILY AS NEEDED FOR ANXIETY 30 tablet 0  . fluticasone (FLONASE) 50 MCG/ACT nasal spray Place 2 sprays into both nostrils daily. 16 g 6  . meloxicam (MOBIC) 15 MG tablet TAKE 1/2 TO 1 TABLET(7.5 TO 15 MG) BY MOUTH DAILY 30 tablet 1  . Suvorexant (BELSOMRA) 10 MG TABS Take 1 tablet by mouth at bedtime as needed. 10 tablet 0   Current Facility-Administered Medications on File Prior to Visit  Medication Dose Route Frequency Provider Last Rate Last Dose  . lidocaine (PF) (XYLOCAINE) 1 % injection 2 mL  2 mL Other Once Tyrell AntonioNewton, Frederic, MD        History  reviewed. No pertinent surgical history.  Allergies  Allergen Reactions  . Ambien Cr [Zolpidem Tartrate Er]     Seizure   . Cymbalta [Duloxetine Hcl]     Seizure   . Celebrex [Celecoxib]     Hives   . Hydrocodone Itching  . Latex Rash    Adhesive    Social History   Socioeconomic History  . Marital status: Married    Spouse name: Not on file  . Number of children: 2  . Years of education: Not on file  . Highest education level: Not on file  Occupational History  . Not on file  Social Needs  . Financial resource strain: Not on file  . Food insecurity:    Worry: Not on file    Inability: Not on file  . Transportation needs:    Medical: Not on file    Non-medical: Not on file  Tobacco Use  . Smoking status: Never Smoker  . Smokeless tobacco: Never Used  Substance and Sexual Activity  . Alcohol use: No    Alcohol/week: 0.0 standard drinks    Comment: wine on occasion  . Drug use: No  . Sexual activity: Not on file  Lifestyle  . Physical activity:    Days per week: Not on file    Minutes per session: Not on file  . Stress: Not on file  Relationships  . Social connections:  Talks on phone: Not on file    Gets together: Not on file    Attends religious service: Not on file    Active member of club or organization: Not on file    Attends meetings of clubs or organizations: Not on file    Relationship status: Not on file  . Intimate partner violence:    Fear of current or ex partner: Not on file    Emotionally abused: Not on file    Physically abused: Not on file    Forced sexual activity: Not on file  Other Topics Concern  . 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   2 sons      Exercise-no    Family History  Problem Relation Age of Onset  . Early death Sister   . Alcohol abuse Mother   . Arthritis Mother   . Lung cancer  Mother   . Hypertension Mother   . Mental illness Mother   . Alcohol abuse Father   . Heart disease Father   . Hypertension Father   . Diabetes Father   . Colon cancer Father   . Arthritis Maternal Grandmother   . Hypertension Maternal Grandmother   . Breast cancer Maternal Grandmother   . Stroke Maternal Grandfather   . Hypertension Maternal Grandfather   . Heart disease Paternal Grandmother   . Hypertension Paternal Grandmother   . Colon cancer Paternal Grandfather   . Heart disease Paternal Grandfather   . Hypertension Paternal Grandfather   . Kidney disease Paternal Grandfather   . Diabetes Paternal Grandfather     BP 119/87   Pulse 78   Ht 5\' 10"  (1.778 m)   Wt 206 lb (93.4 kg)   BMI 29.56 kg/m   Review of Systems: See HPI above.     Objective:  Physical Exam:  Gen: NAD, comfortable in exam room  Right knee: No gross deformity, ecchymoses, minimal swelling. TTP medial joint line.  No other tenderness. FROM with 5/5 strength flexion and extension. Negative ant/post drawers. Negative valgus/varus testing. Negative lachmans. Negative mcmurrays, apleys, patellar apprehension. NV intact distally.  Left knee: No deformity. FROM with 5/5 strength. Minimal tenderness to palpation medial joint line. NVI distally.   Assessment & Plan:  1. Bilateral knee pain - worse right knee.  Injection given today.  Meloxicam, topical medications, tylenol, supplements reviewed.  Home exercises shown.  Heat or ice.  F/u prn.  After informed written consent timeout was performed, patient was seated on exam table. Right knee was prepped with alcohol swab and utilizing anteromedial approach, patient's right knee was injected intraarticularly with 3:1 bupivicaine: depomedrol. Patient tolerated the procedure well without immediate complications.

## 2018-12-07 ENCOUNTER — Encounter: Payer: Self-pay | Admitting: Medical

## 2018-12-07 ENCOUNTER — Ambulatory Visit: Payer: BC Managed Care – PPO | Admitting: Medical

## 2018-12-07 ENCOUNTER — Ambulatory Visit (HOSPITAL_BASED_OUTPATIENT_CLINIC_OR_DEPARTMENT_OTHER)
Admission: RE | Admit: 2018-12-07 | Discharge: 2018-12-07 | Disposition: A | Discharge status: Home / Self Care | Payer: BC Managed Care – PPO | Source: Ambulatory Visit | Attending: Medical | Admitting: Medical

## 2018-12-07 VITALS — BP 104/67 | HR 82 | Temp 98.3°F | Resp 16 | Ht 70.0 in | Wt 209.8 lb

## 2018-12-07 DIAGNOSIS — M25552 Pain in left hip: Secondary | ICD-10-CM | POA: Diagnosis present

## 2018-12-07 DIAGNOSIS — G8929 Other chronic pain: Secondary | ICD-10-CM

## 2018-12-07 DIAGNOSIS — M5442 Lumbago with sciatica, left side: Secondary | ICD-10-CM

## 2018-12-07 MED ORDER — KETOROLAC TROMETHAMINE 30 MG/ML IJ SOLN: 30.0000 mg | Freq: Once | INTRAMUSCULAR | 0 refills | Status: DC

## 2018-12-07 MED ORDER — CYCLOBENZAPRINE HCL 10 MG PO TABS: 10.0000 mg | ORAL_TABLET | Freq: Every day | ORAL | 0 refills | Status: DC

## 2018-12-07 MED ORDER — TRAMADOL HCL 50 MG PO TABS: 50.0000 mg | ORAL_TABLET | Freq: Four times a day (QID) | ORAL | 0 refills | Status: DC | PRN

## 2018-12-07 MED ORDER — KETOROLAC TROMETHAMINE 30 MG/ML IJ SOLN
30.0000 mg | Freq: Once | INTRAMUSCULAR | Status: AC
  Administered 2018-12-07: 30 mg via INTRAMUSCULAR

## 2018-12-07 MED ORDER — KETOROLAC TROMETHAMINE 30 MG/ML IJ SOLN: 30.0000 mg | Freq: Once | INTRAMUSCULAR | Status: DC

## 2018-12-07 NOTE — Patient Instructions (Signed)
You do have combination of recurrent lower back/left-sided sciatica type pain with a recent left hip pain as well.  Prior x-ray done over the last year of lumbar spine.  But will get x-ray of the left hip today.  We gave you Toradol 30 mg IM injection.  I recommend not using any over-the-counter NSAIDs over the next 24 hours.  I did refill your Flexeril muscle relaxant and tramadol pain medication.  Will see how you respond to the above treatment.  Might consider getting you tapered prednisone if needed.  I will notify you when x-ray results are in.  Follow-up in 7 to 10 days or as needed.

## 2018-12-07 NOTE — Progress Notes (Signed)
Subjective:    Patient ID: Suzanne Singh, female    DOB: 1966-10-21, 52 y.o.   MRN: [id number]  HPI  Pt in with some left side hip pain just recently. Pain came on 4 days ago. States area is very painful. She tried ibuprofen and tinge unit. Pain when stands or changes positions.  Also has some left side lumbar and sciatic area pain.  No acute trauma or fall. But does briefly describe more active around her house.    Review of Systems  Constitutional: Negative for chills, fatigue and fever.  HENT: Negative for congestion.   Respiratory: Negative for chest tightness, shortness of breath and wheezing.   Cardiovascular: Negative for chest pain and palpitations.  Genitourinary: Negative for dysuria, flank pain, frequency, pelvic pain and urgency.  Musculoskeletal: Positive for back pain.       Hip pain.  Skin: Negative for rash.  Neurological: Negative for dizziness, speech difficulty and headaches.    Past Medical History:  Diagnosis Date  . Arrhythmia   . Arthritis   . Depression   . Eating disorder   . Fibromyalgia   . GERD (gastroesophageal reflux disease)   . IBS (irritable bowel syndrome)   . Migraine   . Seizures (HCC)      Social History   Socioeconomic History  . Marital status: Married    Spouse name: Not on file  . Number of children: 2  . Years of education: Not on file  . Highest education level: Not on file  Occupational History  . Not on file  Social Needs  . Financial resource strain: Not on file  . Food insecurity:    Worry: Not on file    Inability: Not on file  . Transportation needs:    Medical: Not on file    Non-medical: Not on file  Tobacco Use  . Smoking status: Never Smoker  . Smokeless tobacco: Never Used  Substance and Sexual Activity  . Alcohol use: No    Alcohol/week: 0.0 standard drinks    Comment: wine on occasion  . Drug use: No  . Sexual activity: Not on file  Lifestyle  . Physical activity:    Days per week: Not on file     Minutes per session: Not on file  . Stress: Not on file  Relationships  . Social connections:    Talks on phone: Not on file    Gets together: Not on file    Attends religious service: Not on file    Active member of club or organization: Not on file    Attends meetings of clubs or organizations: Not on file    Relationship status: Not on file  . Intimate partner violence:    Fear of current or ex partner: Not on file    Emotionally abused: Not on file    Physically abused: Not on file    Forced sexual activity: Not on file  Other Topics Concern  . 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   2 sons      Exercise-no    No past surgical history on file.  Family History  Problem Relation Age of Onset  . Early death Sister   . Alcohol abuse Mother   . Arthritis Mother   . Lung cancer Mother   . Hypertension Mother   . Mental illness Mother   .  Alcohol abuse Father   . Heart disease Father   . Hypertension Father   . Diabetes Father   . Colon cancer Father   . Arthritis Maternal Grandmother   . Hypertension Maternal Grandmother   . Breast cancer Maternal Grandmother   . Stroke Maternal Grandfather   . Hypertension Maternal Grandfather   . Heart disease Paternal Grandmother   . Hypertension Paternal Grandmother   . Colon cancer Paternal Grandfather   . Heart disease Paternal Grandfather   . Hypertension Paternal Grandfather   . Kidney disease Paternal Grandfather   . Diabetes Paternal Grandfather     Allergies  Allergen Reactions  . Ambien Cr [Zolpidem Tartrate Er]     Seizure   . Cymbalta [Duloxetine Hcl]     Seizure   . Celebrex [Celecoxib]     Hives   . Hydrocodone Itching  . Latex Rash    Adhesive    Current Outpatient Medications on File Prior to Visit  Medication Sig Dispense Refill  . albuterol (PROAIR HFA) 108 (90 Base)  MCG/ACT inhaler Inhale 2 puffs into the lungs every 6 (six) hours as needed for wheezing or shortness of breath. 18 g 3  . clonazePAM (KLONOPIN) 0.5 MG tablet TAKE 1 TABLET(0.5 MG) BY MOUTH TWICE DAILY AS NEEDED FOR ANXIETY 30 tablet 0  . fluticasone (FLONASE) 50 MCG/ACT nasal spray Place 2 sprays into both nostrils daily. 16 g 6  . meloxicam (MOBIC) 15 MG tablet TAKE 1/2 TO 1 TABLET(7.5 TO 15 MG) BY MOUTH DAILY 30 tablet 1  . oxyCODONE-acetaminophen (PERCOCET/ROXICET) 5-325 MG tablet Take 1 tablet by mouth every 6 (six) hours as needed for severe pain. 10 tablet 0  . Suvorexant (BELSOMRA) 10 MG TABS Take 1 tablet by mouth at bedtime as needed. 10 tablet 0   Current Facility-Administered Medications on File Prior to Visit  Medication Dose Route Frequency Provider Last Rate Last Dose  . lidocaine (PF) (XYLOCAINE) 1 % injection 2 mL  2 mL Other Once Tyrell Antonio, MD        BP 104/67   Pulse 82   Temp 98.3 F (36.8 C) (Oral)   Resp 16   Ht 5\' 10"  (1.778 m)   Wt 209 lb 12.8 oz (95.2 kg)   SpO2 100%   BMI 30.10 kg/m       Objective:   Physical Exam  General Appearance- Not in acute distress.    Chest and Lung Exam Auscultation: Breath sounds:-Normal. Clear even and unlabored. Adventitious sounds:- No Adventitious sounds.  Cardiovascular Auscultation:Rythm - Regular, rate and rythm. Heart Sounds -Normal heart sounds.  Abdomen Inspection:-Inspection Normal.  Palpation/Perucssion: Palpation and Percussion of the abdomen reveal- Non Tender, No Rebound tenderness, No rigidity(Guarding) and No Palpable abdominal masses.  Liver:-Normal.  Spleen:- Normal.   Back No Mid lumbar spine tenderness to palpation. But left si tenderness Pain on straight leg lift. Pain on lateral movements and flexion/extension of the spine.  Lower ext neurologic  L5-S1 sensation intact bilaterally. Normal patellar reflexes bilaterally. No foot drop bilaterally.  Left hip- mild pain on  palpation and range of motion.      Assessment & Plan:  You do have combination of recurrent lower back/left-sided sciatica type pain with a recent left hip pain as well.  Prior x-ray done over the last year of lumbar spine.  But will get x-ray of the left hip today.  We gave you Toradol 30 mg IM injection.  I recommend not using any over-the-counter NSAIDs over  the next 24 hours.  I did refill your Flexeril muscle relaxant and tramadol pain medication.  Will see how you respond to the above treatment.  Might consider getting you tapered prednisone if needed.  I will notify you when x-ray results are in.  Follow-up in 7 to 10 days or as needed.  Suzanne RichtersEdward Maegan Buller, PA-C

## 2018-12-08 ENCOUNTER — Encounter: Payer: Self-pay | Admitting: Medical

## 2018-12-09 ENCOUNTER — Telehealth: Payer: Self-pay | Admitting: Medical

## 2018-12-09 MED ORDER — PREDNISONE 10 MG (21) PO TBPK: ORAL_TABLET | ORAL | 0 refills | Status: DC

## 2018-12-09 NOTE — Telephone Encounter (Signed)
Rx prednisone sent to pt pharmacy. Notified pt by my chart.,

## 2018-12-09 NOTE — Telephone Encounter (Signed)
Rx prednisone to sent to pt pharmacy.

## 2018-12-09 NOTE — Telephone Encounter (Signed)
Patient called after receiving xray results and note from [facility], [place]. She continues to have moderate to severe pain especially in the left hip stating she can walk gingerly with the pain however she cannot bend over due to the pain. 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 Routing to PCP.

## 2018-12-24 ENCOUNTER — Ambulatory Visit: Payer: BC Managed Care – PPO | Admitting: Family Medicine

## 2019-02-10 ENCOUNTER — Encounter: Payer: Self-pay | Admitting: Family Medicine

## 2019-02-10 ENCOUNTER — Ambulatory Visit (INDEPENDENT_AMBULATORY_CARE_PROVIDER_SITE_OTHER): Payer: BC Managed Care – PPO | Admitting: Family Medicine

## 2019-02-10 VITALS — BP 118/82 | HR 72 | Ht 68.0 in | Wt 207.0 lb

## 2019-02-10 DIAGNOSIS — F419 Anxiety disorder, unspecified: Secondary | ICD-10-CM | POA: Diagnosis not present

## 2019-02-10 DIAGNOSIS — G8929 Other chronic pain: Secondary | ICD-10-CM

## 2019-02-10 DIAGNOSIS — M5137 Other intervertebral disc degeneration, lumbosacral region: Secondary | ICD-10-CM

## 2019-02-10 DIAGNOSIS — M25561 Pain in right knee: Secondary | ICD-10-CM

## 2019-02-10 DIAGNOSIS — Z0000 Encounter for general adult medical examination without abnormal findings: Secondary | ICD-10-CM | POA: Diagnosis not present

## 2019-02-10 DIAGNOSIS — F418 Other specified anxiety disorders: Secondary | ICD-10-CM | POA: Diagnosis not present

## 2019-02-10 DIAGNOSIS — M25562 Pain in left knee: Secondary | ICD-10-CM

## 2019-02-10 LAB — COMPREHENSIVE METABOLIC PANEL
ALT: 11 U/L (ref 0–35)
AST: 17 U/L (ref 0–37)
Albumin: 4.4 g/dL (ref 3.5–5.2)
Alkaline Phosphatase: 80 U/L (ref 39–117)
BUN: 19 mg/dL (ref 6–23)
CO2: 27 mEq/L (ref 19–32)
Calcium: 9.5 mg/dL (ref 8.4–10.5)
Chloride: 104 mEq/L (ref 96–112)
Creatinine, Ser: 0.71 mg/dL (ref 0.40–1.20)
GFR: 104.97 mL/min (ref >60.00)
Glucose, Bld: 73 mg/dL (ref 70–99)
POTASSIUM: 4.3 meq/L (ref 3.5–5.1)
SODIUM: 138 meq/L (ref 135–145)
Total Bilirubin: 0.5 mg/dL (ref 0.2–1.2)
Total Protein: 7.3 g/dL (ref 6.0–8.3)

## 2019-02-10 LAB — CBC WITH DIFFERENTIAL/PLATELET
Basophils Absolute: 0 10*3/uL (ref 0.0–0.1)
Basophils Relative: 0.7 % (ref 0.0–3.0)
Eosinophils Absolute: 0 10*3/uL (ref 0.0–0.7)
Eosinophils Relative: 1 % (ref 0.0–5.0)
HCT: 37.7 % (ref 36.0–46.0)
Hemoglobin: 12.4 g/dL (ref 12.0–15.0)
Lymphocytes Relative: 39.8 % (ref 12.0–46.0)
Lymphs Abs: 1.9 10*3/uL (ref 0.7–4.0)
MCHC: 32.8 g/dL (ref 30.0–36.0)
MCV: 90.6 fl (ref 78.0–100.0)
Monocytes Absolute: 0.5 10*3/uL (ref 0.1–1.0)
Monocytes Relative: 10.4 % (ref 3.0–12.0)
Neutro Abs: 2.3 10*3/uL (ref 1.4–7.7)
Neutrophils Relative %: 48.1 % (ref 43.0–77.0)
Platelets: 277 10*3/uL (ref 150.0–400.0)
RBC: 4.16 Mil/uL (ref 3.87–5.11)
RDW: 13 % (ref 11.5–15.5)
WBC: 4.8 10*3/uL (ref 4.0–10.5)

## 2019-02-10 LAB — LIPID PANEL
Cholesterol: 191 mg/dL (ref 0–200)
HDL: 63.5 mg/dL (ref >39.00)
LDL Cholesterol: 118 mg/dL — ABNORMAL HIGH (ref 0–99)
NONHDL: 127.7
Total CHOL/HDL Ratio: 3
Triglycerides: 51 mg/dL (ref 0.0–149.0)
VLDL: 10.2 mg/dL (ref 0.0–40.0)

## 2019-02-10 LAB — TSH: TSH: 1.85 u[IU]/mL (ref 0.35–4.50)

## 2019-02-10 MED ORDER — CLONAZEPAM 0.5 MG PO TABS: ORAL_TABLET | ORAL | 0 refills | Status: DC

## 2019-02-10 MED ORDER — DICLOFENAC SODIUM 1 % TD GEL: 4.0000 g | Freq: Four times a day (QID) | TRANSDERMAL | 2 refills | Status: DC

## 2019-02-10 MED ORDER — FLUOXETINE HCL 20 MG PO TABS: 20.0000 mg | ORAL_TABLET | Freq: Every day | ORAL | 3 refills | Status: DC

## 2019-02-10 NOTE — Patient Instructions (Signed)
Preventive Care 40-64 Years, Female Preventive care refers to lifestyle choices and visits with your health care provider that can promote health and wellness. What does preventive care include?   A yearly physical exam. This is also called an annual well check.  Dental exams once or twice a year.  Routine eye exams. Ask your health care provider how often you should have your eyes checked.  Personal lifestyle choices, including: ? Daily care of your teeth and gums. ? Regular physical activity. ? Eating a healthy diet. ? Avoiding tobacco and drug use. ? Limiting alcohol use. ? Practicing safe sex. ? Taking low-dose aspirin daily starting at age 50. ? Taking vitamin and mineral supplements as recommended by your health care provider. What happens during an annual well check? The services and screenings done by your health care provider during your annual well check will depend on your age, overall health, lifestyle risk factors, and family history of disease. Counseling Your health care provider may ask you questions about your:  Alcohol use.  Tobacco use.  Drug use.  Emotional well-being.  Home and relationship well-being.  Sexual activity.  Eating habits.  Work and work environment.  Method of birth control.  Menstrual cycle.  Pregnancy history. Screening You may have the following tests or measurements:  Height, weight, and BMI.  Blood pressure.  Lipid and cholesterol levels. These may be checked every 5 years, or more frequently if you are over 50 years old.  Skin check.  Lung cancer screening. You may have this screening every year starting at age 55 if you have a 30-pack-year history of smoking and currently smoke or have quit within the past 15 years.  Colorectal cancer screening. All adults should have this screening starting at age 50 and continuing until age 75. Your health care provider may recommend screening at age 45. You will have tests every  1-10 years, depending on your results and the type of screening test. People at increased risk should start screening at an earlier age. Screening tests may include: ? Guaiac-based fecal occult blood testing. ? Fecal immunochemical test (FIT). ? Stool DNA test. ? Virtual colonoscopy. ? Sigmoidoscopy. During this test, a flexible tube with a tiny camera (sigmoidoscope) is used to examine your rectum and lower colon. The sigmoidoscope is inserted through your anus into your rectum and lower colon. ? Colonoscopy. During this test, a long, thin, flexible tube with a tiny camera (colonoscope) is used to examine your entire colon and rectum.  Hepatitis C blood test.  Hepatitis B blood test.  Sexually transmitted disease (STD) testing.  Diabetes screening. This is done by checking your blood sugar (glucose) after you have not eaten for a while (fasting). You may have this done every 1-3 years.  Mammogram. This may be done every 1-2 years. Talk to your health care provider about when you should start having regular mammograms. This may depend on whether you have a family history of breast cancer.  BRCA-related cancer screening. This may be done if you have a family history of breast, ovarian, tubal, or peritoneal cancers.  Pelvic exam and Pap test. This may be done every 3 years starting at age 21. Starting at age 30, this may be done every 5 years if you have a Pap test in combination with an HPV test.  Bone density scan. This is done to screen for osteoporosis. You may have this scan if you are at high risk for osteoporosis. Discuss your test results, treatment options,   and if necessary, the need for more tests with your health care provider. Vaccines Your health care provider may recommend certain vaccines, such as:  Influenza vaccine. This is recommended every year.  Tetanus, diphtheria, and acellular pertussis (Tdap, Td) vaccine. You may need a Td booster every 10 years.  Varicella  vaccine. You may need this if you have not been vaccinated.  Zoster vaccine. You may need this after age 15.  Measles, mumps, and rubella (MMR) vaccine. You may need at least one dose of MMR if you were born in 1957 or later. You may also need a second dose.  Pneumococcal 13-valent conjugate (PCV13) vaccine. You may need this if you have certain conditions and were not previously vaccinated.  Pneumococcal polysaccharide (PPSV23) vaccine. You may need one or two doses if you smoke cigarettes or if you have certain conditions.  Meningococcal vaccine. You may need this if you have certain conditions.  Hepatitis A vaccine. You may need this if you have certain conditions or if you travel or work in places where you may be exposed to hepatitis A.  Hepatitis B vaccine. You may need this if you have certain conditions or if you travel or work in places where you may be exposed to hepatitis B.  Haemophilus influenzae type b (Hib) vaccine. You may need this if you have certain conditions. Talk to your health care provider about which screenings and vaccines you need and how often you need them. This information is not intended to replace advice given to you by your health care provider. Make sure you discuss any questions you have with your health care provider. Document Released: 12/21/2015 Document Revised: 01/14/2018 Document Reviewed: 09/25/2015 Elsevier Interactive Patient Education  2019 [employer].

## 2019-02-10 NOTE — Progress Notes (Signed)
Subjective:     Suzanne Singh is a 53 y.o. female and is here for a comprehensive physical exam. The patient reports problems - b/l knee pain.  No known injury  Social History   Socioeconomic History  . Marital status: Married    Spouse name: Not on file  . Number of children: 2  . Years of education: Not on file  . Highest education level: Not on file  Occupational History  . Not on file  Social Needs  . Financial resource strain: Not on file  . Food insecurity:    Worry: Not on file    Inability: Not on file  . Transportation needs:    Medical: Not on file    Non-medical: Not on file  Tobacco Use  . Smoking status: Never Smoker  . Smokeless tobacco: Never Used  Substance and Sexual Activity  . Alcohol use: No    Alcohol/week: 0.0 standard drinks    Comment: wine on occasion  . Drug use: No  . Sexual activity: Not on file  Lifestyle  . Physical activity:    Days per week: Not on file    Minutes per session: Not on file  . Stress: Not on file  Relationships  . Social connections:    Talks on phone: Not on file    Gets together: Not on file    Attends religious service: Not on file    Active member of club or organization: Not on file    Attends meetings of clubs or organizations: Not on file    Relationship status: Not on file  . Intimate partner violence:    Fear of current or ex partner: Not on file    Emotionally abused: Not on file    Physically abused: Not on file    Forced sexual activity: Not on file  Other Topics Concern  . 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   2 sons      Exercise-no   Health Maintenance  Topic Date Due  . MAMMOGRAM  07/25/2016  . PAP SMEAR-Modifier  07/10/2018  . HIV Screening  12/23/2023 (Originally 01/03/1983)  . COLONOSCOPY  02/03/2023  . TETANUS/TDAP  12/23/2027  . INFLUENZA VACCINE  Completed     The following portions of the patient's history were reviewed and updated as appropriate:  She  has a past medical history of Arrhythmia, Arthritis, Depression, Eating disorder, Fibromyalgia, GERD (gastroesophageal reflux disease), IBS (irritable bowel syndrome), Migraine, and Seizures (HCC). She does not have any pertinent problems on file. She  has no past surgical history on file. Her family history includes Alcohol abuse in her father and mother; Arthritis in her maternal grandmother and mother; Breast cancer in her maternal grandmother; Colon cancer in her father and paternal grandfather; Diabetes in her father and paternal grandfather; Early death in her sister; Heart disease in her father, paternal grandfather, and paternal grandmother; Hypertension in her father, maternal grandfather, maternal grandmother, mother, paternal grandfather, and paternal grandmother; Kidney disease in her paternal grandfather; Lung cancer in her mother; Mental illness in her mother; Stroke in her maternal grandfather. She  reports that she has never smoked. She has never used smokeless tobacco. She reports that she does not drink alcohol or use drugs. She has a current medication list which includes the following prescription(s): albuterol, clonazepam, cyclobenzaprine, fluticasone, tramadol, diclofenac sodium, and fluoxetine, and the following  Facility-Administered Medications: lidocaine (pf). Current Outpatient Medications on File Prior to Visit  Medication Sig Dispense Refill  . albuterol (PROAIR HFA) 108 (90 Base) MCG/ACT inhaler Inhale 2 puffs into the lungs every 6 (six) hours as needed for wheezing or shortness of breath. 18 g 3  . cyclobenzaprine (FLEXERIL) 10 MG tablet Take 1 tablet (10 mg total) by mouth at bedtime. 15 tablet 0  . fluticasone (FLONASE) 50 MCG/ACT nasal spray Place 2 sprays into both nostrils daily. 16 g 6  . traMADol (ULTRAM) 50 MG tablet Take 1 tablet (50 mg total) by mouth every 6  (six) hours as needed. 10 tablet 0   Current Facility-Administered Medications on File Prior to Visit  Medication Dose Route Frequency Provider Last Rate Last Dose  . lidocaine (PF) (XYLOCAINE) 1 % injection 2 mL  2 mL Other Once Tyrell Antonio, MD       She is allergic to [place] cr [zolpidem tartrate er]; cymbalta [duloxetine hcl]; celebrex [celecoxib]; hydrocodone; and latex..  Review of Systems Review of Systems  Constitutional: Negative for activity change, appetite change and fatigue.  HENT: Negative for hearing loss, congestion, tinnitus and ear discharge.  dentist q68m Eyes: Negative for visual disturbance (see optho q1y -- vision corrected to 20/20 with glasses).  Respiratory: Negative for cough, chest tightness and shortness of breath.   Cardiovascular: Negative for chest pain, palpitations and leg swelling.  Gastrointestinal: Negative for abdominal pain, diarrhea, constipation and abdominal distention.  Genitourinary: Negative for urgency, frequency, decreased urine volume and difficulty urinating.  Musculoskeletal: Negative for back pain, arthralgias and gait problem.  Skin: Negative for color change, pallor and rash.  Neurological: Negative for dizziness, light-headedness, numbness and headaches.  Hematological: Negative for adenopathy. Does not bruise/bleed easily.  Psychiatric/Behavioral: Negative for suicidal ideas, confusion, sleep disturbance, self-injury, dysphoric mood, decreased concentration and agitation.       Objective:    BP 118/82   Pulse 72   Ht 5\' 8"  (1.727 m)   Wt 207 lb (93.9 kg)   SpO2 98%   BMI 31.47 kg/m  General appearance: alert, cooperative, appears stated age and no distress Head: Normocephalic, without obvious abnormality, atraumatic Eyes: conjunctivae/corneas clear. PERRL, EOM's intact. Fundi benign. Ears: normal TM's and external ear canals both ears Nose: Nares normal. Septum midline. Mucosa normal. No drainage or sinus  tenderness. Throat: lips, mucosa, and tongue normal; teeth and gums normal Neck: no adenopathy, no carotid bruit, no JVD, supple, symmetrical, trachea midline and thyroid not enlarged, symmetric, no tenderness/mass/nodules Back: symmetric, no curvature. ROM normal. No CVA tenderness. Lungs: clear to auscultation bilaterally Breasts: gyn Heart: regular rate and rhythm, S1, S2 normal, no murmur, click, rub or gallop Abdomen: soft, non-tender; bowel sounds normal; no masses,  no organomegaly Pelvic: deferred--gyn Extremities: extremities normal, atraumatic, no cyanosis or edema Pulses: 2+ and symmetric Skin: Skin color, texture, turgor normal. No rashes or lesions Lymph nodes: Cervical, supraclavicular, and axillary nodes normal. Neurologic: Alert and oriented X 3, normal strength and tone. Normal symmetric reflexes. Normal coordination and gait    Assessment:    Healthy female exam.      Plan:    ghm utd Check labs  See After Visit Summary for Counseling Recommendations    1. Chronic pain of both knees  - Ambulatory referral to Orthopedic Surgery - diclofenac sodium (VOLTAREN) 1 % GEL; Apply 4 g topically 4 (four) times daily.  Dispense: 100 g; Refill: 2 - Ambulatory referral to Physical Therapy  2. Preventative health care  See above - Lipid panel - CBC with Differential/Platelet - Comprehensive metabolic panel - TSH  3. Anxiety stable - clonazePAM (KLONOPIN) 0.5 MG tablet; TAKE 1 TABLET(0.5 MG) BY MOUTH TWICE DAILY AS NEEDED FOR ANXIETY  Dispense: 30 tablet; Refill: 0 - Pain Mgmt, Profile 8 w/Conf, U  4. Depression with anxiety stable - FLUoxetine (PROZAC) 20 MG tablet; Take 1 tablet (20 mg total) by mouth daily.  Dispense: 30 tablet; Refill: 3  5. DDD (degenerative disc disease), lumbosacral  - Ambulatory referral to Physical Therapy

## 2019-02-11 ENCOUNTER — Telehealth: Payer: Self-pay

## 2019-02-11 NOTE — Telephone Encounter (Signed)
PA approved. Effective 02/11/2019 to 02/10/2022.

## 2019-02-11 NOTE — Telephone Encounter (Signed)
PA initiated via Covermymeds; KEY: [id number]. Awaiting determination.

## 2019-02-14 ENCOUNTER — Telehealth: Payer: Self-pay | Admitting: *Deleted

## 2019-02-14 LAB — PAIN MGMT, PROFILE 8 W/CONF, U
6 Acetylmorphine: NEGATIVE ng/mL (ref <10)
ALCOHOL METABOLITES: POSITIVE ng/mL — AB (ref <500)
Amphetamines: NEGATIVE ng/mL (ref <500)
BENZODIAZEPINES: NEGATIVE ng/mL (ref <100)
Buprenorphine, Urine: NEGATIVE ng/mL (ref <5)
CREATININE: 169.4 mg/dL
Cocaine Metabolite: NEGATIVE ng/mL (ref <150)
ETHYL SULFATE (ETS): 415 ng/mL — AB (ref <100)
Ethyl Glucuronide (ETG): 1208 ng/mL — ABNORMAL HIGH (ref <500)
MDMA: NEGATIVE ng/mL (ref <500)
Marijuana Metabolite: NEGATIVE ng/mL (ref <20)
Opiates: NEGATIVE ng/mL (ref <100)
Oxidant: NEGATIVE ug/mL (ref <200)
Oxycodone: NEGATIVE ng/mL (ref <100)
PH: 7.06 (ref 4.5–9.0)

## 2019-02-14 NOTE — Telephone Encounter (Signed)
Copied from CRM [id number]. Topic: General - Other >> Feb 14, 2019 11:15 AM Marylen Ponto wrote: Reason for CRM: Megan with [employer] stated they need the patient insurance information. Aundra Millet stated they received some insurance information but it was for another patient. Fax# [phone]

## 2019-02-15 ENCOUNTER — Encounter: Payer: Self-pay | Admitting: *Deleted

## 2019-02-18 ENCOUNTER — Encounter: Payer: Self-pay | Admitting: Family Medicine

## 2019-03-03 ENCOUNTER — Other Ambulatory Visit: Payer: Self-pay | Admitting: Family Medicine

## 2019-03-07 ENCOUNTER — Encounter (INDEPENDENT_AMBULATORY_CARE_PROVIDER_SITE_OTHER): Payer: Self-pay | Admitting: Specialist

## 2019-03-07 ENCOUNTER — Ambulatory Visit (INDEPENDENT_AMBULATORY_CARE_PROVIDER_SITE_OTHER): Payer: Self-pay

## 2019-03-07 ENCOUNTER — Ambulatory Visit (INDEPENDENT_AMBULATORY_CARE_PROVIDER_SITE_OTHER): Payer: BC Managed Care – PPO | Admitting: Specialist

## 2019-03-07 ENCOUNTER — Other Ambulatory Visit: Payer: Self-pay

## 2019-03-07 VITALS — BP 120/82 | HR 78 | Ht 68.0 in | Wt 207.0 lb

## 2019-03-07 DIAGNOSIS — S336XXD Sprain of sacroiliac joint, subsequent encounter: Secondary | ICD-10-CM | POA: Diagnosis not present

## 2019-03-07 DIAGNOSIS — M4726 Other spondylosis with radiculopathy, lumbar region: Secondary | ICD-10-CM | POA: Diagnosis not present

## 2019-03-07 DIAGNOSIS — M25561 Pain in right knee: Secondary | ICD-10-CM

## 2019-03-07 DIAGNOSIS — M170 Bilateral primary osteoarthritis of knee: Secondary | ICD-10-CM | POA: Diagnosis not present

## 2019-03-07 DIAGNOSIS — Z905 Acquired absence of kidney: Secondary | ICD-10-CM

## 2019-03-07 DIAGNOSIS — M25562 Pain in left knee: Secondary | ICD-10-CM

## 2019-03-07 DIAGNOSIS — K219 Gastro-esophageal reflux disease without esophagitis: Secondary | ICD-10-CM

## 2019-03-07 MED ORDER — IBUPROFEN-FAMOTIDINE 800-26.6 MG PO TABS: ORAL_TABLET | ORAL | 3 refills | Status: DC

## 2019-03-07 MED ORDER — CYCLOBENZAPRINE HCL 5 MG PO TABS: 5.0000 mg | ORAL_TABLET | Freq: Three times a day (TID) | ORAL | 0 refills | Status: DC | PRN

## 2019-03-07 NOTE — Addendum Note (Signed)
Addended by: Penne Lash, Otis Dials on: 03/07/2019 04:08 PM   Modules accepted: Orders

## 2019-03-07 NOTE — Progress Notes (Signed)
Office Visit Note   Patient: Suzanne Singh           Date of Birth: Apr 18, 1966           MRN: [id number] Visit Date: 03/07/2019              Requested by: [address] [place], [place] [address] [place], [place] [address] PCP: Carollee Herter, Alferd Apa, DO   Assessment & Plan: Visit Diagnoses:  1. Pain in both knees, unspecified chronicity   2. Osteoarthritis of patellofemoral joints of both knees   3. Other spondylosis with radiculopathy, lumbar region   4. Sprain of sacroiliac ligament, subsequent encounter   5. Single kidney   6. Gastroesophageal reflux disease without esophagitis     Plan: Avoid frequent bending and stooping  No lifting greater than 10 lbs. May use ice or moist heat for pain. Weight loss is of benefit. Best medication for lumbar disc disease is arthritis medications like motrin, celebrex and naprosyn. Exercise is important to improve your indurance and does allow people to function better inspite of back pain.   Knee is suffering from osteoarthritis, only real proven treatments are Weight loss, NSIADs like duexis and exercise. Well padded shoes help. Ice the knee 2-3 times a day 15-20 mins at a time. Hemp CBD Oil capsules 5,000-7,000 mg  Per bottle  60 capsules per bottle, [email or web address] Flexeril 5 mg po up to TID for generalized pain. Follow-Up Instructions: Return in about 4 weeks (around 04/04/2019).   Orders:  Orders Placed This Encounter  Procedures  . XR KNEE 3 VIEW LEFT  . XR KNEE 3 VIEW RIGHT  . Uric acid  . C-reactive protein  . Sed Rate (ESR)   Meds ordered this encounter  Medications  . Ibuprofen-Famotidine 800-26.6 MG TABS    Sig: Take one tablet po up to three times per day for joint pain.    Dispense:  90 tablet    Refill:  3  . cyclobenzaprine (FLEXERIL) 5 MG tablet    Sig: Take 1 tablet (5 mg total) by mouth 3 (three) times daily as needed for muscle spasms.    Dispense:  90 tablet    Refill:  0      Procedures: No  procedures performed   Clinical Data: No additional findings.   Subjective: Chief Complaint  Patient presents with  . Left Hip - Follow-up  . Left Knee - Pain  . Right Knee - Pain    53 year old female with history of bilateral knee pain and left hip pain underwent left hip radiographs. She did some mopping upstairs and some work and today the knees are painful today. The knees are painful and are better now that school is out for virus mitigation. She is having increased left knee greater than right knee pain. She is having sharp pain with twisting or turning that is sharp. She has pain that is similar to that of her left hip.. In the AM there is knee stiffness, she trying stretching, I feel like I'm 83, in the past did track and athletics. With increased physical activity she has worsening pain. Take meloxicam but felt her heart racing so she stopped and now has restarted to decrease knee pain and is using voltaren gel. Pain with squatting, stairclimbing and kneeling are painful. 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    Review of Systems  Constitutional: Positive for activity change and unexpected weight  change. Negative for appetite change, chills, diaphoresis, fatigue and fever.  HENT: Negative.  Negative for congestion, dental problem, drooling, ear discharge, ear pain, facial swelling, hearing loss, mouth sores, nosebleeds, postnasal drip, rhinorrhea, sinus pressure, sinus pain, sneezing, sore throat, tinnitus, trouble swallowing and voice change.   Eyes: Negative.  Negative for photophobia, pain, discharge, redness, itching and visual disturbance.  Respiratory: Negative.  Negative for apnea, cough, choking, chest tightness, shortness of breath, wheezing and stridor.   Cardiovascular: Positive for palpitations. Negative for chest pain and leg swelling.  Gastrointestinal: Negative.  Negative for abdominal distention, abdominal pain, anal bleeding, blood  in stool, constipation, diarrhea and nausea.  Endocrine: Negative.   Genitourinary: Negative.   Musculoskeletal: Negative.   Skin: Negative.   Allergic/Immunologic: Negative.   Neurological: Negative.   Hematological: Negative.   Psychiatric/Behavioral: Negative.      Objective: Vital Signs: BP 120/82 (BP Location: Left Arm, Patient Position: Sitting)   Pulse 78   Ht _0  (1.727 m)   Wt 207 lb (93.9 kg)   BMI 31.47 kg/m   Physical Exam Constitutional:      Appearance: She is well-developed.  HENT:     Head: Normocephalic and atraumatic.  Eyes:     Pupils: Pupils are equal, round, and reactive to light.  Neck:     Musculoskeletal: Normal range of motion and neck supple.  Pulmonary:     Effort: Pulmonary effort is normal.     Breath sounds: Normal breath sounds.  Abdominal:     General: Bowel sounds are normal.     Palpations: Abdomen is soft.  Musculoskeletal:     Right knee: She exhibits no effusion.     Left knee: She exhibits no effusion.  Skin:    General: Skin is warm and dry.  Neurological:     Mental Status: She is alert and oriented to person, place, and time.  Psychiatric:        Behavior: Behavior normal.        Thought Content: Thought content normal.        Judgment: Judgment normal.     Right Knee Exam   Tenderness  The patient is experiencing no tenderness.   Range of Motion  Extension: normal  Flexion: 130   Tests  McMurray:  Medial - negative Lateral - negative Varus: negative Valgus: negative Lachman:  Anterior - negative    Posterior - negative Drawer:  Anterior - negative    Posterior - negative Pivot shift: negative Patellar apprehension: negative  Other  Erythema: absent Scars: absent Sensation: normal Pulse: present Swelling: none Effusion: no effusion present   Left Knee Exam   Tenderness  The patient is experiencing tenderness in the patella.  Range of Motion  Extension: normal  Flexion: 130   Tests   Mc[place]ray:  Medial - negative Lateral - negative Varus: negative Valgus: negative Lachman:  Anterior - negative    Posterior - negative Drawer:  Anterior - negative     Posterior - negative Pivot shift: negative Patellar apprehension: negative  Other  Erythema: absent Scars: absent Sensation: normal Pulse: present Swelling: none Effusion: no effusion present   Left Hip Exam  Left hip exam is normal.  Tenderness  The patient is experiencing tenderness in the posterior.  Muscle Strength  Abduction: 5/5  Adduction: 5/5  Flexion: 5/5   Tests  FABER: positive  Other  Erythema: absent Scars: absent Sensation: normal Pulse: present   Back Exam   Tenderness  The patient  is experiencing tenderness in the lumbar.  Range of Motion  Extension: normal  Flexion:  80 abnormal  Lateral bend right: normal  Lateral bend left: normal  Rotation right: normal  Rotation left: normal   Muscle Strength  Right Quadriceps:  5/5  Left Quadriceps:  5/5  Right Hamstrings:  5/5  Left Hamstrings:  5/5   Tests  Straight leg raise right: negative Straight leg raise left: negative  Reflexes  Patellar: 1/4 Achilles: 1/4 Babinski's sign: normal   Other  Toe walk: normal Heel walk: normal Sensation: normal Gait: normal  Erythema: no back redness Scars: absent      Specialty Comments:  No specialty comments available.  Imaging: Xr Knee 3 View Left  Result Date: 03/07/2019 AP standing and lateral radiographs show minimal superior pole patella osteophyte with subtle retropatella irregular joint line. Minimal medial and lateral joint line narrowing.     PMFS History: Patient Active Problem List   Diagnosis Date Noted  . Neck pain 03/31/2018  . Viral URI with cough 04/25/2017  . Abscess 12/27/2016  . Left anterior shoulder pain 06/28/2015  . Skin infection 01/12/2015  . Breast pain 01/12/2015  . UTI (urinary tract infection) 11/16/2014  . Weight gain 11/16/2014   . Bilateral knee pain 09/21/2014  . Thyromegaly 09/21/2014  . Obesity (BMI 30-39.9) 06/21/2014  . Edema 04/07/2014  . Maxillary sinusitis 11/07/2013  . Elevated BP 11/07/2013  . Insomnia 07/28/2013  . Epigastric pain 12/06/2012  . Hot flushes, perimenopausal 12/06/2012  . IBS (irritable bowel syndrome) 12/06/2012  . Fatigue 12/06/2012  . Family hx of colon cancer in grandparent 12/06/2012  . Screening for malignant neoplasm of the cervix 07/22/2012  . Routine general medical examination at a health care facility 07/22/2012  . Anxiety and depression 07/22/2012  . Eating disorder 07/22/2012  . GERD (gastroesophageal reflux disease) 07/22/2012  . Fibromyalgia 07/22/2012   Past Medical History:  Diagnosis Date  . Arrhythmia   . Arthritis   . Depression   . Eating disorder   . Fibromyalgia   . GERD (gastroesophageal reflux disease)   . IBS (irritable bowel syndrome)   . Migraine   . Seizures ([place])     Family History  Problem Relation Age of Onset  . Early death Sister   . Alcohol abuse Mother   . Arthritis Mother   . Lung cancer Mother   . Hypertension Mother   . Mental illness Mother   . Alcohol abuse Father   . Heart disease Father   . Hypertension Father   . Diabetes Father   . Colon cancer Father   . Arthritis Maternal Grandmother   . Hypertension Maternal Grandmother   . Breast cancer Maternal Grandmother   . Stroke Maternal Grandfather   . Hypertension Maternal Grandfather   . Heart disease Paternal Grandmother   . Hypertension Paternal Grandmother   . Colon cancer Paternal Grandfather   . Heart disease Paternal Grandfather   . Hypertension Paternal Grandfather   . Kidney disease Paternal Grandfather   . Diabetes Paternal Grandfather     History reviewed. No pertinent surgical history. Social History   Occupational History  . Not on file  Tobacco Use  . Smoking status: Never Smoker  . Smokeless tobacco: Never Used  Substance and Sexual Activity  .  Alcohol use: No    Alcohol/week: 0.0 standard drinks    Comment: wine on occasion  . Drug use: No  . Sexual activity: Not on file

## 2019-03-07 NOTE — Patient Instructions (Addendum)
Plan: Avoid frequent bending and stooping  No lifting greater than 10 lbs. May use ice or moist heat for pain. Weight loss is of benefit. Best medication for lumbar disc disease is arthritis medications like motrin, celebrex and naprosyn. Exercise is important to improve your indurance and does allow people to function better inspite of back pain.   Knee is suffering from osteoarthritis, only real proven treatments are Weight loss, NSIADs like duexis and exercise. Well padded shoes help. Ice the knee 2-3 times a day 15-20 mins at a time. Hemp CBD Oil capsules 5,000-7,000 mg  Per bottle  60 capsules per bottle, [email or web address] Flexeril 5 mg po up to TID for generalized pain. Sed rate, uric acid, CRP.

## 2019-03-08 LAB — SEDIMENTATION RATE: Sed Rate: 19 mm/h (ref 0–30)

## 2019-03-08 LAB — URIC ACID: URIC ACID, SERUM: 2.1 mg/dL — AB (ref 2.5–7.0)

## 2019-03-08 LAB — C-REACTIVE PROTEIN: CRP: 1.8 mg/L (ref <8.0)

## 2019-03-17 ENCOUNTER — Ambulatory Visit (INDEPENDENT_AMBULATORY_CARE_PROVIDER_SITE_OTHER): Payer: BC Managed Care – PPO | Admitting: Specialist

## 2019-04-12 ENCOUNTER — Other Ambulatory Visit: Payer: Self-pay

## 2019-04-12 ENCOUNTER — Encounter: Payer: Self-pay | Admitting: *Deleted

## 2019-04-12 ENCOUNTER — Ambulatory Visit (INDEPENDENT_AMBULATORY_CARE_PROVIDER_SITE_OTHER): Payer: BC Managed Care – PPO | Admitting: *Deleted

## 2019-04-12 DIAGNOSIS — Z23 Encounter for immunization: Secondary | ICD-10-CM | POA: Diagnosis not present

## 2019-04-12 NOTE — Patient Instructions (Signed)
After obtaining consent, and per orders of Dr. Zola Button, injection of Shingrix given by [facility]. Patient tolerated well and instructed to report any adverse reaction to [place] immediately/SLS 05/06

## 2019-05-10 ENCOUNTER — Other Ambulatory Visit: Payer: Self-pay

## 2019-05-10 ENCOUNTER — Encounter: Payer: Self-pay | Admitting: Family Medicine

## 2019-05-10 ENCOUNTER — Ambulatory Visit (INDEPENDENT_AMBULATORY_CARE_PROVIDER_SITE_OTHER): Payer: BC Managed Care – PPO | Admitting: Family Medicine

## 2019-05-10 DIAGNOSIS — F419 Anxiety disorder, unspecified: Secondary | ICD-10-CM | POA: Diagnosis not present

## 2019-05-10 MED ORDER — CLONAZEPAM 0.5 MG PO TABS: ORAL_TABLET | ORAL | 0 refills | Status: DC

## 2019-05-10 MED ORDER — FLUOXETINE HCL 10 MG PO CAPS: ORAL_CAPSULE | ORAL | 3 refills | Status: DC
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
## 2019-06-07 ENCOUNTER — Ambulatory Visit: Payer: BC Managed Care – PPO | Admitting: Family Medicine

## 2019-06-14 ENCOUNTER — Ambulatory Visit: Payer: BC Managed Care – PPO | Admitting: Family Medicine

## 2019-07-14 ENCOUNTER — Telehealth: Payer: Self-pay | Admitting: Specialist

## 2019-07-14 NOTE — Telephone Encounter (Signed)
Pt called in requesting a doctor's note stating that she needs to work from home due to her serve arthritidis pain in her back,hip and knee's. ----Is this ok?

## 2019-07-14 NOTE — Telephone Encounter (Signed)
Pt called in requesting a doctor's note stating that she needs to work from home due to her serve arthritidis pain in her back,hip and knee's.   [phone]

## 2019-07-15 ENCOUNTER — Ambulatory Visit (INDEPENDENT_AMBULATORY_CARE_PROVIDER_SITE_OTHER): Payer: BC Managed Care – PPO | Admitting: Family Medicine

## 2019-07-15 ENCOUNTER — Other Ambulatory Visit: Payer: Self-pay

## 2019-07-15 ENCOUNTER — Encounter: Payer: Self-pay | Admitting: Family Medicine

## 2019-07-15 DIAGNOSIS — F419 Anxiety disorder, unspecified: Secondary | ICD-10-CM | POA: Diagnosis not present

## 2019-07-15 MED ORDER — FLUOXETINE HCL 20 MG PO TABS: 20.0000 mg | ORAL_TABLET | Freq: Every day | ORAL | 3 refills | Status: DC

## 2019-07-15 NOTE — Progress Notes (Signed)
Virtual Visit via Video Note  I connected with [employer] on 07/16/19 at  3:40 PM EDT by a video enabled telemedicine application and verified that I am speaking with the correct person using two identifiers.  Location: Patient: home Provider: office    I discussed the limitations of evaluation and management by telemedicine and the availability of in person appointments. The patient expressed understanding and agreed to proceed.  History of Present Illness: Pt is home and c/o that she would like to stay on 20 mg of prozac.  She does not like the way 30 mg makes her feel.    Observations/Objective: No vitals obtained Pt in NAD Assessment and Plan: 1. Anxiety She would like to stay on 20 mg ---  With the 30 mg she did not like the way she felt.   - FLUoxetine (PROZAC) 20 MG tablet; Take 1 tablet (20 mg total) by mouth daily.  Dispense: 90 tablet; Refill: 3  Follow Up Instructions:    I discussed the assessment and treatment plan with the patient. The patient was provided an opportunity to ask questions and all were answered. The patient agreed with the plan and demonstrated an understanding of the instructions.   The patient was advised to call back or seek an in-person evaluation if the symptoms worsen or if the condition fails to improve as anticipated.  I provided 15 minutes of non-face-to-face time during this encounter.   Ann Held, DO

## 2019-07-16 ENCOUNTER — Encounter: Payer: Self-pay | Admitting: Family Medicine

## 2019-11-01 ENCOUNTER — Telehealth: Payer: Self-pay | Admitting: Family Medicine

## 2019-11-01 ENCOUNTER — Other Ambulatory Visit: Payer: Self-pay | Admitting: Family Medicine

## 2019-11-01 MED ORDER — MELOXICAM 7.5 MG PO TABS: ORAL_TABLET | ORAL | 2 refills | Status: DC

## 2019-11-01 NOTE — Telephone Encounter (Signed)
Copied from [place] [id number]. Topic: General - Other >> Nov 01, 2019  9:31 AM Keene Breath wrote: Reason for CRM: Patient called to ask the doctor if she could go back on her previous medication, Meloxcam, because the current medication is not working.  Please advise and call to discuss at [phone]

## 2019-11-01 NOTE — Telephone Encounter (Signed)
Pt states currently taking Duesis and states not helping. Pt would like to switch back to Meloxicam.

## 2019-11-01 NOTE — Telephone Encounter (Signed)
I sent mobic in

## 2020-03-02 ENCOUNTER — Other Ambulatory Visit: Payer: Self-pay

## 2020-03-02 ENCOUNTER — Ambulatory Visit (INDEPENDENT_AMBULATORY_CARE_PROVIDER_SITE_OTHER): Payer: BC Managed Care – PPO | Admitting: Family Medicine

## 2020-03-02 ENCOUNTER — Encounter: Payer: Self-pay | Admitting: Family Medicine

## 2020-03-02 DIAGNOSIS — F419 Anxiety disorder, unspecified: Secondary | ICD-10-CM

## 2020-03-02 MED ORDER — CLONAZEPAM 0.5 MG PO TABS: ORAL_TABLET | ORAL | 0 refills | Status: DC

## 2020-03-02 MED ORDER — FLUOXETINE HCL 40 MG PO CAPS: 40.0000 mg | ORAL_CAPSULE | Freq: Every day | ORAL | 3 refills | Status: DC

## 2020-03-02 NOTE — Progress Notes (Signed)
Virtual Visit via Video Note  I connected with [name] on 03/02/20 at  2:40 PM EDT by a video enabled telemedicine application and verified that I am speaking with the correct person using two identifiers.  Location: Patient: home alone  Provider: office    I discussed the limitations of evaluation and management by telemedicine and the availability of in person appointments. The patient expressed understanding and agreed to proceed.  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   She would like to stay home    Observations/Objective: There were no vitals filed for this visit. Pt is anxious  She is not suicidal   Assessment and Plan: 1. Anxiety Not for work in my [employer] prozac to 40 mg  F/u 1 month Refill klonopin - clonazePAM (KLONOPIN) 0.5 MG tablet; TAKE 1 TABLET(0.5 MG) BY MOUTH TWICE DAILY AS NEEDED FOR ANXIETY  Dispense: 30 tablet; Refill: 0 - FLUoxetine (PROZAC) 40 MG capsule; Take 1 capsule (40 mg total) by mouth daily.  Dispense: 90 capsule; Refill: 3  Follow Up Instructions:    I discussed the assessment and treatment plan with the patient. The patient was provided an opportunity to ask questions and all were answered. The patient agreed with the plan and demonstrated an understanding of the instructions.   The patient was advised to call back or seek an in-person evaluation if the symptoms worsen or if the condition fails to improve as anticipated.  I provided 30 minutes of non-face-to-face time during this encounter.   Donato Schultz, DO

## 2020-05-04 ENCOUNTER — Other Ambulatory Visit: Payer: Self-pay

## 2020-05-04 DIAGNOSIS — F419 Anxiety disorder, unspecified: Secondary | ICD-10-CM

## 2020-05-04 MED ORDER — CLONAZEPAM 0.5 MG PO TABS: ORAL_TABLET | ORAL | 0 refills | Status: DC

## 2020-05-04 NOTE — Telephone Encounter (Signed)
Requesting: Clonazepam 0.5mg  Contract: none UDS: 02/10/2019 Last Visit: 03/02/2020 Next Visit: none scheduled Last Refill:03/02/2020  Please Advise

## 2020-05-09 ENCOUNTER — Other Ambulatory Visit: Payer: Self-pay

## 2020-05-09 ENCOUNTER — Telehealth (INDEPENDENT_AMBULATORY_CARE_PROVIDER_SITE_OTHER): Payer: BC Managed Care – PPO | Admitting: Family Medicine

## 2020-05-09 ENCOUNTER — Telehealth: Payer: Self-pay

## 2020-05-09 ENCOUNTER — Encounter: Payer: Self-pay | Admitting: Family Medicine

## 2020-05-09 VITALS — Temp 99.3°F | Ht 68.0 in | Wt 213.6 lb

## 2020-05-09 DIAGNOSIS — T50Z95A Adverse effect of other vaccines and biological substances, initial encounter: Secondary | ICD-10-CM

## 2020-05-09 NOTE — Telephone Encounter (Signed)
Yes---  we can not do that --- she can schedule app at [facility] or [facility] for a test----  once neg  We can schedule ov / labs

## 2020-05-09 NOTE — Progress Notes (Signed)
Virtual Visit via Video Note  I connected with [name] on 05/09/20 at  9:00 AM EDT by a video enabled telemedicine application and verified that I am speaking with the correct person using two identifiers.  Location: Patient: home alone  Provider: home    I discussed the limitations of evaluation and management by telemedicine and the availability of in person appointments. The patient expressed understanding and agreed to proceed.  History of Present Illness: Pt is home alone --she had her covid shot (moderna) on 5/19 and start having swelling, redness and pain, burning in L arm and the next day it got progressively worse   She then developed fatigue , hot sweats  Fevers (101.4 ) bodyaches , chills and joint pains.  She also has swelling in her joints and ringing in the ears and headaches.   She also states her cycles have stopped since the shot.    she will call the pharmacy she had the vaccine at and let them know as well She is taking antihistamine and tylenol Observations/Objective: Vitals:   05/09/20 0851  Temp: 99.3 F (37.4 C)   pt is fatigued and having hot flashes  L arm still sore but improved   Assessment and Plan: 1. Adverse effect of vaccine, initial encounter Reaction to first moderna vaccine ----- con't with otc antihistamine con't tylenol and cool compresses  Will need to contact moderna  Pt needs in office visit  Hold off on 2nd shot for now   Follow Up Instructions:    I discussed the assessment and treatment plan with the patient. The patient was provided an opportunity to ask questions and all were answered. The patient agreed with the plan and demonstrated an understanding of the instructions.   The patient was advised to call back or seek an in-person evaluation if the symptoms worsen or if the condition fails to improve as anticipated.  I provided 25 minutes of non-face-to-face time during this encounter.   Donato Schultz, DO

## 2020-05-09 NOTE — Telephone Encounter (Signed)
Per PCP, reported adverse reaction of Moderna Covid Vaccine.   Submitted VAERS (Vaccine Adverse Event Reporting System) report online. Temporary VAERS E-Report number: [id number].   Reported adverse reaction to [place], [employer] [phone]).  Spoke to [place], Charity fundraiser. Case # [id number] VAERS report to be sent to safety team.   Spoke with [facility], [address], [place] to inform of reaction.    Gala Murdoch provides statistics of reactions and side effects, however does not advise providers on treatment or if patient should receive second dose. This is determined by patient and PCP.

## 2020-05-09 NOTE — Telephone Encounter (Signed)
Spoke with patient. Pt states there was discussion about blood work and she wants to check a COVID antibody test. Pt states she is feeling really bad and wants to know if she should be tested for COVID? Please advise

## 2020-05-09 NOTE — Telephone Encounter (Signed)
Could send the case to Dr Drue Second and see what she recommends.

## 2020-05-09 NOTE — Telephone Encounter (Signed)
Would you let the pt know we have reported it to the company and I am waitng for one of our inf disease specialists to get back to me.   Thanks

## 2020-05-09 NOTE — Telephone Encounter (Signed)
Is there someone through [facility] we can ask-?--  maybe someone who was involved in the vaccine clinic through [facility] to see if they saw this reaction at all -?---- I told the pt to hold off on the second one because her reaction was so severe and has still not resolved---- its been 2 weeks

## 2020-05-11 NOTE — Telephone Encounter (Signed)
Patient called for update since she has not heard from office.  Advised patient per PCP, to have COVID testing performed and call with result. If negative, can come in for OV and labs.  Patient verbalized understanding.

## 2020-05-11 NOTE — Telephone Encounter (Signed)
I sent her a staff message on Wednesday but have not heard back

## 2020-05-14 ENCOUNTER — Telehealth: Payer: Self-pay | Admitting: Family Medicine

## 2020-05-14 NOTE — Telephone Encounter (Signed)
Caller: Jennessa Call back phone number [phone]  Patient is calling back to report Covid test was negative. The patient is wondering if she could schedule an appointment lab and office visit for ringing ears.  Please advise.

## 2020-05-15 ENCOUNTER — Ambulatory Visit (HOSPITAL_BASED_OUTPATIENT_CLINIC_OR_DEPARTMENT_OTHER)
Admission: RE | Admit: 2020-05-15 | Discharge: 2020-05-15 | Disposition: A | Discharge status: Home / Self Care | Payer: BC Managed Care – PPO | Source: Ambulatory Visit | Attending: Family Medicine | Admitting: Family Medicine

## 2020-05-15 ENCOUNTER — Ambulatory Visit (INDEPENDENT_AMBULATORY_CARE_PROVIDER_SITE_OTHER): Payer: BC Managed Care – PPO | Admitting: Family Medicine

## 2020-05-15 ENCOUNTER — Other Ambulatory Visit: Payer: Self-pay

## 2020-05-15 ENCOUNTER — Encounter: Payer: Self-pay | Admitting: Family Medicine

## 2020-05-15 VITALS — BP 148/100 | HR 84 | Temp 97.8°F | Resp 18 | Ht 68.0 in | Wt 215.0 lb

## 2020-05-15 DIAGNOSIS — R5383 Other fatigue: Secondary | ICD-10-CM | POA: Diagnosis not present

## 2020-05-15 DIAGNOSIS — R0602 Shortness of breath: Secondary | ICD-10-CM | POA: Diagnosis present

## 2020-05-15 DIAGNOSIS — I10 Essential (primary) hypertension: Secondary | ICD-10-CM | POA: Diagnosis not present

## 2020-05-15 DIAGNOSIS — T50Z95D Adverse effect of other vaccines and biological substances, subsequent encounter: Secondary | ICD-10-CM

## 2020-05-15 DIAGNOSIS — F4541 Pain disorder exclusively related to psychological factors: Secondary | ICD-10-CM | POA: Diagnosis not present

## 2020-05-15 LAB — D-DIMER, QUANTITATIVE: D-Dimer, Quant: 0.25 mcg/mL FEU (ref <0.50)

## 2020-05-15 MED ORDER — CYCLOBENZAPRINE HCL 10 MG PO TABS: 10.0000 mg | ORAL_TABLET | Freq: Three times a day (TID) | ORAL | 0 refills | Status: DC | PRN

## 2020-05-15 MED ORDER — KETOROLAC TROMETHAMINE 60 MG/2ML IM SOLN
60.0000 mg | Freq: Once | INTRAMUSCULAR | Status: AC
  Administered 2020-05-15: 60 mg via INTRAMUSCULAR

## 2020-05-15 MED ORDER — LOSARTAN POTASSIUM 50 MG PO TABS: 50.0000 mg | ORAL_TABLET | Freq: Every day | ORAL | 3 refills | Status: DC

## 2020-05-15 NOTE — Patient Instructions (Addendum)
DASH Eating Plan DASH stands for "Dietary Approaches to Stop Hypertension." The DASH eating plan is a healthy eating plan that has been shown to reduce high blood pressure (hypertension). It may also reduce your risk for type 2 diabetes, heart disease, and stroke. The DASH eating plan may also help with weight loss. What are tips for following this plan?  General guidelines  Avoid eating more than 2,300 mg (milligrams) of salt (sodium) a day. If you have hypertension, you may need to reduce your sodium intake to 1,500 mg a day.  Limit alcohol intake to no more than 1 drink a day for nonpregnant women and 2 drinks a day for men. One drink equals 12 oz of beer, 5 oz of wine, or 1 oz of hard liquor.  Work with your health care provider to maintain a healthy body weight or to lose weight. Ask what an ideal weight is for you.  Get at least 30 minutes of exercise that causes your heart to beat faster (aerobic exercise) most days of the week. Activities may include walking, swimming, or biking.  Work with your health care provider or diet and nutrition specialist (dietitian) to adjust your eating plan to your individual calorie needs. Reading food labels   Check food labels for the amount of sodium per serving. Choose foods with less than 5 percent of the Daily Value of sodium. Generally, foods with less than 300 mg of sodium per serving fit into this eating plan.  To find whole grains, look for the word "whole" as the first word in the ingredient list. Shopping  Buy products labeled as "low-sodium" or "no salt added."  Buy fresh foods. Avoid canned foods and premade or frozen meals. Cooking  Avoid adding salt when cooking. Use salt-free seasonings or herbs instead of table salt or sea salt. Check with your health care provider or pharmacist before using salt substitutes.  Do not fry foods. Cook foods using healthy methods such as baking, boiling, grilling, and broiling instead.  Cook with  heart-healthy oils, such as olive, canola, soybean, or sunflower oil. Meal planning  Eat a balanced diet that includes: ? 5 or more servings of fruits and vegetables each day. At each meal, try to fill half of your plate with fruits and vegetables. ? Up to 6-8 servings of whole grains each day. ? Less than 6 oz of lean meat, poultry, or fish each day. A 3-oz serving of meat is about the same size as a deck of cards. One egg equals 1 oz. ? 2 servings of low-fat dairy each day. ? A serving of nuts, seeds, or beans 5 times each week. ? Heart-healthy fats. Healthy fats called Omega-3 fatty acids are found in foods such as flaxseeds and coldwater fish, like sardines, salmon, and mackerel.  Limit how much you eat of the following: ? Canned or prepackaged foods. ? Food that is high in trans fat, such as fried foods. ? Food that is high in saturated fat, such as fatty meat. ? Sweets, desserts, sugary drinks, and other foods with added sugar. ? Full-fat dairy products.  Do not salt foods before eating.  Try to eat at least 2 vegetarian meals each week.  Eat more home-cooked food and less restaurant, buffet, and fast food.  When eating at a restaurant, ask that your food be prepared with less salt or no salt, if possible. What foods are recommended? The items listed may not be a complete list. Talk with your dietitian about   what dietary choices are best for you. Grains Whole-grain or whole-wheat bread. Whole-grain or whole-wheat pasta. Brown rice. Oatmeal. Quinoa. Bulgur. Whole-grain and low-sodium cereals. Pita bread. Low-fat, low-sodium crackers. Whole-wheat flour tortillas. Vegetables Fresh or frozen vegetables (raw, steamed, roasted, or grilled). Low-sodium or reduced-sodium tomato and vegetable juice. Low-sodium or reduced-sodium tomato sauce and tomato paste. Low-sodium or reduced-sodium canned vegetables. Fruits All fresh, dried, or frozen fruit. Canned fruit in natural juice (without  added sugar). Meat and other protein foods Skinless chicken or turkey. Ground chicken or turkey. Pork with fat trimmed off. Fish and seafood. Egg whites. Dried beans, peas, or lentils. Unsalted nuts, nut butters, and seeds. Unsalted canned beans. Lean cuts of beef with fat trimmed off. Low-sodium, lean deli meat. Dairy Low-fat (1%) or fat-free (skim) milk. Fat-free, low-fat, or reduced-fat cheeses. Nonfat, low-sodium ricotta or cottage cheese. Low-fat or nonfat yogurt. Low-fat, low-sodium cheese. Fats and oils Soft margarine without trans fats. Vegetable oil. Low-fat, reduced-fat, or light mayonnaise and salad dressings (reduced-sodium). Canola, safflower, olive, soybean, and sunflower oils. Avocado. Seasoning and other foods Herbs. Spices. Seasoning mixes without salt. Unsalted popcorn and pretzels. Fat-free sweets. What foods are not recommended? The items listed may not be a complete list. Talk with your dietitian about what dietary choices are best for you. Grains Baked goods made with fat, such as croissants, muffins, or some breads. Dry pasta or rice meal packs. Vegetables Creamed or fried vegetables. Vegetables in a cheese sauce. Regular canned vegetables (not low-sodium or reduced-sodium). Regular canned tomato sauce and paste (not low-sodium or reduced-sodium). Regular tomato and vegetable juice (not low-sodium or reduced-sodium). Pickles. Olives. Fruits Canned fruit in a light or heavy syrup. Fried fruit. Fruit in cream or butter sauce. Meat and other protein foods Fatty cuts of meat. Ribs. Fried meat. Bacon. Sausage. Bologna and other processed lunch meats. Salami. Fatback. Hotdogs. Bratwurst. Salted nuts and seeds. Canned beans with added salt. Canned or smoked fish. Whole eggs or egg yolks. Chicken or turkey with skin. Dairy Whole or 2% milk, cream, and half-and-half. Whole or full-fat cream cheese. Whole-fat or sweetened yogurt. Full-fat cheese. Nondairy creamers. Whipped toppings.  Processed cheese and cheese spreads. Fats and oils Butter. Stick margarine. Lard. Shortening. Ghee. Bacon fat. Tropical oils, such as coconut, palm kernel, or palm oil. Seasoning and other foods Salted popcorn and pretzels. Onion salt, garlic salt, seasoned salt, table salt, and sea salt. Worcestershire sauce. Tartar sauce. Barbecue sauce. Teriyaki sauce. Soy sauce, including reduced-sodium. Steak sauce. Canned and packaged gravies. Fish sauce. Oyster sauce. Cocktail sauce. Horseradish that you find on the shelf. Ketchup. Mustard. Meat flavorings and tenderizers. Bouillon cubes. Hot sauce and Tabasco sauce. Premade or packaged marinades. Premade or packaged taco seasonings. Relishes. Regular salad dressings. Where to find more information:  National Heart, Lung, and Blood Institute: www.nhlbi.nih.gov  American Heart Association: www.heart.org Summary  The DASH eating plan is a healthy eating plan that has been shown to reduce high blood pressure (hypertension). It may also reduce your risk for type 2 diabetes, heart disease, and stroke.  With the DASH eating plan, you should limit salt (sodium) intake to 2,300 mg a day. If you have hypertension, you may need to reduce your sodium intake to 1,500 mg a day.  When on the DASH eating plan, aim to eat more fresh fruits and vegetables, whole grains, lean proteins, low-fat dairy, and heart-healthy fats.  Work with your health care provider or diet and nutrition specialist (dietitian) to adjust your eating plan to your   individual calorie needs. This information is not intended to replace advice given to you by your health care provider. Make sure you discuss any questions you have with your health care provider. Document Revised: 11/06/2017 Document Reviewed: 11/17/2016 Elsevier Patient Education  2020 Elsevier Inc.  

## 2020-05-15 NOTE — Telephone Encounter (Signed)
yes

## 2020-05-15 NOTE — Telephone Encounter (Signed)
Ok to come in for ov? Covid test negative

## 2020-05-16 ENCOUNTER — Other Ambulatory Visit: Payer: Self-pay

## 2020-05-16 ENCOUNTER — Encounter: Payer: Self-pay | Admitting: Family Medicine

## 2020-05-16 ENCOUNTER — Telehealth: Payer: Self-pay

## 2020-05-16 DIAGNOSIS — T50Z95A Adverse effect of other vaccines and biological substances, initial encounter: Secondary | ICD-10-CM | POA: Insufficient documentation

## 2020-05-16 DIAGNOSIS — R0602 Shortness of breath: Secondary | ICD-10-CM | POA: Insufficient documentation

## 2020-05-16 DIAGNOSIS — I10 Essential (primary) hypertension: Secondary | ICD-10-CM | POA: Insufficient documentation

## 2020-05-16 DIAGNOSIS — F4541 Pain disorder exclusively related to psychological factors: Secondary | ICD-10-CM | POA: Insufficient documentation

## 2020-05-16 LAB — CBC WITH DIFFERENTIAL/PLATELET
Basophils Absolute: 0 10*3/uL (ref 0.0–0.1)
Basophils Relative: 1 % (ref 0.0–3.0)
Eosinophils Absolute: 0 10*3/uL (ref 0.0–0.7)
Eosinophils Relative: 0.8 % (ref 0.0–5.0)
HCT: 34.7 % — ABNORMAL LOW (ref 36.0–46.0)
Hemoglobin: 11.4 g/dL — ABNORMAL LOW (ref 12.0–15.0)
Lymphocytes Relative: 32.2 % (ref 12.0–46.0)
Lymphs Abs: 1.3 10*3/uL (ref 0.7–4.0)
MCHC: 32.9 g/dL (ref 30.0–36.0)
MCV: 89.8 fl (ref 78.0–100.0)
Monocytes Absolute: 0.4 10*3/uL (ref 0.1–1.0)
Monocytes Relative: 10.5 % (ref 3.0–12.0)
Neutro Abs: 2.2 10*3/uL (ref 1.4–7.7)
Neutrophils Relative %: 55.5 % (ref 43.0–77.0)
Platelets: 308 10*3/uL (ref 150.0–400.0)
RBC: 3.86 Mil/uL — ABNORMAL LOW (ref 3.87–5.11)
RDW: 12.8 % (ref 11.5–15.5)
WBC: 4 10*3/uL (ref 4.0–10.5)

## 2020-05-16 LAB — COMPREHENSIVE METABOLIC PANEL
ALT: 13 U/L (ref 0–35)
AST: 21 U/L (ref 0–37)
Albumin: 4.2 g/dL (ref 3.5–5.2)
Alkaline Phosphatase: 97 U/L (ref 39–117)
BUN: 15 mg/dL (ref 6–23)
CO2: 27 mEq/L (ref 19–32)
Calcium: 9.6 mg/dL (ref 8.4–10.5)
Chloride: 101 mEq/L (ref 96–112)
Creatinine, Ser: 0.74 mg/dL (ref 0.40–1.20)
GFR: 99.58 mL/min (ref >60.00)
Glucose, Bld: 81 mg/dL (ref 70–99)
Potassium: 4.1 mEq/L (ref 3.5–5.1)
Sodium: 137 mEq/L (ref 135–145)
Total Bilirubin: 0.4 mg/dL (ref 0.2–1.2)
Total Protein: 7 g/dL (ref 6.0–8.3)

## 2020-05-16 LAB — VITAMIN B12: Vitamin B-12: 542 pg/mL (ref 211–911)

## 2020-05-16 LAB — SARS-COV-2 IGG: SARS-COV-2 IgG: 5.86

## 2020-05-16 LAB — TSH: TSH: 1.75 u[IU]/mL (ref 0.35–4.50)

## 2020-05-16 NOTE — Assessment & Plan Note (Signed)
Poorly controlled will alter medications, encouraged DASH diet, minimize caffeine and obtain adequate sleep. Report concerning symptoms and follow up as directed and as needed 

## 2020-05-16 NOTE — Progress Notes (Signed)
ref

## 2020-05-16 NOTE — Assessment & Plan Note (Signed)
Reaction to covid vaccine #1 Symptoms not resolving  Check labs, xray  Consider referral to pulm for sob ? ent for tinnitus  F/u prn

## 2020-05-16 NOTE — Telephone Encounter (Signed)
Okay to write letter for patient for work?

## 2020-05-16 NOTE — Telephone Encounter (Signed)
I would wait to see how long it takes for her to feel better

## 2020-05-16 NOTE — Assessment & Plan Note (Signed)
May be from htn as well Muscle relaxer  bp treated  F/u 3-4 weeks

## 2020-05-16 NOTE — Telephone Encounter (Signed)
Spoke with patient. Pt would like to know if she should continue with second COVID shot. Pt states if she shouldn't she needs documentation to give to work that she can not receive second shot. Please advise.

## 2020-05-16 NOTE — Progress Notes (Signed)
Patient ID: Suzanne Singh, female    DOB: 03/22/1966  Age: 54 y.o. MRN: [id number]    Subjective:  Subjective  HPI Suzanne Singh presents for f/u reaction to covid vaccine ----  Pt still having body aches , fever off an on and tinnitis  + sob and chest tightness.   Review of Systems  Constitutional: Positive for fatigue and fever. Negative for appetite change, diaphoresis and unexpected weight change.  Eyes: Negative for pain, redness and visual disturbance.  Respiratory: Positive for chest tightness and shortness of breath. Negative for cough and wheezing.   Cardiovascular: Negative for chest pain, palpitations and leg swelling.  Endocrine: Negative for cold intolerance, heat intolerance, polydipsia, polyphagia and polyuria.  Genitourinary: Negative for difficulty urinating, dysuria and frequency.  Neurological: Positive for headaches. Negative for dizziness, light-headedness and numbness.    History Past Medical History:  Diagnosis Date  . Arrhythmia   . Arthritis   . Depression   . Eating disorder   . Fibromyalgia   . GERD (gastroesophageal reflux disease)   . IBS (irritable bowel syndrome)   . Migraine   . Seizures ([place])     She has no past surgical history on file.   Her family history includes Alcohol abuse in her father and mother; Arthritis in her maternal grandmother and mother; Breast cancer in her maternal grandmother; Colon cancer in her father and paternal grandfather; Diabetes in her father and paternal grandfather; Early death in her sister; Heart disease in her father, paternal grandfather, and paternal grandmother; Hypertension in her father, maternal grandfather, maternal grandmother, mother, paternal grandfather, and paternal grandmother; Kidney disease in her paternal grandfather; Lung cancer in her mother; Mental illness in her mother; Stroke in her maternal grandfather.She reports that she has never smoked. She has never used smokeless tobacco. She reports that  she does not drink alcohol or use drugs.  Current Outpatient Medications on File Prior to Visit  Medication Sig Dispense Refill  . albuterol (PROAIR HFA) 108 (90 Base) MCG/ACT inhaler Inhale 2 puffs into the lungs every 6 (six) hours as needed for wheezing or shortness of breath. 18 g 3  . clonazePAM (KLONOPIN) 0.5 MG tablet TAKE 1 TABLET(0.5 MG) BY MOUTH TWICE DAILY AS NEEDED FOR ANXIETY 30 tablet 0  . cyclobenzaprine (FLEXERIL) 5 MG tablet Take 1 tablet (5 mg total) by mouth 3 (three) times daily as needed for muscle spasms. 90 tablet 0  . diclofenac sodium (VOLTAREN) 1 % GEL Apply 4 g topically 4 (four) times daily. 100 g 2  . FLUoxetine (PROZAC) 40 MG capsule Take 1 capsule (40 mg total) by mouth daily. 90 capsule 3  . fluticasone (FLONASE) 50 MCG/ACT nasal spray Place 2 sprays into both nostrils daily. 16 g 6  . Ibuprofen-Famotidine 800-26.6 MG TABS Take one tablet po up to three times per day for joint pain. 90 tablet 3  . meloxicam (MOBIC) 7.5 MG tablet 1-2 po qd prn 60 tablet 2   Current Facility-Administered Medications on File Prior to Visit  Medication Dose Route Frequency Provider Last Rate Last Admin  . lidocaine (PF) (XYLOCAINE) 1 % injection 2 mL  2 mL Other Once Magnus Sinning, MD         Objective:  Objective  Physical Exam Nursing note reviewed.  Constitutional:      Appearance: She is well-developed.  HENT:     Head: Normocephalic and atraumatic.  Eyes:     Conjunctiva/sclera: Conjunctivae normal.  Neck:     Thyroid: No  thyromegaly.     Vascular: No carotid bruit or JVD.  Cardiovascular:     Rate and Rhythm: Normal rate and regular rhythm.     Heart sounds: Normal heart sounds. No murmur.  Pulmonary:     Effort: Pulmonary effort is normal. No respiratory distress.     Breath sounds: Normal breath sounds. No wheezing or rales.  Chest:     Chest wall: No tenderness.  Musculoskeletal:     Cervical back: Normal range of motion and neck supple.  Neurological:       Mental Status: She is alert and oriented to person, place, and time.    BP (!) 148/100   Pulse 84   Temp 97.8 F (36.6 C) (Temporal)   Resp 18   Ht 5\' 8"  (1.727 m)   Wt 215 lb (97.5 kg)   SpO2 99%   BMI 32.69 kg/m  Wt Readings from Last 3 Encounters:  05/15/20 215 lb (97.5 kg)  05/09/20 213 lb 9.6 oz (96.9 kg)  03/02/20 215 lb (97.5 kg)     Lab Results  Component Value Date   WBC 4.0 05/15/2020   HGB 11.4 (L) 05/15/2020   HCT 34.7 (L) 05/15/2020   PLT 308.0 05/15/2020   GLUCOSE 81 05/15/2020   CHOL 191 02/10/2019   TRIG 51.0 02/10/2019   HDL 63.50 02/10/2019   LDLCALC 118 (H) 02/10/2019   ALT 13 05/15/2020   AST 21 05/15/2020   NA 137 05/15/2020   K 4.1 05/15/2020   CL 101 05/15/2020   CREATININE 0.74 05/15/2020   BUN 15 05/15/2020   CO2 27 05/15/2020   TSH 1.75 05/15/2020    DG Chest 2 View  Result Date: 05/16/2020 CLINICAL DATA:  Cough and shortness of breath for a month. EXAM: CHEST - 2 VIEW COMPARISON:  10/25/2013 FINDINGS: Midline trachea.  Normal heart size and mediastinal contours. Sharp costophrenic angles.  No pneumothorax.  Clear lungs. IMPRESSION: Normal chest. Electronically Signed   By: 10/27/2013 M.D.   On: 05/16/2020 10:46     Assessment & Plan:  Plan  I am having 07/16/2020 start on cyclobenzaprine and losartan. I am also having her maintain her fluticasone, albuterol, diclofenac sodium, cyclobenzaprine, Ibuprofen-Famotidine, meloxicam, FLUoxetine, and clonazePAM. We administered ketorolac. We will continue to administer lidocaine (PF).  Meds ordered this encounter  Medications  . cyclobenzaprine (FLEXERIL) 10 MG tablet    Sig: Take 1 tablet (10 mg total) by mouth 3 (three) times daily as needed for muscle spasms.    Dispense:  30 tablet    Refill:  0  . losartan (COZAAR) 50 MG tablet    Sig: Take 1 tablet (50 mg total) by mouth daily.    Dispense:  30 tablet    Refill:  3  . ketorolac (TORADOL) injection 60 mg    Problem List  Items Addressed This Visit      Unprioritized   Essential hypertension    Poorly controlled will alter medications, encouraged DASH diet, minimize caffeine and obtain adequate sleep. Report concerning symptoms and follow up as directed and as needed      Relevant Medications   losartan (COZAAR) 50 MG tablet   Fatigue   Relevant Orders   CBC with Differential/Platelet (Completed)   TSH (Completed)   Vitamin B12 (Completed)   Comprehensive metabolic panel (Completed)   D-Dimer, Quantitative (Completed)   SARS-COV-2 IgG (Completed)   Immunization reaction    Reaction to covid vaccine #1 Symptoms not resolving  Check labs, xray  Consider referral to pulm for sob ? ent for tinnitus  F/u prn       SOB (shortness of breath) - Primary   Relevant Orders   DG Chest 2 View (Completed)   Comprehensive metabolic panel (Completed)   D-Dimer, Quantitative (Completed)   SARS-COV-2 IgG (Completed)   Stress headache    May be from htn as well Muscle relaxer  bp treated  F/u 3-4 weeks       Relevant Medications   cyclobenzaprine (FLEXERIL) 10 MG tablet      Follow-up: Return in about 3 weeks (around 06/05/2020), or if symptoms worsen or fail to improve, for hypertension.  Donato Schultz, DO

## 2020-05-17 NOTE — Telephone Encounter (Signed)
yes

## 2020-05-18 NOTE — Telephone Encounter (Signed)
Spoke with patient. Pt requested letter for work be sent to [employer].

## 2020-05-22 ENCOUNTER — Other Ambulatory Visit: Payer: Self-pay | Admitting: Family Medicine

## 2020-05-22 ENCOUNTER — Encounter: Payer: Self-pay | Admitting: Family Medicine

## 2020-05-22 DIAGNOSIS — H9313 Tinnitus, bilateral: Secondary | ICD-10-CM

## 2020-05-22 MED ORDER — PREDNISONE 10 MG PO TABS: ORAL_TABLET | ORAL | 0 refills | Status: DC

## 2020-05-22 NOTE — Telephone Encounter (Signed)
Spoke with patient. Pt willing to try Predisone. [facility] sent in Rx.

## 2020-05-22 NOTE — Telephone Encounter (Signed)
Did you call pt about the prednisone last week I had another pt with similar symptoms--- actuall 2 other s They responded to prednisone

## 2020-05-29 ENCOUNTER — Encounter: Payer: Self-pay | Admitting: Family Medicine

## 2020-06-01 ENCOUNTER — Ambulatory Visit (INDEPENDENT_AMBULATORY_CARE_PROVIDER_SITE_OTHER): Payer: BC Managed Care – PPO | Admitting: Family Medicine

## 2020-06-01 ENCOUNTER — Telehealth: Payer: Self-pay | Admitting: Nurse Practitioner

## 2020-06-01 ENCOUNTER — Encounter: Payer: Self-pay | Admitting: Family Medicine

## 2020-06-01 ENCOUNTER — Other Ambulatory Visit: Payer: Self-pay

## 2020-06-01 VITALS — BP 109/76 | HR 73 | Temp 97.7°F | Resp 16 | Ht 69.0 in | Wt 216.0 lb

## 2020-06-01 DIAGNOSIS — I10 Essential (primary) hypertension: Secondary | ICD-10-CM | POA: Diagnosis not present

## 2020-06-01 DIAGNOSIS — H65113 Acute and subacute allergic otitis media (mucoid) (sanguinous) (serous), bilateral: Secondary | ICD-10-CM | POA: Diagnosis not present

## 2020-06-01 DIAGNOSIS — T50Z95D Adverse effect of other vaccines and biological substances, subsequent encounter: Secondary | ICD-10-CM

## 2020-06-01 MED ORDER — FLUTICASONE PROPIONATE 50 MCG/ACT NA SUSP: 2.0000 | Freq: Every day | NASAL | 6 refills | Status: DC

## 2020-06-01 NOTE — Telephone Encounter (Signed)
Called to discuss ongoing symptoms after Covid vaccine with patient - made appt to see me at [facility] care center for further evaluation

## 2020-06-01 NOTE — Progress Notes (Signed)
Check labs.  Adjust meds prnCheck labs.  Adjust meds prnCheck labs.  Adjust meds prn Patient ID: Suzanne Singh, female    DOB: January 03, 1966  Age: 54 y.o. MRN: [id number]    Subjective:  Subjective  HPI Suzanne Singh presents for f/u tinnitus, headaches.   She had the moderna vaccine 04/25/20 and started having bodyaches, headaches , tinnitus and fevers after that --- the fevers have stopped but she still has ha, bodyaches and tinnitus   She is still on the pred-- has 2 days left    Her ears feel full like she is under water.    No new symptoms   Review of Systems  Constitutional: Negative for appetite change, diaphoresis, fatigue and unexpected weight change.  HENT: Positive for ear pain and tinnitus.   Eyes: Negative for pain, redness and visual disturbance.  Respiratory: Negative for cough, chest tightness, shortness of breath and wheezing.   Cardiovascular: Negative for chest pain, palpitations and leg swelling.  Endocrine: Negative for cold intolerance, heat intolerance, polydipsia, polyphagia and polyuria.  Genitourinary: Negative for difficulty urinating, dysuria and frequency.  Neurological: Positive for dizziness. Negative for light-headedness, numbness and headaches.    History Past Medical History:  Diagnosis Date  . Arrhythmia   . Arthritis   . Depression   . Eating disorder   . Fibromyalgia   . GERD (gastroesophageal reflux disease)   . IBS (irritable bowel syndrome)   . Migraine   . Seizures ([place])     She has no past surgical history on file.   Her family history includes Alcohol abuse in her father and mother; Arthritis in her maternal grandmother and mother; Breast cancer in her maternal grandmother; Colon cancer in her father and paternal grandfather; Diabetes in her father and paternal grandfather; Early death in her sister; Heart disease in her father, paternal grandfather, and paternal grandmother; Hypertension in her father, maternal grandfather, maternal  grandmother, mother, paternal grandfather, and paternal grandmother; Kidney disease in her paternal grandfather; Lung cancer in her mother; Mental illness in her mother; Stroke in her maternal grandfather.She reports that she has never smoked. She has never used smokeless tobacco. She reports that she does not drink alcohol and does not use drugs.  Current Outpatient Medications on File Prior to Visit  Medication Sig Dispense Refill  . albuterol (PROAIR HFA) 108 (90 Base) MCG/ACT inhaler Inhale 2 puffs into the lungs every 6 (six) hours as needed for wheezing or shortness of breath. 18 g 3  . clonazePAM (KLONOPIN) 0.5 MG tablet TAKE 1 TABLET(0.5 MG) BY MOUTH TWICE DAILY AS NEEDED FOR ANXIETY 30 tablet 0  . cyclobenzaprine (FLEXERIL) 10 MG tablet Take 1 tablet (10 mg total) by mouth 3 (three) times daily as needed for muscle spasms. 30 tablet 0  . cyclobenzaprine (FLEXERIL) 5 MG tablet Take 1 tablet (5 mg total) by mouth 3 (three) times daily as needed for muscle spasms. 90 tablet 0  . diclofenac sodium (VOLTAREN) 1 % GEL Apply 4 g topically 4 (four) times daily. 100 g 2  . FLUoxetine (PROZAC) 40 MG capsule Take 1 capsule (40 mg total) by mouth daily. 90 capsule 3  . fluticasone (FLONASE) 50 MCG/ACT nasal spray Place 2 sprays into both nostrils daily. 16 g 6  . Ibuprofen-Famotidine 800-26.6 MG TABS Take one tablet po up to three times per day for joint pain. 90 tablet 3  . losartan (COZAAR) 50 MG tablet Take 1 tablet (50 mg total) by mouth daily. 30 tablet 3  .  meloxicam (MOBIC) 7.5 MG tablet 1-2 po qd prn 60 tablet 2  . predniSONE (DELTASONE) 10 MG tablet TAKE 3 TABLETS PO QD FOR 3 DAYS THEN TAKE 2 TABLETS PO QD FOR 3 DAYS THEN TAKE 1 TABLET PO QD FOR 3 DAYS THEN TAKE 1/2 TAB PO QD FOR 3 DAYS 20 tablet 0   Current Facility-Administered Medications on File Prior to Visit  Medication Dose Route Frequency Provider Last Rate Last Admin  . lidocaine (PF) (XYLOCAINE) 1 % injection 2 mL  2 mL Other Once  Tyrell Antonio, MD         Objective:  Objective  Physical Exam Vitals and nursing note reviewed.  Constitutional:      Appearance: She is well-developed.  HENT:     Head: Normocephalic and atraumatic.     Right Ear: No tenderness. A middle ear effusion is present. Tympanic membrane is not injected.     Left Ear: No tenderness. A middle ear effusion is present. Tympanic membrane is not injected.  Eyes:     Conjunctiva/sclera: Conjunctivae normal.  Neck:     Thyroid: No thyromegaly.     Vascular: No carotid bruit or JVD.  Cardiovascular:     Rate and Rhythm: Normal rate and regular rhythm.     Heart sounds: Normal heart sounds. No murmur heard.   Pulmonary:     Effort: Pulmonary effort is normal. No respiratory distress.     Breath sounds: Normal breath sounds. No wheezing or rales.  Chest:     Chest wall: No tenderness.  Musculoskeletal:     Cervical back: Normal range of motion and neck supple.  Neurological:     Mental Status: She is alert and oriented to person, place, and time.    BP 109/76 (BP Location: Left Arm, Patient Position: Sitting, Cuff Size: Large)   Pulse 73   Temp 97.7 F (36.5 C) (Temporal)   Resp 16   Ht 5\' 9"  (1.753 m)   Wt 216 lb (98 kg)   SpO2 98%   BMI 31.90 kg/m  Wt Readings from Last 3 Encounters:  06/01/20 216 lb (98 kg)  05/15/20 215 lb (97.5 kg)  05/09/20 213 lb 9.6 oz (96.9 kg)     Lab Results  Component Value Date   WBC 4.0 05/15/2020   HGB 11.4 (L) 05/15/2020   HCT 34.7 (L) 05/15/2020   PLT 308.0 05/15/2020   GLUCOSE 81 05/15/2020   CHOL 191 02/10/2019   TRIG 51.0 02/10/2019   HDL 63.50 02/10/2019   LDLCALC 118 (H) 02/10/2019   ALT 13 05/15/2020   AST 21 05/15/2020   NA 137 05/15/2020   K 4.1 05/15/2020   CL 101 05/15/2020   CREATININE 0.74 05/15/2020   BUN 15 05/15/2020   CO2 27 05/15/2020   TSH 1.75 05/15/2020    DG Chest 2 View  Result Date: 05/16/2020 CLINICAL DATA:  Cough and shortness of breath for a month.  EXAM: CHEST - 2 VIEW COMPARISON:  10/25/2013 FINDINGS: Midline trachea.  Normal heart size and mediastinal contours. Sharp costophrenic angles.  No pneumothorax.  Clear lungs. IMPRESSION: Normal chest. Electronically Signed   By: 10/27/2013 M.D.   On: 05/16/2020 10:46     Assessment & Plan:  Plan  I am having 07/16/2020 start on fluticasone. I am also having her maintain her fluticasone, albuterol, diclofenac sodium, cyclobenzaprine, Ibuprofen-Famotidine, meloxicam, FLUoxetine, clonazePAM, cyclobenzaprine, losartan, and predniSONE. We will continue to administer lidocaine (PF).  Meds ordered this encounter  Medications  . fluticasone (FLONASE) 50 MCG/ACT nasal spray    Sig: Place 2 sprays into both nostrils daily.    Dispense:  16 g    Refill:  6    Problem List Items Addressed This Visit      Unprioritized   Essential hypertension    Running low Cut losartan in 1/2       Immunization reaction    [facility] will contact pt and she may be soon at post covid clinic at [place]  Hold off 2nd vaccine for now       Other Visit Diagnoses    Acute allergic serous otitis media of both ears    -  Primary   Relevant Medications   fluticasone (FLONASE) 50 MCG/ACT nasal spray      con't antihistamine and start flonase   Follow-up: Return if symptoms worsen or fail to improve.  Donato Schultz, DO

## 2020-06-01 NOTE — Assessment & Plan Note (Signed)
[facility]bound vaccine clinic will contact pt and she may be soon at post covid clinic at [place]  Hold off 2nd vaccine for now

## 2020-06-01 NOTE — Patient Instructions (Signed)

## 2020-06-01 NOTE — Assessment & Plan Note (Signed)
Running low Cut losartan in 1/2

## 2020-06-14 ENCOUNTER — Encounter: Payer: Self-pay | Admitting: Counselor

## 2020-06-14 ENCOUNTER — Ambulatory Visit (INDEPENDENT_AMBULATORY_CARE_PROVIDER_SITE_OTHER): Payer: BC Managed Care – PPO | Admitting: Nurse Practitioner

## 2020-06-14 ENCOUNTER — Other Ambulatory Visit: Payer: Self-pay

## 2020-06-14 VITALS — HR 85 | Temp 97.5°F | Resp 18

## 2020-06-14 DIAGNOSIS — R0602 Shortness of breath: Secondary | ICD-10-CM | POA: Insufficient documentation

## 2020-06-14 DIAGNOSIS — R000 Tachycardia, unspecified: Secondary | ICD-10-CM | POA: Diagnosis not present

## 2020-06-14 DIAGNOSIS — R4189 Other symptoms and signs involving cognitive functions and awareness: Secondary | ICD-10-CM | POA: Insufficient documentation

## 2020-06-14 DIAGNOSIS — H8113 Benign paroxysmal vertigo, bilateral: Secondary | ICD-10-CM

## 2020-06-14 DIAGNOSIS — T50Z95A Adverse effect of other vaccines and biological substances, initial encounter: Secondary | ICD-10-CM | POA: Diagnosis not present

## 2020-06-14 DIAGNOSIS — R413 Other amnesia: Secondary | ICD-10-CM

## 2020-06-14 DIAGNOSIS — R5383 Other fatigue: Secondary | ICD-10-CM | POA: Diagnosis not present

## 2020-06-14 DIAGNOSIS — R5381 Other malaise: Secondary | ICD-10-CM

## 2020-06-14 DIAGNOSIS — H9313 Tinnitus, bilateral: Secondary | ICD-10-CM

## 2020-06-14 DIAGNOSIS — F488 Other specified nonpsychotic mental disorders: Secondary | ICD-10-CM

## 2020-06-14 NOTE — Assessment & Plan Note (Addendum)
Hives with hot flashes Itching History of environmental and seasonal allergies:   Assessment: Hard to tell if this was a reaction to the Covid Vaccine or if patient was actually sick with Covid shortly after receiving vaccine. Her rapid covid test came back negative, but antibody was positive - although this was after 1 st vaccine. Patient continues to have obgoing issues. Recent chest x ray clear.   Will refer to allergist  Fatigue Vertigo Tinnitus:  Will place referral to PT  Will check labs and call with results  Tachycardia:  Will refer to Dr. Graciela Husbands for further evaluation - ? POTS  Note: patient walked in office today. O2 sats remained above 97% for entire walk - heart rate elevated with minimal exertion at 120 BPM.    Brain fog and memory loss:  Will refer to neuropsychology  Hand out given on memory care tips  Follow up:  Follow up in 3 months or sooner if needed

## 2020-06-14 NOTE — Patient Instructions (Addendum)
Covid Vaccine Reaction Hives Itching History of environmental and seasonal allergies:  Will refer to allergist  May trial taking 2 allegra and 2 zyrtec daily for the next couple of weeks  Fatigue Vertigo Tinnitus:  Will place referral to PT  Will check labs and call with results  Tachycardia:  Will refer to Dr. Graciela Husbands for further evaluation - ? POTS  Note: patient walked in office today. O2 sats remained above 97% for entire walk - heart rate elevated with minimal exertion at 120 BPM.    Brain fog and memory loss:  Will refer to neuropsychology  Hand out given on memory care tips  Follow up:  Follow up in 3 months or sooner if needed

## 2020-06-14 NOTE — Progress Notes (Signed)
@Patient  ID: , female    DOB: 1966-12-08, 54 y.o.   MRN: [id number]  Chief Complaint  Patient presents with  . covid vaccine reaction    multiple symptoms after first covid vaccine    Referring provider: [id number], *   54 year old female with history of migraines, and seasonal and environmental allergies.   HPI   Patient presents today to post Covid care clinic for questionable reaction to first Covid vaccine.  Patient has never tested positive for Covid.  Patient received her first Moderna vaccine on 04/25/2020.  After receiving this vaccine she became symptomatic with fevers, chills, migraines, fatigue, and brain fog.  She was tested for Covid on 05/15/2020 at [facility] and the test came back negative.  She continues to have multiple ongoing symptoms after having this vaccine and was referred here today by PCP.  Patient complains of ongoing headaches, shortness of breath with exertion, heart racing, hot flashes, hives associated with hot flashes, itching in fingers and toes, vertigo, and ringing in ears.  She also complains of extreme fatigue.  Patient states that she used to walk 3 miles daily and now she can only make it about 1 mile before becoming too fatigued.  She has to lay down by 3:00 pm every day.  She states that her fevers have subsided.  Her headaches seem to be under control and she does have a past history of migraines.  Patient has been seen by allergy specialist in the past due to extreme seasonal and environmental allergies.  She is currently taking Allegra, Flonase, and albuterol as needed.  Patient reports that her blood pressure has also been elevated since receiving the vaccine and at her last visit with PCP she was able to decrease her dose of blood pressure medicine.  She was not on any blood pressure medicine before receiving the vaccine.  Patient reports family history of sarcoidosis and lupus.  We will check labs with inflammatory markers today.  Patient  also complains today of ongoing memory loss and brain fog.  Denies f/c/s, n/v/d, hemoptysis, PND, chest pain or edema.  Note: patient walked in office today. O2 sats remained above 97% for entire walk - heart rate elevated with minimal exertion at 120 BPM.       Allergies  Allergen Reactions  . Ambien Cr [Zolpidem Tartrate Er]     Seizure   . Cymbalta [Duloxetine Hcl]     Seizure   . Celebrex [Celecoxib]     Hives   . Hydrocodone Itching  . Latex Rash    Adhesive    Immunization History  Administered Date(s) Administered  . Influenza,inj,Quad PF,6+ Mos 09/21/2014, 12/06/2015, 10/04/2017, 10/28/2018  . Moderna SARS-COVID-2 Vaccination 04/25/2020  . Tdap 12/08/2006, 12/22/2017  . Zoster Recombinat (Shingrix) 04/12/2019    Past Medical History:  Diagnosis Date  . Arrhythmia   . Arthritis   . Depression   . Eating disorder   . Fibromyalgia   . GERD (gastroesophageal reflux disease)   . IBS (irritable bowel syndrome)   . Migraine   . Seizures (HCC)     Tobacco History: Social History   Tobacco Use  Smoking Status Never Smoker  Smokeless Tobacco Never Used   Counseling given: Yes   Outpatient Encounter Medications as of 06/14/2020  Medication Sig  . albuterol (PROAIR HFA) 108 (90 Base) MCG/ACT inhaler Inhale 2 puffs into the lungs every 6 (six) hours as needed for wheezing or shortness of breath.  08/15/2020  clonazePAM (KLONOPIN) 0.5 MG tablet TAKE 1 TABLET(0.5 MG) BY MOUTH TWICE DAILY AS NEEDED FOR ANXIETY  . cyclobenzaprine (FLEXERIL) 10 MG tablet Take 1 tablet (10 mg total) by mouth 3 (three) times daily as needed for muscle spasms.  . cyclobenzaprine (FLEXERIL) 5 MG tablet Take 1 tablet (5 mg total) by mouth 3 (three) times daily as needed for muscle spasms.  . diclofenac sodium (VOLTAREN) 1 % GEL Apply 4 g topically 4 (four) times daily.  Marland Kitchen FLUoxetine (PROZAC) 40 MG capsule Take 1 capsule (40 mg total) by mouth daily.  . fluticasone (FLONASE) 50 MCG/ACT nasal spray  Place 2 sprays into both nostrils daily.  . fluticasone (FLONASE) 50 MCG/ACT nasal spray Place 2 sprays into both nostrils daily.  . Ibuprofen-Famotidine 800-26.6 MG TABS Take one tablet po up to three times per day for joint pain.  Marland Kitchen losartan (COZAAR) 50 MG tablet Take 1 tablet (50 mg total) by mouth daily.  . meloxicam (MOBIC) 7.5 MG tablet 1-2 po qd prn  . predniSONE (DELTASONE) 10 MG tablet TAKE 3 TABLETS PO QD FOR 3 DAYS THEN TAKE 2 TABLETS PO QD FOR 3 DAYS THEN TAKE 1 TABLET PO QD FOR 3 DAYS THEN TAKE 1/2 TAB PO QD FOR 3 DAYS   Facility-Administered Encounter Medications as of 06/14/2020  Medication  . lidocaine (PF) (XYLOCAINE) 1 % injection 2 mL     Review of Systems  Review of Systems  Constitutional: Positive for fatigue.  HENT: Positive for tinnitus.   Respiratory: Positive for shortness of breath (with exertion). Negative for cough.   Cardiovascular: Positive for palpitations. Negative for chest pain and leg swelling.  Gastrointestinal: Negative.   Allergic/Immunologic: Negative.   Neurological: Positive for dizziness and headaches.  Psychiatric/Behavioral: Positive for decreased concentration.       Physical Exam  Pulse 85   Temp (!) 97.5 F (36.4 C)   Resp 18   SpO2 97% Comment: RA  Wt Readings from Last 5 Encounters:  06/01/20 216 lb (98 kg)  05/15/20 215 lb (97.5 kg)  05/09/20 213 lb 9.6 oz (96.9 kg)  03/02/20 215 lb (97.5 kg)  03/07/19 207 lb (93.9 kg)     Physical Exam Vitals and nursing note reviewed.  Constitutional:      General: She is not in acute distress.    Appearance: She is well-developed.  Cardiovascular:     Rate and Rhythm: Regular rhythm. Tachycardia present.  Pulmonary:     Effort: Pulmonary effort is normal.     Breath sounds: Normal breath sounds. No wheezing or rhonchi.  Musculoskeletal:     Right lower leg: No edema.     Left lower leg: No edema.  Neurological:     Mental Status: She is alert and oriented to person, place,  and time.  Psychiatric:        Mood and Affect: Mood normal.        Behavior: Behavior normal.     Imaging: DG Chest 2 View  Result Date: 05/16/2020 CLINICAL DATA:  Cough and shortness of breath for a month. EXAM: CHEST - 2 VIEW COMPARISON:  10/25/2013 FINDINGS: Midline trachea.  Normal heart size and mediastinal contours. Sharp costophrenic angles.  No pneumothorax.  Clear lungs. IMPRESSION: Normal chest. Electronically Signed   By: Jeronimo Greaves M.D.   On: 05/16/2020 10:46     Assessment & Plan:   Immunization reaction Hives with hot flashes Itching History of environmental and seasonal allergies:   Assessment: Hard to tell  if this was a reaction to the Covid Vaccine or if patient was actually sick with Covid shortly after receiving vaccine. Her rapid covid test came back negative, but antibody was positive - although this was after 1 st vaccine. Patient continues to have obgoing issues. Recent chest x ray clear.   Will refer to allergist  Fatigue Vertigo Tinnitus:  Will place referral to PT  Will check labs and call with results  Tachycardia:  Will refer to Dr. Graciela Husbands for further evaluation - ? POTS  Note: patient walked in office today. O2 sats remained above 97% for entire walk - heart rate elevated with minimal exertion at 120 BPM.    Brain fog and memory loss:  Will refer to neuropsychology  Hand out given on memory care tips  Follow up:  Follow up in 3 months or sooner if needed           Ivonne Andrew, NP 06/14/2020

## 2020-06-19 ENCOUNTER — Other Ambulatory Visit: Payer: Self-pay | Admitting: Nurse Practitioner

## 2020-06-19 ENCOUNTER — Encounter: Payer: Self-pay | Admitting: Family Medicine

## 2020-06-19 DIAGNOSIS — R5381 Other malaise: Secondary | ICD-10-CM

## 2020-06-19 DIAGNOSIS — R700 Elevated erythrocyte sedimentation rate: Secondary | ICD-10-CM

## 2020-06-19 DIAGNOSIS — Z8616 Personal history of COVID-19: Secondary | ICD-10-CM

## 2020-06-19 LAB — COMPREHENSIVE METABOLIC PANEL
ALT: 15 IU/L (ref 0–32)
AST: 24 IU/L (ref 0–40)
Albumin/Globulin Ratio: 1.6 (ref 1.2–2.2)
Albumin: 4.4 g/dL (ref 3.8–4.9)
Alkaline Phosphatase: 126 IU/L — ABNORMAL HIGH (ref 48–121)
BUN/Creatinine Ratio: 23 (ref 9–23)
BUN: 18 mg/dL (ref 6–24)
Bilirubin Total: 0.2 mg/dL (ref 0.0–1.2)
CO2: 24 mmol/L (ref 20–29)
Calcium: 10 mg/dL (ref 8.7–10.2)
Chloride: 102 mmol/L (ref 96–106)
Creatinine, Ser: 0.77 mg/dL (ref 0.57–1.00)
GFR calc Af Amer: 103 mL/min/{1.73_m2} (ref >59)
GFR calc non Af Amer: 89 mL/min/{1.73_m2} (ref >59)
Globulin, Total: 2.7 g/dL (ref 1.5–4.5)
Glucose: 92 mg/dL (ref 65–99)
Potassium: 4.4 mmol/L (ref 3.5–5.2)
Sodium: 138 mmol/L (ref 134–144)
Total Protein: 7.1 g/dL (ref 6.0–8.5)

## 2020-06-19 LAB — CBC
Hematocrit: 37.1 % (ref 34.0–46.6)
Hemoglobin: 11.9 g/dL (ref 11.1–15.9)
MCH: 28.7 pg (ref 26.6–33.0)
MCHC: 32.1 g/dL (ref 31.5–35.7)
MCV: 90 fL (ref 79–97)
Platelets: 338 10*3/uL (ref 150–450)
RBC: 4.14 x10E6/uL (ref 3.77–5.28)
RDW: 12.5 % (ref 11.7–15.4)
WBC: 5.4 10*3/uL (ref 3.4–10.8)

## 2020-06-19 LAB — SEDIMENTATION RATE: Sed Rate: 69 mm/hr — ABNORMAL HIGH (ref 0–40)

## 2020-06-19 LAB — C-REACTIVE PROTEIN: CRP: <1 mg/L (ref 0–10)

## 2020-06-19 LAB — ANA: Anti Nuclear Antibody (ANA): NEGATIVE

## 2020-06-22 ENCOUNTER — Telehealth: Payer: Self-pay | Admitting: Allergy & Immunology

## 2020-06-22 NOTE — Telephone Encounter (Signed)
Left message for patient to call back  

## 2020-06-22 NOTE — Telephone Encounter (Signed)
Referral sent over from Angus Seller, NP [facility] at [facility] for possible;e reaction to COVID vaccine. Per referring provider, " Patient received her first Moderna vaccine on 04/25/2020.  After receiving this vaccine she became symptomatic with fevers, chills, migraines, fatigue, and brain fog...  Patient complains of ongoing headaches, shortness of breath with exertion, heart racing, hot flashes, hives associated with hot flashes, itching in fingers and toes, vertigo, and ringing in ears.  She also complains of extreme fatigue.Marland KitchenMarland KitchenPatient reports that her blood pressure has also been elevated since receiving the vaccine and at her last visit with PCP she was able to decrease her dose of blood pressure medicine. "  Please advise if initial visit is needed or if testing can be scheduled.

## 2020-07-05 ENCOUNTER — Ambulatory Visit: Payer: BC Managed Care – PPO | Admitting: Physical Therapy

## 2020-07-26 ENCOUNTER — Other Ambulatory Visit: Payer: Self-pay

## 2020-07-26 ENCOUNTER — Ambulatory Visit: Payer: BC Managed Care – PPO | Attending: Family Medicine | Admitting: Physical Therapy

## 2020-07-26 ENCOUNTER — Encounter: Payer: Self-pay | Admitting: Physical Therapy

## 2020-07-26 VITALS — BP 105/79 | HR 76

## 2020-07-26 DIAGNOSIS — R29818 Other symptoms and signs involving the nervous system: Secondary | ICD-10-CM | POA: Diagnosis present

## 2020-07-26 DIAGNOSIS — M542 Cervicalgia: Secondary | ICD-10-CM | POA: Diagnosis present

## 2020-07-26 DIAGNOSIS — R262 Difficulty in walking, not elsewhere classified: Secondary | ICD-10-CM | POA: Diagnosis present

## 2020-07-26 DIAGNOSIS — M6281 Muscle weakness (generalized): Secondary | ICD-10-CM | POA: Insufficient documentation

## 2020-07-26 DIAGNOSIS — R42 Dizziness and giddiness: Secondary | ICD-10-CM | POA: Diagnosis present

## 2020-07-26 NOTE — Therapy (Signed)
[facility] [address] [place], [place], [address] Phone: [phone]   Fax:  [phone]  Physical Therapy Evaluation  Patient Details  Name: Suzanne Singh MRN: [id number] Date of Birth: 05-Jun-1966 Referring Provider (PT): Ivonne Andrew, NP   Encounter Date: 07/26/2020   PT End of Session - 07/26/20 1243    Visit Number 1    Number of Visits 17    Date for PT Re-Evaluation 09/24/20    Authorization Type [employer]    PT Start Time 0800    PT Stop Time 0850    PT Time Calculation (min) 50 min    Activity Tolerance Patient limited by fatigue;Other (comment)   dizziness   Behavior During Therapy WFL for tasks assessed/performed           Past Medical History:  Diagnosis Date  . Arrhythmia   . Arthritis   . Depression   . Eating disorder   . Fibromyalgia   . GERD (gastroesophageal reflux disease)   . IBS (irritable bowel syndrome)   . Migraine   . Seizures (HCC)     History reviewed. No pertinent surgical history.  Vitals:   07/26/20 0809  BP: 105/79  Pulse: 76      Subjective Assessment - 07/26/20 0809    Subjective Had first COVID vaccine in April and had a severe reaction - fevers, night sweats, migraines, tinnitus, brain fog, not sleeping, pain in neck and shoulders, dizziness, light sensitivity, noise sensitivity, high BP, itching, hives.  Did not take second vaccine.  Took a round of prednisone with little to no relief.    Pertinent History arrhythmia, OA, depression, eating disorder, fibromyalgia, GERD, IBS, migraine, seizures    How long can you stand comfortably? has to take sitting rest breaks    How long can you walk comfortably? was walking 4 miles down to less than a mile a day    Diagnostic tests chest xray    Patient Stated Goals get rid of migraines, ringing in ears, neck/shoulder pain and dizziness.    Currently in Pain? Yes    Pain Score 8     Pain Location Neck    Pain Orientation  Right;Left    Pain Descriptors / Indicators Sore    Pain Type Chronic pain    Pain Onset More than a month ago    Pain Frequency Constant    Pain Relieving Factors massage chair              [facility] PT Assessment - 07/26/20 0818      Assessment   Medical Diagnosis Post-COVID vaccine    Referring Provider (PT) Ivonne Andrew, NP    Onset Date/Surgical Date 06/14/20    Hand Dominance Right      Precautions   Precautions Other (comment)    Precaution Comments arrhythmia, OA, depression, eating disorder, fibromyalgia, GERD, IBS, migraine, seizures      Balance Screen   Has the patient fallen in the past 6 months No    Has the patient had a decrease in activity level because of a fear of falling?  Yes      Home Environment   Living Environment Private residence    Living Arrangements Spouse/significant other;Children    Type of Home House    Home Access Stairs to enter    Entrance Stairs-Number of Steps 0    Home Layout Two level;Bed/bath upstairs    Alternate Level Stairs-Number of Steps 12  Alternate Level Stairs-Rails Right    Home Web designer - single point      Prior Function   Level of Independence Independent    Vocation Full time employment    Buyer, retail    Leisure walk 4 miles/day, reading      Cognition   Overall Cognitive Status Impaired/Different from baseline   reports brain fog     Observation/Other Assessments   Focus on Therapeutic Outcomes (FOTO)  56%    Other Surveys  Dizziness Handicap Inventory (DHI)    Dizziness Handicap Inventory (DHI)  77      Sensation   Light Touch Appears Intact    Additional Comments LLE will feel numb if lying a certain way      Coordination   Finger Nose Finger Test [facility]    Heel Shin Test WFL      ROM / Strength   AROM / PROM / Strength Strength;AROM      AROM   Overall AROM  Deficits    AROM Assessment Site Cervical    Cervical Flexion 45    Cervical Extension 40    Cervical - Right  Side Bend 20    Cervical - Left Side Bend 25    Cervical - Right Rotation 45    Cervical - Left Rotation 45      Strength   Overall Strength Deficits    Overall Strength Comments slight difference L to R; L side 4+/5, R side 4-/5                  Vestibular Assessment - 07/26/20 0835      Vestibular Assessment   General Observation Reports having tinnitus, migraines 1x/week, wearing light sensitivity glasses      Symptom Behavior   Type of Dizziness  Comment;Blurred vision   woozy, floaters/spots in vision   Frequency of Dizziness daily    Duration of Dizziness minutes    Symptom Nature Spontaneous    Aggravating Factors Mornings;Turning body quickly;Turning head quickly;Looking up to the ceiling    Relieving Factors Lying supine   lying on the floor   Progression of Symptoms No change since onset      Oculomotor Exam   Oculomotor Alignment Normal    Spontaneous Absent    Gaze-induced  Absent    Smooth Pursuits Comment   significant dizziness   Saccades Slow;Poor trajectory   dizziness   Comment wears bifocals; with eye movement pt reported increased dizziness and tinnitus      Oculomotor Exam-Fixation Suppressed    Left Head Impulse not able to perform due to neck guarding    Right Head Impulse not able to perform due to neck guarding      Vestibulo-Ocular Reflex   VOR to Slow Head Movement Comment   dizziness, limited neck ROM   VOR Cancellation Comment   dizziness, eye strain, limited neck ROM     Positional Testing   Sidelying Test Sidelying Right;Sidelying Left      Sidelying Right   Sidelying Right Duration 0    Sidelying Right Symptoms No nystagmus      Sidelying Left   Sidelying Left Duration 0    Sidelying Left Symptoms No nystagmus              Objective measurements completed on examination: See above findings.               PT Education - 07/26/20 1243    Education Details clinical  findings, PT POC and goals    Person(s)  Educated Patient    Methods Explanation    Comprehension Verbalized understanding            PT Short Term Goals - 07/26/20 1255      PT SHORT TERM GOAL #1   Title Patient will tolerate rest of vestibular assessment and assessment of endurance (MSQ, DVA, 6 minute walk test)    Time 4    Period Weeks    Status New    Target Date 08/25/20      PT SHORT TERM GOAL #2   Title Pt will initiate HEP for neck, dizziness and endurance    Time 4    Period Weeks    Status New    Target Date 08/25/20      PT SHORT TERM GOAL #3   Title Pt will demonstrate 10 deg improvement in neck lateral flexion and rotation and report pain in neck/tension HA with work and driving </= 1/615/10    Baseline 20/25/45/45  R/L; 8/10    Time 4    Period Weeks    Status New    Target Date 08/25/20      PT SHORT TERM GOAL #4   Title Pt will initiate walking program and will progress to walking >1.5 miles each day    Baseline 1 mile    Time 4    Period Weeks    Status New    Target Date 08/25/20      PT SHORT TERM GOAL #5   Title Pt will negotiate 12 stairs with step over step sequence and R rail MOD I    Baseline TBD    Time 4    Period Weeks    Status New    Target Date 08/25/20             PT Long Term Goals - 07/26/20 1304      PT LONG TERM GOAL #1   Title Pt will demonstrate independence with HEP and will return to walking >2 miles 5x/week    Time 8    Period Weeks    Status New    Target Date 09/24/20      PT LONG TERM GOAL #2   Title Pt will demonstrate lateral flexion >40 deg and rotation to 60 deg and will report pain </= 3/10 in neck and tension HA    Time 8    Period Weeks    Status New    Target Date 09/24/20      PT LONG TERM GOAL #3   Title Pt will report motion sensitivity at 0-1/5 for all movements    Baseline MSQ TBD    Time 8    Period Weeks    Status New    Target Date 09/24/20      PT LONG TERM GOAL #4   Title DVA goal if indicated      PT LONG TERM GOAL #5     Title Pt will increase overall function on FOTO to >77% and will decrease DHI by 18 points    Baseline FOTO 56% and DHI 72 severe    Time 8    Period Weeks    Status New    Target Date 09/24/20                  Plan - 07/26/20 1245    Clinical Impression Statement Pt is a 54 year old female referred to Neuro OPPT  for evaluation of deconditioning and dizziness after reaction to COVID vaccine.  Pt's PMH is significant for the following: arrhythmia, OA, depression, eating disorder, fibromyalgia, GERD, IBS, migraine, seizures. The following deficits were noted during pt's exam: impaired activity tolerance/endurance, impaired LE strength, disequilibrium and visual motion sensitivity, tension and migraine headaches, decreased neck ROM and pain in neck/shoulders, impaired standing balance and difficulty walking.  Pt would benefit from skilled PT to address these impairments and functional limitations to maximize functional mobility independence and reduce falls risk.    Personal Factors and Comorbidities Comorbidity 3+;Profession;Time since onset of injury/illness/exacerbation    Comorbidities arrhythmia, OA, depression, eating disorder, fibromyalgia, GERD, IBS, migraine, seizures    Examination-Activity Limitations Bed Mobility;Locomotion Level;Reach Overhead;Sleep;Stairs;Stand    Examination-Participation Restrictions Community Activity;Driving;Occupation    Stability/Clinical Decision Making Evolving/Moderate complexity    Clinical Decision Making Moderate    Rehab Potential Good    PT Frequency 2x / week    PT Duration 8 weeks    PT Treatment/Interventions ADLs/Self Care Home Management;Aquatic Therapy;Canalith Repostioning;Cryotherapy;Moist Heat;Gait training;DME Instruction;Stair training;Functional mobility training;Therapeutic activities;Therapeutic exercise;Balance training;Neuromuscular re-education;Patient/family education;Manual techniques;Passive range of motion;Dry  needling;Energy conservation;Vestibular    PT Next Visit Plan [facility], DVA, assess stairs and 6 minute walk test of endurance and reset baselines.  Initiate HEP focusing on neck ROM/stretches, VOR and habituation, walking program.  I would like to try dry needling with her.    Consulted and Agree with Plan of Care Patient           Patient will benefit from skilled therapeutic intervention in order to improve the following deficits and impairments:  Decreased activity tolerance, Decreased balance, Decreased endurance, Decreased range of motion, Decreased strength, Difficulty walking, Dizziness, Pain  Visit Diagnosis: Dizziness and giddiness  Cervicalgia  Other symptoms and signs involving the nervous system  Difficulty in walking, not elsewhere classified  Muscle weakness (generalized)     Problem List Patient Active Problem List   Diagnosis Date Noted  . Tachycardia 06/14/2020  . Shortness of breath on exertion 06/14/2020  . Memory loss 06/14/2020  . Brain fog 06/14/2020  . Benign paroxysmal positional vertigo due to bilateral vestibular disorder 06/14/2020  . Tinnitus of both ears 06/14/2020  . Physical deconditioning 06/14/2020  . Essential hypertension 05/16/2020  . Stress headache 05/16/2020  . SOB (shortness of breath) 05/16/2020  . Immunization reaction 05/16/2020  . Neck pain 03/31/2018  . Viral URI with cough 04/25/2017  . Abscess 12/27/2016  . Left anterior shoulder pain 06/28/2015  . Skin infection 01/12/2015  . Breast pain 01/12/2015  . UTI (urinary tract infection) 11/16/2014  . Weight gain 11/16/2014  . Bilateral knee pain 09/21/2014  . Thyromegaly 09/21/2014  . Obesity (BMI 30-39.9) 06/21/2014  . Edema 04/07/2014  . Maxillary sinusitis 11/07/2013  . Elevated BP 11/07/2013  . Insomnia 07/28/2013  . Epigastric pain 12/06/2012  . Hot flushes, perimenopausal 12/06/2012  . IBS (irritable bowel syndrome) 12/06/2012  . Fatigue 12/06/2012  . Family  hx of colon cancer in grandparent 12/06/2012  . Screening for malignant neoplasm of the cervix 07/22/2012  . Routine general medical examination at a health care facility 07/22/2012  . Anxiety and depression 07/22/2012  . Eating disorder 07/22/2012  . GERD (gastroesophageal reflux disease) 07/22/2012  . Fibromyalgia 07/22/2012    Dierdre Highman, PT, DPT 07/26/20    1:11 PM    [facility] [address] [place], [place], [address] Phone: [phone]   Fax:  [phone]  Name: Suzanne Singh MRN: [id number] Date of Birth: Apr 19, 1966

## 2020-08-07 ENCOUNTER — Encounter: Payer: BC Managed Care – PPO | Admitting: Counselor

## 2020-08-08 ENCOUNTER — Other Ambulatory Visit: Payer: Self-pay

## 2020-08-08 ENCOUNTER — Encounter: Payer: Self-pay | Admitting: Allergy

## 2020-08-08 ENCOUNTER — Ambulatory Visit (INDEPENDENT_AMBULATORY_CARE_PROVIDER_SITE_OTHER): Payer: BC Managed Care – PPO | Admitting: Allergy

## 2020-08-08 VITALS — BP 136/90 | HR 96 | Temp 98.7°F | Resp 20 | Ht 69.5 in | Wt 216.2 lb

## 2020-08-08 DIAGNOSIS — J4520 Mild intermittent asthma, uncomplicated: Secondary | ICD-10-CM | POA: Diagnosis not present

## 2020-08-08 DIAGNOSIS — J3089 Other allergic rhinitis: Secondary | ICD-10-CM

## 2020-08-08 DIAGNOSIS — H1013 Acute atopic conjunctivitis, bilateral: Secondary | ICD-10-CM | POA: Diagnosis not present

## 2020-08-08 DIAGNOSIS — T50B95D Adverse effect of other viral vaccines, subsequent encounter: Secondary | ICD-10-CM

## 2020-08-08 DIAGNOSIS — T50Z95D Adverse effect of other vaccines and biological substances, subsequent encounter: Secondary | ICD-10-CM

## 2020-08-08 NOTE — Patient Instructions (Addendum)
Adverse reaction following Covid vaccine  - your symptoms following Moderna #1 dose are consistent with a robust immune response  - with having positive Covid ab IgG this typically indicates a past illness or exposure.  It is very likely that you had exposure and were asymptomatic and developed antibodies.  Those who have either had Covid illness or exposure or previous vaccine can have a big immune response to the vaccine that can involve fever, body aches/chills/sweat and generalized fatigue.   However you have had persistent symptoms with migraines, ringing in ears, dizziness which is likely compounded by your environmental allergies  - I do not believe you have any contraindications to receiving future Covid vaccines and would recommend you receive Covid vaccine to reach full protection.    - we have a Covid vaccine clinic on 08/24/20 and 08/27/20 in [place] where you could receive Pfizer vaccine in office in monitored setting  Allergies  - environmental allergy testing is positive to grass pollens, weed pollens, tree pollens, molds, dust mites.  Allergen avoidance measures provided  - recommend performing nasal saline rinses to help flush out the sinuses  - continue Flonase 2 sprays each nostril daily as this time to help decrease nasal congestion   - continue Allegra 180mg  daily  - for itchy/watery/red eyes recommend use of Pataday 1 drop each eye daily as needed  Reactive airway  - possible asthma with improvement in symptoms with albuterol use  - lung function testing is normal  - have access to albuterol inhaler 2 puffs every 4-6 hours as needed for cough/wheeze/shortness of breath/chest tightness.  May use 15-20 minutes prior to activity.   Monitor frequency of use.    Follow-up for covid vaccine administration.  You will be contacted by my office in regards to further recommendations after I discuss with my colleagues

## 2020-08-08 NOTE — Progress Notes (Signed)
New Patient Note  RE: Suzanne Singh MRN: [id number] DOB: 07-17-1966 Date of Office Visit: 08/08/2020  Referring provider: Ivonne Andrew, NP Primary care provider: Zola Button, Grayling Congress, DO  Chief Complaint: Reaction to Covid vaccine  History of present illness: Suzanne Singh is a 54 y.o. female presenting today for consultation for adverse vaccine reaction.  She states she had a "major reaction" to Covid vaccine after receiving Moderna on 04/25/20.  She states immediately after getting the injection she felt an "electric shock" feeling in her injection arm.  She developed fever up to 105, bony aches, chills, sweats that evening.  She also reports having dizziness, migraines and ringing in the ears that started that evening.  She also reports tingling and itching of her opposite hand (vaccine given in left arm).  She still reports having migraines and ringing in the ears.    She notes developing a hive rash several weeks later that was itchy..  Pictures show a hyperpigmented type rash.  She states her menstral cycle stopped as well and just restarted again several days ago.  She states she had not had Covid illness before.  Her PCP recommended she have Covid test due to her symptoms after the vaccine and it was negative; that was a self swab at a pharmacy.  Her PCP did check her covid antibody status about a week or 2 later and she did have detectable antibodies.  Per patient it was unclear whether or not the antibodies were from the vaccine or from illness.  She did receive prednisone from PCP to help manage her symptoms.    She has been taking allegra and bendryl.  She states she has always needed to take antihistamines for allergy symptom control. She has been taking flonase that she states has helped.  She states her eyes are constantly watering now.  She also has been taking albuterol due to chest heaviness.   She has not performed nasal saline rinses.    As a child she reports having  environmental allergies with puffy eyes, nasal drianage, nose bleeds.  She did a course of allergen immunotherapy as a child.  She states she was very sick with allergies as a child and could have had asthma as a child but did not have a diagnosis.    Review of systems: Review of Systems  Constitutional: Negative.   HENT:       See HPI  Eyes:       See HPI  Respiratory:       See HPI  Cardiovascular: Negative.   Gastrointestinal: Negative.   Musculoskeletal: Negative.   Skin: Negative.   Neurological:       See HPI    All other systems negative unless noted above in HPI  Past medical history: Past Medical History:  Diagnosis Date  . Arrhythmia   . Arthritis   . Depression   . Eating disorder   . Fibromyalgia   . GERD (gastroesophageal reflux disease)   . IBS (irritable bowel syndrome)   . Migraine   . Seizures (HCC)     Past surgical history: History reviewed. No pertinent surgical history.  Family history:  Family History  Problem Relation Age of Onset  . Early death Sister   . Alcohol abuse Mother   . Arthritis Mother   . Lung cancer Mother   . Hypertension Mother   . Mental illness Mother   . Alcohol abuse Father   . Heart disease Father   .  Hypertension Father   . Diabetes Father   . Colon cancer Father   . Arthritis Maternal Grandmother   . Hypertension Maternal Grandmother   . Breast cancer Maternal Grandmother   . Stroke Maternal Grandfather   . Hypertension Maternal Grandfather   . Heart disease Paternal Grandmother   . Hypertension Paternal Grandmother   . Colon cancer Paternal Grandfather   . Heart disease Paternal Grandfather   . Hypertension Paternal Grandfather   . Kidney disease Paternal Grandfather   . Diabetes Paternal Grandfather     Social history: Lives in a home without carpeting with gas heating and central cooling.  No pets in the home.  Cats outside the home.  No concern for water damage, mildew or roaches in the home.  She is  a Runner, broadcasting/film/video.  Denies a smoking history.  Medication List: Current Outpatient Medications  Medication Sig Dispense Refill  . albuterol (PROAIR HFA) 108 (90 Base) MCG/ACT inhaler Inhale 2 puffs into the lungs every 6 (six) hours as needed for wheezing or shortness of breath. 18 g 3  . clonazePAM (KLONOPIN) 0.5 MG tablet TAKE 1 TABLET(0.5 MG) BY MOUTH TWICE DAILY AS NEEDED FOR ANXIETY 30 tablet 0  . cyclobenzaprine (FLEXERIL) 10 MG tablet Take 1 tablet (10 mg total) by mouth 3 (three) times daily as needed for muscle spasms. 30 tablet 0  . diclofenac sodium (VOLTAREN) 1 % GEL Apply 4 g topically 4 (four) times daily. 100 g 2  . FLUoxetine (PROZAC) 40 MG capsule Take 1 capsule (40 mg total) by mouth daily. 90 capsule 3  . fluticasone (FLONASE) 50 MCG/ACT nasal spray Place 2 sprays into both nostrils daily. 16 g 6  . Ibuprofen-Famotidine 800-26.6 MG TABS Take one tablet po up to three times per day for joint pain. 90 tablet 3  . losartan (COZAAR) 50 MG tablet Take 1 tablet (50 mg total) by mouth daily. 30 tablet 3  . meloxicam (MOBIC) 7.5 MG tablet 1-2 po qd prn 60 tablet 2  . cyclobenzaprine (FLEXERIL) 5 MG tablet Take 1 tablet (5 mg total) by mouth 3 (three) times daily as needed for muscle spasms. 90 tablet 0  . fluticasone (FLONASE) 50 MCG/ACT nasal spray Place 2 sprays into both nostrils daily. 16 g 6  . Olopatadine HCl (PATADAY) 0.2 % SOLN Place 1 drop into both eyes daily as needed. 2.5 mL 5  . predniSONE (DELTASONE) 10 MG tablet TAKE 3 TABLETS PO QD FOR 3 DAYS THEN TAKE 2 TABLETS PO QD FOR 3 DAYS THEN TAKE 1 TABLET PO QD FOR 3 DAYS THEN TAKE 1/2 TAB PO QD FOR 3 DAYS (Patient not taking: Reported on 07/26/2020) 20 tablet 0   Current Facility-Administered Medications  Medication Dose Route Frequency Provider Last Rate Last Admin  . lidocaine (PF) (XYLOCAINE) 1 % injection 2 mL  2 mL Other Once Tyrell Antonio, MD        Known medication allergies: Allergies  Allergen Reactions  . Ambien Cr  [Zolpidem Tartrate Er]     Seizure   . Cymbalta [Duloxetine Hcl]     Seizure   . Celebrex [Celecoxib]     Hives   . Hydrocodone Itching  . Latex Rash    Adhesive     Physical examination: Blood pressure 136/90, pulse 96, temperature 98.7 F (37.1 C), resp. rate 20, height 5' 9.5" (1.765 m), weight 216 lb 3.2 oz (98.1 kg), SpO2 98 %.  General: Alert, interactive, in no acute distress. HEENT: PERRLA, TMs pearly  gray, turbinates minimally edematous without discharge, post-pharynx non erythematous. Neck: Supple without lymphadenopathy. Lungs: Clear to auscultation without wheezing, rhonchi or rales. {no increased work of breathing. CV: Normal S1, S2 without murmurs. Abdomen: Nondistended, nontender. Skin: Warm and dry, without lesions or rashes. Extremities:  No clubbing, cyanosis or edema. Neuro:   Grossly intact.  Diagnositics/Labs  Spirometry: FEV1: 2.52L 92%, FVC: 4.9L 143%, ratio consistent with nonobstructive pattern  Allergy testing: environmental allergy skin prick testing is positive to grass pollens, weed pollens, tree pollens, molds, both dust mites. Allergy testing results were read and interpreted by provider, documented by clinical staff.   Assessment and plan:   Adverse reaction following Covid vaccine  - your symptoms following Moderna #1 dose are consistent with a robust immune response  - with having positive Covid ab IgG this typically indicates a past illness or exposure.  It is very likely that you had exposure and were asymptomatic and developed antibodies.  Those who have either had Covid illness or exposure or previous vaccine can have a big immune response to the vaccine that can involve fever, body aches/chills/sweat and generalized fatigue.   However you have had persistent symptoms with migraines, ringing in ears, dizziness which is likely compounded by your environmental allergies  - I do not believe you have any contraindications to receiving future  Covid vaccines and would recommend you receive Covid vaccine to reach full protection.    - we have a Covid vaccine clinic on 08/24/20 and 08/27/20 in [place] where you could receive Pfizer vaccine in office in monitored setting  Allergic rhinitis with conjunctivitis  - environmental allergy testing is positive to grass pollens, weed pollens, tree pollens, molds, dust mites.  Allergen avoidance measures provided  - recommend performing nasal saline rinses to help flush out the sinuses  - continue Flonase 2 sprays each nostril daily as this time to help decrease nasal congestion   - continue Allegra 180mg  daily  - for itchy/watery/red eyes recommend use of Pataday 1 drop each eye daily as needed  Reactive airway  - possible asthma with improvement in symptoms with albuterol use  - lung function testing is normal  - have access to albuterol inhaler 2 puffs every 4-6 hours as needed for cough/wheeze/shortness of breath/chest tightness.  May use 15-20 minutes prior to activity.   Monitor frequency of use.    Follow-up for covid vaccine administration.   I appreciate the opportunity to take part in [place] care. Please do not hesitate to contact me with questions.  Sincerely,   [place], MD Allergy/Immunology [facility]

## 2020-08-10 MED ORDER — OLOPATADINE HCL 0.2 % OP SOLN: 1.0000 [drp] | Freq: Every day | OPHTHALMIC | 5 refills | Status: AC | PRN

## 2020-08-12 ENCOUNTER — Other Ambulatory Visit: Payer: Self-pay | Admitting: Family Medicine

## 2020-08-14 ENCOUNTER — Encounter: Payer: BC Managed Care – PPO | Admitting: Counselor

## 2020-09-04 ENCOUNTER — Other Ambulatory Visit: Payer: Self-pay

## 2020-09-04 ENCOUNTER — Encounter: Payer: Self-pay | Admitting: Internal Medicine

## 2020-09-04 ENCOUNTER — Ambulatory Visit (INDEPENDENT_AMBULATORY_CARE_PROVIDER_SITE_OTHER): Payer: BC Managed Care – PPO | Admitting: Internal Medicine

## 2020-09-04 VITALS — BP 120/86 | HR 64 | Ht 69.0 in | Wt 216.0 lb

## 2020-09-04 DIAGNOSIS — R0600 Dyspnea, unspecified: Secondary | ICD-10-CM | POA: Diagnosis not present

## 2020-09-04 DIAGNOSIS — I10 Essential (primary) hypertension: Secondary | ICD-10-CM

## 2020-09-04 DIAGNOSIS — R002 Palpitations: Secondary | ICD-10-CM | POA: Diagnosis not present

## 2020-09-04 NOTE — Patient Instructions (Signed)
Medication Instructions:  Your physician recommends that you continue on your current medications as directed. Please refer to the Current Medication list given to you today.  *If you need a refill on your cardiac medications before your next appointment, please call your pharmacy*   Lab Work: None ordered.  If you have labs (blood work) drawn today and your tests are completely normal, you will receive your results only by:  MyChart Message (if you have MyChart) OR  A paper copy in the mail If you have any lab test that is abnormal or we need to change your treatment, we will call you to review the results.   Testing/Procedures: Your physician has requested that you have an echocardiogram. Echocardiography is a painless test that uses sound waves to create images of your heart. It provides your doctor with information about the size and shape of your heart and how well your hearts chambers and valves are working. This procedure takes approximately one hour. There are no restrictions for this procedure.     Follow-Up: At [facility], you and your health needs are our priority.  As part of our continuing mission to provide you with exceptional heart care, we have created designated Provider Care Teams.  These Care Teams include your primary Cardiologist (physician) and Advanced Practice Providers (APPs -  Physician Assistants and Nurse Practitioners) who all work together to provide you with the care you need, when you need it.  We recommend signing up for the patient portal called "MyChart".  Sign up information is provided on this After Visit Summary.  MyChart is used to connect with patients for Virtual Visits (Telemedicine).  Patients are able to view lab/test results, encounter notes, upcoming appointments, etc.  Non-urgent messages can be sent to your provider as well.   To learn more about what you can do with MyChart, go to [email or web address].    Your next appointment:   10/04/2020 at 345pm with Dr Graciela Husbands

## 2020-09-04 NOTE — Progress Notes (Signed)
ELECTROPHYSIOLOGY CONSULT NOTE  Patient ID: Suzanne Singh, MRN: [id number], DOB/AGE: Mar 29, 1966 54 y.o. Admit date: (Not on file) Date of Consult: 09/04/2020  Primary Physician: Zola Button, Grayling Congress, DO Primary Cardiologist: new     Suzanne Singh is a 54 y.o. female who is being seen today for the evaluation of POTS at the request of Dr Lowne/COVID clinic.    HPI Suzanne Singh is a 54 y.o. female referred because of symptoms that developed after the moderna no. 1 4/21.  She began having symptoms of headaches exercise intolerance manifested by dyspnea along with tachypalpitations brain fog and fatigue.  Developed severe tinnitus enough that it awakens her at night.  She is started on losartan which has helped with her blood pressure.  No edema.  No shower intolerance.  Has seen neurology and allergist.  History of anxiety for which she was taking Prozac the dose of which has been uptitrated.  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    Date Cr K Hgb  7/21 0.77 4.4 11.9            Past Medical History:  Diagnosis Date  . Arrhythmia   . Arthritis   . Depression   . Eating disorder   . Fibromyalgia   . GERD (gastroesophageal reflux disease)   . IBS (irritable bowel syndrome)   . Migraine   . Seizures (HCC)       Surgical History: No past surgical history on file.   Home Meds: Current Meds  Medication Sig  . albuterol (PROAIR HFA) 108 (90 Base) MCG/ACT inhaler Inhale 2 puffs into the lungs every 6 (six) hours as needed for wheezing or shortness of breath.  . clonazePAM (KLONOPIN) 0.5 MG tablet TAKE 1 TABLET(0.5 MG) BY MOUTH TWICE DAILY AS NEEDED FOR ANXIETY  . cyclobenzaprine (FLEXERIL) 10 MG tablet Take 1 tablet (10 mg total) by mouth 3 (three) times daily as needed for muscle spasms.  . cyclobenzaprine (FLEXERIL) 5 MG tablet Take 1 tablet (5 mg total) by mouth 3 (three) times daily as needed for muscle  spasms.  . diclofenac sodium (VOLTAREN) 1 % GEL Apply 4 g topically 4 (four) times daily.  Marland Kitchen FLUoxetine (PROZAC) 40 MG capsule Take 1 capsule (40 mg total) by mouth daily.  . fluticasone (FLONASE) 50 MCG/ACT nasal spray Place 2 sprays into both nostrils daily.  . fluticasone (FLONASE) 50 MCG/ACT nasal spray Place 2 sprays into both nostrils daily.  . Ibuprofen-Famotidine 800-26.6 MG TABS Take one tablet po up to three times per day for joint pain.  Marland Kitchen losartan (COZAAR) 50 MG tablet Take 1 tablet (50 mg total) by mouth daily.  . meloxicam (MOBIC) 7.5 MG tablet TAKE 1 TO 2 TABLETS BY MOUTH EVERY DAY AS NEEDED  . Olopatadine HCl (PATADAY) 0.2 % SOLN Place 1 drop into both eyes daily as needed.  . predniSONE (DELTASONE) 10 MG tablet TAKE 3 TABLETS PO QD FOR 3 DAYS THEN TAKE 2 TABLETS PO QD FOR 3 DAYS THEN TAKE 1 TABLET PO QD FOR 3 DAYS THEN TAKE 1/2 TAB PO QD FOR 3 DAYS   Current Facility-Administered Medications for the 09/04/20 encounter (Office Visit) with Duke Salvia, MD  Medication  . lidocaine (PF) (XYLOCAINE) 1 % injection 2 mL    Allergies:  Allergies  Allergen Reactions  . Ambien Cr [Zolpidem Tartrate Er]     Seizure   . Cymbalta [Duloxetine Hcl]  Seizure   . Celebrex [Celecoxib]     Hives   . Hydrocodone Itching  . Latex Rash    Adhesive    Social History   Socioeconomic History  . Marital status: Married    Spouse name: Not on file  . Number of children: 2  . Years of education: Not on file  . Highest education level: Not on file  Occupational History  . Not on file  Tobacco Use  . Smoking status: Never Smoker  . Smokeless tobacco: Never Used  Substance and Sexual Activity  . Alcohol use: No    Alcohol/week: 0.0 standard drinks    Comment: wine on occasion  . Drug use: No  . Sexual activity: Not on file  Other Topics Concern  . Not on file  Social History Narrative   Middle school Retail buyer, [employer]. Currently separated as of  February 2014. Divorced pending this year.   2 sons      Exercise-no   Social Determinants of Health   Financial Resource Strain:   . Difficulty of Paying Living Expenses: Not on file  Food Insecurity:   . Worried About Programme researcher, broadcasting/film/video in the Last Year: Not on file  . Ran Out of Food in the Last Year: Not on file  Transportation Needs:   . Lack of Transportation (Medical): Not on file  . Lack of Transportation (Non-Medical): Not on file  Physical Activity:   . Days of Exercise per Week: Not on file  . Minutes of Exercise per Session: Not on file  Stress:   . Feeling of Stress : Not on file  Social Connections:   . Frequency of Communication with Friends and Family: Not on file  . Frequency of Social Gatherings with Friends and Family: Not on file  . Attends Religious Services: Not on file  . Active Member of Clubs or Organizations: Not on file  . Attends Banker Meetings: Not on file  . Marital Status: Not on file  Intimate Partner Violence:   . Fear of Current or Ex-Partner: Not on file  . Emotionally Abused: Not on file  . Physically Abused: Not on file  . Sexually Abused: Not on file     Family History  Problem Relation Age of Onset  . Early death Sister   . Alcohol abuse Mother   . Arthritis Mother   . Lung cancer Mother   . Hypertension Mother   . Mental illness Mother   . Alcohol abuse Father   . Heart disease Father   . Hypertension Father   . Diabetes Father   . Colon cancer Father   . Arthritis Maternal Grandmother   . Hypertension Maternal Grandmother   . Breast cancer Maternal Grandmother   . Stroke Maternal Grandfather   . Hypertension Maternal Grandfather   . Heart disease Paternal Grandmother   . Hypertension Paternal Grandmother   . Colon cancer Paternal Grandfather   . Heart disease Paternal Grandfather   . Hypertension Paternal Grandfather   . Kidney disease Paternal Grandfather   . Diabetes Paternal Grandfather      ROS:   Please see the history of present illness.     All other systems reviewed and negative.    Physical Exam:  Blood pressure 120/86, pulse 64, height 5\' 9"  (1.753 m), weight 216 lb (98 kg), SpO2 97 %. General: Well developed, well nourished female in no acute distress. Head: Normocephalic, atraumatic, sclera non-icteric, no xanthomas, nares  are without discharge. EENT: normal  Lymph Nodes:  none Neck: Negative for carotid bruits. JVD not elevated. Back:without scoliosis kyphosis*  Lungs: Clear bilaterally to auscultation without wheezes, rales, or rhonchi. Breathing is unlabored. Heart: RRR with S1 S2. No  murmur . No rubs, or gallops appreciated. Abdomen: Soft, non-tender, non-distended with normoactive bowel sounds. No hepatomegaly. No rebound/guarding. No obvious abdominal masses. Msk:  Strength and tone appear normal for age. Extremities: No clubbing or cyanosis. No + edema.  Distal pedal pulses are 2+ and equal bilaterally. Skin: Warm and Dry Neuro: Alert and oriented X 3. CN III-XII intact Grossly normal sensory and motor function . Psych:  Responds to questions appropriately with a normal affect.      Labs: Cardiac Enzymes No results for input(s): CKTOTAL, CKMB, TROPONINI in the last 72 hours. CBC Lab Results  Component Value Date   WBC 5.4 06/18/2020   HGB 11.9 06/18/2020   HCT 37.1 06/18/2020   MCV 90 06/18/2020   PLT 338 06/18/2020   PROTIME: No results for input(s): LABPROT, INR in the last 72 hours. Chemistry No results for input(s): NA, K, CL, CO2, BUN, CREATININE, CALCIUM, PROT, BILITOT, ALKPHOS, ALT, AST, GLUCOSE in the last 168 hours.  Invalid input(s): LABALBU Lipids Lab Results  Component Value Date   CHOL 191 02/10/2019   HDL 63.50 02/10/2019   LDLCALC 118 (H) 02/10/2019   TRIG 51.0 02/10/2019   BNP No results found for: PROBNP Thyroid Function Tests: No results for input(s): TSH, T4TOTAL, T3FREE, THYROIDAB in the last 72 hours.  Invalid input(s):  FREET3 Miscellaneous Lab Results  Component Value Date   DDIMER 0.25 05/15/2020    Radiology/Studies:  No results found.  EKG: *sinus    Assessment and Plan:   Hypertension  Palpitations  Brain fog  ?  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 Symptoms of exercise intolerance and tachypalpitations could be dysautonomic, but hard to say based on todays measurements.  Still important to clarify HR with exercise, (As above) and have recommended that she use a pulse Ox.Marland Kitchen if tachycardia demonstrated would use a Zio patch to assess tachycardia burden and HR variability.  I did a brief search on post vaccine POTS , and there was a single case report w Pfizer vaccine.  There have been discussions about POTS post vaccine-not uncommon.  That she doesn't meet criteria for POTS , she does have some modest symptoms of exercise intolerance.  Perhaps ...,   Discussed empiric effort to treat tachypalps by switching her losartan to BB, but for now she would like to defer  Discussed the importance of getting sleep as that may be contributing to the fatigue and brain fog.  She will followup with her PCP  Discussed [facility]    Will get Zio for palpitations  Change losartan to BB following the patch  Followup withPCP re sleep issues    Sherryl Manges

## 2020-09-14 ENCOUNTER — Ambulatory Visit: Payer: BC Managed Care – PPO

## 2020-09-18 ENCOUNTER — Encounter: Payer: BC Managed Care – PPO | Admitting: Counselor

## 2020-09-25 ENCOUNTER — Encounter: Payer: BC Managed Care – PPO | Admitting: Counselor

## 2020-09-25 ENCOUNTER — Institutional Professional Consult (permissible substitution): Payer: BC Managed Care – PPO | Admitting: Pulmonary Disease

## 2020-10-04 ENCOUNTER — Telehealth: Payer: BC Managed Care – PPO | Admitting: Internal Medicine

## 2020-10-04 DIAGNOSIS — R000 Tachycardia, unspecified: Secondary | ICD-10-CM

## 2020-10-04 DIAGNOSIS — R002 Palpitations: Secondary | ICD-10-CM | POA: Insufficient documentation

## 2020-10-11 ENCOUNTER — Encounter (HOSPITAL_COMMUNITY): Payer: Self-pay

## 2020-10-11 ENCOUNTER — Other Ambulatory Visit (HOSPITAL_COMMUNITY): Payer: BC Managed Care – PPO

## 2020-10-11 ENCOUNTER — Other Ambulatory Visit: Payer: Self-pay | Admitting: Family Medicine

## 2020-10-11 DIAGNOSIS — I10 Essential (primary) hypertension: Secondary | ICD-10-CM

## 2020-10-11 NOTE — Progress Notes (Signed)
Verified appointment "no show" status with M. Willilams at 16:00.

## 2020-10-12 ENCOUNTER — Encounter: Payer: Self-pay | Admitting: Internal Medicine

## 2020-10-12 ENCOUNTER — Encounter (HOSPITAL_COMMUNITY): Payer: Self-pay | Admitting: Internal Medicine

## 2020-10-30 ENCOUNTER — Encounter: Payer: Self-pay | Admitting: Counselor

## 2020-10-30 ENCOUNTER — Ambulatory Visit (INDEPENDENT_AMBULATORY_CARE_PROVIDER_SITE_OTHER): Payer: BC Managed Care – PPO | Admitting: Counselor

## 2020-10-30 ENCOUNTER — Ambulatory Visit: Payer: BC Managed Care – PPO

## 2020-10-30 ENCOUNTER — Other Ambulatory Visit: Payer: Self-pay

## 2020-10-30 DIAGNOSIS — T50B95A Adverse effect of other viral vaccines, initial encounter: Secondary | ICD-10-CM

## 2020-10-30 DIAGNOSIS — F09 Unspecified mental disorder due to known physiological condition: Secondary | ICD-10-CM

## 2020-10-30 DIAGNOSIS — F418 Other specified anxiety disorders: Secondary | ICD-10-CM

## 2020-10-30 NOTE — Progress Notes (Signed)
   Psychometrist Note   Cognitive testing was administered to [facility] by Lamar Benes, B.S. Environmental education officer) under the supervision of Alphonzo Severance, Psy.D., ABN. Ms. Yeaman was able to tolerate all test procedures. Dr. Nicole Kindred met with the patient as needed to manage any emotional reactions to the testing procedures. Rest breaks were offered.    The battery of tests administered was selected by Dr. Nicole Kindred with consideration to the patient's current level of functioning, the nature of her symptoms, emotional and behavioral responses during the interview, level of literacy, observed level of motivation/effort, and the nature of the referral question. This battery was communicated to the psychometrist. Communication between Dr. Nicole Kindred and the psychometrist was ongoing throughout the evaluation and Dr. Nicole Kindred was immediately accessible at all times. Dr. Nicole Kindred provided supervision to the technician on the date of this service, to the extent necessary to assure the quality of all services provided.    Ms. Longstreth will return in approximately one week for an interactive feedback session with Dr. Nicole Kindred, at which time test performance, clinical impressions, and treatment recommendations will be reviewed in detail. The patient understands she can contact our office should she require our assistance before this time.   A total of 140 minutes of billable time were spent with [facility] by the technician, including test administration and scoring time. Billing for these services is reflected in Dr. Les Pou note.   This note reflects time spent with the psychometrician and does not include test scores, clinical history, or any interpretations made by Dr. Nicole Kindred. The full report will follow in a separate note.

## 2020-10-30 NOTE — Progress Notes (Signed)
NEUROPSYCHOLOGICAL TEST SCORES Suzanne Singh  Patient Name: Suzanne Singh MRN: [id number] Date of Birth: 21-Jul-1966 Age: 54 y.o. Education: 18 years  Measurement properties of test scores: IQ, Index, and Standard Scores (SS): Mean = 100; Standard Deviation = 15 Scaled Scores (Ss): Mean = 10; Standard Deviation = 3 Z scores (Z): Mean = 0; Standard Deviation = 1 T scores (T); Mean = 50; Standard Deviation = 10  TEST SCORES:    Note: This summary of test scores accompanies the interpretive report and should not be interpreted by unqualified individuals or in isolation without reference to the report. Test scores are relative to age, gender, and educational history as available and appropriate.   Performance Validity        ACS: Raw Descriptor      Word Choice: 50 Within Expectation      MSVT: Raw Descriptor      IR 100 Within Expectation      DR 100 Within Expectation      CNS 100 Within Expectation      PA 90 ---      FR 50 ---      The Dot Counting Test: Raw Descriptor      E-Score 9 Within Expectation      Embedded Measures: Raw Descriptor      RBANS Effort Index: 1 Within Expectation      WAIS-IV Reliable Digit Span 8 Within Expectation      WAIS-IV Reliable Digit Span Revised 12 Within Expectation      Expected Functioning        Wide Range Achievement Test (Word Reading): Standard/Scaled Score Percentile       Word Reading 102 55      Cognitive Testing        RBANS, Form : Standard/Scaled Score Percentile  Total Score 85 16  Immediate Memory 94 34      List Learning 10 50      Story Memory 8 25  Visuospatial/Constructional 84 14      Figure Copy   (18) 10 50      Judgment of Line Orientation   (11) --- 3-9  Language 102 55      Picture Naming --- 17-25      Semantic Fluency 11 63  Attention 91 27      Digit Span 10 50      Coding 7 16  Delayed Memory 75 5      List Recall   (5) --- 26-50      List Recognition   (17) --- <2      Story Recall    (7) 7 16      Figure Recall   (9) 6 9      Wechsler Adult Intelligence Scale - IV: Standard/Scaled Score Percentile  Working Memory Index 92 30      Digit Span 8 25          Digit Span Forward 9 37          Digit Span Backward 8 25          Digit Span Sequencing 8 25      Arithmetic 9 37  Processing Speed Index 86 18      Symbol Search 4 2      Coding 11 63      Neuropsychological Assessment Battery (Language Module): T-score Percentile      Naming   (29) 39 14      Verbal Fluency:  T-score Percentile      Controlled Oral Word Association (F-A-S) 45 31      Semantic Fluency (Animals) 45 31      Trail Making Test: T-Score Percentile      Part A 63 91      Part B 41 18      Modified Wisconsin Card Sorting Test (MWCST): Standard/T-Score Percentile      Number of Categories Correct 26 1      Number of Perseverative Errors 45 31      Number of Total Errors 29 2      Percent Perseverative Errors 50 50  Executive Function Composite 76 5      Boston Diagnostic Aphasia Exam: Raw Score Scaled Score      Complex Ideational Material 12 12      Rating Scales         Raw Score Descriptor  Patient Health Questionnaire - 9 12 Moderate  GAD-7 7 Mild      Personality Assessment Inventory T-Score % Complete      Inconsistency 67 100      Infrequency 55 100      Negative Impression Management 59 100      Positive Impression Management 54 100      Somatic Complaints 62 100      Anxiety 63 100      Anxiety Related Disorders 72 100      Depression 78 100      Mania 46 100      Paranoia 68 100      Schizophrenia 55 100      Borderline Features 50 100      Antisocial Features 40 100      Alcohol Problems 45 100      Drug Problems 60 100      Aggression 42 100      Suicidal Ideation 56 100      Stress 53 100      Nonsupport 75 100      Treatment Rejection 55 100      Dominance 49 100      Warmth 35 100   Laura-Lee Villegas V. Roseanne Reno PsyD, ABN Clinical Neuropsychologist
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
## 2020-11-05 ENCOUNTER — Other Ambulatory Visit: Payer: Self-pay

## 2020-11-05 ENCOUNTER — Other Ambulatory Visit (HOSPITAL_COMMUNITY): Payer: BC Managed Care – PPO

## 2020-11-05 ENCOUNTER — Ambulatory Visit (HOSPITAL_COMMUNITY): Payer: BC Managed Care – PPO | Attending: Cardiology

## 2020-11-05 DIAGNOSIS — R002 Palpitations: Secondary | ICD-10-CM | POA: Diagnosis present

## 2020-11-05 DIAGNOSIS — R0600 Dyspnea, unspecified: Secondary | ICD-10-CM | POA: Diagnosis present

## 2020-11-05 LAB — ECHOCARDIOGRAM COMPLETE
Area-P 1/2: 3.91 cm2
S' Lateral: 2.5 cm

## 2020-11-06 ENCOUNTER — Encounter: Payer: BC Managed Care – PPO | Admitting: Counselor

## 2020-11-07 NOTE — Progress Notes (Signed)
NEUROPSYCHOLOGICAL EVALUATION [facility]  Patient Name: Suzanne Singh MRN: [id number] Date of Birth: [birth year] Age: 54 y.o. Education: 18 years  Clinical Impressions  Suzanne Singh is a 54 y.o., right-hand dominant, married woman with a history of depression, anxiety, allergies, arthritis, fibromyalgia, and IBS. She was essentially in her normal state of health until receiving the Moderna vaccine in May, 2021, at which time she developed a number of symptoms including fevers, chills, migraines, fatigue, and brain fog. She has recovered from the acute symptoms but continues to struggle with headaches, exercise intolerance, tachycardia, she is having high blood pressure, has aches, chills, diaphoresis, and brain fog. She is still functioning well and continues to work, up to 15 hours a day, although she finds it hard to keep up the pace. She admits that she has a tendency to fixate on her health and gets anxious about it. She is not sleeping well and only gets about 4 or 5 hours of sleep per night related to her symptoms and other issues.   Neurocognitive testing shows a fairly benign overall performance profile, albeit with a fairly consistent pattern of weak scores on measures involving visuospatial functioning and findings raising some consideration of executive control problems on measures primary to that domain. The former issue may be on the basis of premorbid strengths and weaknesses. There were no areas of very impaired or significantly deficient performance. On detailed assessment of emotional and psychological status, she reported significant tension, unhappiness, as well as pessimism and feelings of sadness, loss of interest in normal activities, and a loss of sense of pleasure in things that were previously enjoyed. She reported only some concerns about physical functioning and health matters, which is a bit out of sync with her report of multiple symptoms on interview. She scored in  the moderate range for depressive symptoms and the mild range for anxiety symptoms on conventional self-report measures.  [name] is thus manifesting what appears to be an active depressive disorder, in the context of her recent health challenges and additional stress related to COVID. The nature of any issues beyond that is unclear, although it does appear she is having some mild executive control problems. Factors to consider for some contribution include insufficient sleep, distraction related to pain from her headaches and other physical symptoms, fatigue, and cognitive sequelae of depression. The possibility of some contribution related to her vaccine reaction cannot be entirely ruled out, although I am unaware of any even low quality evidence suggesting neuromodulatory or other pertinent neurological changes following mRNA COVID vaccination.   Diagnostic Impressions: Depressive disorder Other symptoms and signs involving cognitive functions and awareness  Recommendations to be discussed with patient  Your performance and presentation on neuropsychological assessment was overall consistent with fairly benign performance. That is to say that you performed within the expected limits for you in most areas. This is reassuring that there is no very severe cognitive problem. You did have some scattered low scores on measures of visuospatial functioning, which could reflect preexisting strengths and weaknesses, and you also had a few scattered low scores that suggest the possibility of executive control problems. Executive control problems are the most common problem I encounter in patients who do not have a major underlying organic cause for their cognitive difficulties such as dementia, strokes, head injuries, or other major neurological problems.   Executive control is a higher order cognitive ability involved in regulating other cognitive resources. Much like the conductor of an orchestra coordinates  multiple  instruments to make music, executive capacities coordinate other lower-order skills (e.g., movement, language, attention) to form complex human behaviors. Individuals with executive control problems are often capable of doing most of the things they did before they were having problems, but they may not do so as effortlessly, efficiently, and consistently. These difficulties often manifest as problems tracking information, multitasking, and paying attention. Executive control problems often result in cognitive inefficiency and can present as "memory problems," because they decrease encoding and spontaneous retrieval of information.   In your case, I think that your executive control problems are most likely related to reversible causes. Namely, insufficient sleep, distraction related to your multiple medical conditions, ongoing fatigue, chronic effects of stress, and also what appears to be an active depressive disorder. You reported feelings of unhappiness, pessimism, and feelings of sadness and scored in the moderate range for depressive symptoms overall. Effective treatments for depression include psychotherapy and medication. You are already on medication for depression but you could consider seeing if titration and/or alternative treatment would be helpful. You could also consider adding psychotherapy.   Often times, individuals who are high functioning, have high expectations for themselves and others, and who are involved in intellectual activities are most sensitive to their cognitive changes. There are indications that others might describe you as a perfectionist, and thus this may be the case for you. I would encourage you to be kind to yourself, patient, and to do the best that you can, which is all any of us can do.   Avoid overfocusing on cognitive performance. Memory and cognition are notoriously fallible and if you are looking for cognitive problems, you are bound to find them. Once  someone gets worried about their memory and thinking, they may overfocus on how they are doing day-to-day, and then when normal day-to-day cognitive errors are made, this becomes a cause for more concern. This concern and anxiety then decreases focus from the task at hand, reducing concentration, causing more cognitive problems, and creating a vicious cycle. Rather than critiquing your performance, I would encourage you to remain present minded and focus on the task at hand. Perhaps most importantly, have reasonable expectations for yourself.  Healthy people forget things, lose focus, and do not perform 100% correctly all the time. Some cognitive errors are normal and are not necessarily a sign that there is something wrong with your brain.   In terms of your vaccine reaction, I can not rule out that this has had some impact on your memory and thinking abilities. Often times, physical symptoms can be greatly undermining to cognition because things like pain, fatigue, headaches and the like are distracting and draw ones attention inward from the word around them to focus on the problems they are having. I am not aware of any credible studies suggesting long-lasting cognitive sequelae following COVID vaccine administration, and thus I would expect your problems to continue to improve over time.   There are few things as disruptive to brain functioning as not getting a good night's sleep. For sleep, I recommend against using medications, which can have lingering sedating effects on the brain and rob your brain of restful REM sleep. Instead, consider trying some of the following sleep hygiene recommendations. They may not work at once and may take effort, but the effort you spend is likely to be rewarded with better sleep eventually:  . Stick to a sleep schedule of the same bedtime and wake time even on the weekends, which can help to  regulate your body's internal clock so that you fall asleep and stay asleep.   . Practice a relaxing bedtime ritual (conducted away from bright lights) which will help separate your sleep from stimulating activities and prepare your body to fall asleep when you go to bed.  . Avoid naps, especially in the afternoon.  . Evaluate your room and create conditions that will promote sleep such as keeping it cool (between 60 - 67 degrees), quiet, and free from any lights. Consider using blackout curtains, a "white noise" generator, or fan that will help mask any noises that might prevent you from going to sleep or awaken you during the night.  . Sleep on a comfortable mattress and pillows.  . Avoid bright light in the evening and excessive use of portable electronic devices right before bed that may contain light frequencies that can contribute to sleep problems.  . Avoid alcohol, cigarettes, or heavy meals in the evening. If you must eat, consume a light snack 45 minutes before bed.  . Use your bed only for sleep to strengthen the association between your bed and sleep.  . If you can't go to sleep within 30 minutes, go into another room and do something relaxing until you feel tired. Then, come back and try to go to sleep again for 30 minutes and repeat until sleep is achieved.  . Some people find over the counter melatonin to be helpful for sleep, which you could discuss with a pharmacist or prescribing provider.   Test Findings  Test scores are summarized in additional documentation associated with this encounter. Test scores are relative to age, gender, and educational history as available and appropriate. There were no concerns about performance validity as all findings fell within normal expectations.   General Intellectual Functioning/Achievement:  Performance on single word reading was average, which presents as a reasonable standard of comparison for the patient's cognitive test data.   Attention and Processing Efficiency: Performance on indicators of attention and working  memory was normal, with an average range score on the Working Memory Index of the WAIS-IV. Average scores were demonstrated on digit repetition forward, backward, digit resequencing in ascending order, and mental solving of arithmetical word problems.   Performance on indicators of processing speed was reasonable, albeit perhaps mildly reduced related to an extremely low score on one measure with a higher loading on visuospatial abilities. She performed at a reasonable low average level overall. Timed number symbol coding was low average to average.   Language: Performance on language measures was good, with average scores on timed indicators of associative verbal fluency and visual object confrontation naming.   Visuospatial Function: Ms. Gruetzmacher demonstrated scores suggesting visuospatial weakness across the test battery, including on one measure of processing speed, on visual memory, and also on judgment of angular line orientations, a measure primary to this domain. She did better on visuospatial construction with an average score on figure copy. My sense is she may have some preexisting weaknesses in this area.   Learning and Memory: Performance on learning and memory measures was inconsistent although adequate acquisition and retention of information across time was demonstrated in the verbal modality. The profile is inconsistent with a focal or primary memory problem and suggests more likely that executive control factors attenuated her performance.   In the verbal realm, immediate recall was average for a 10-item word list across four learning trials and for a brief short story. Delayed recall was average for the word list and low  average for the short story. Recognition of the words from the list versus false choices was extremely low, although that may have to do with difficulties choosing amongst alternatives (viewed as an aspect of executive control) and the measurement properties of the test.    In the visual realm, delayed recall for a modestly complex figure was unusually low.   Executive Functions: Performance on measures of executive abilities was mixed with an overall impression of some executive control problems. She performed at an unusually low level on the Modified [employer] EF Composite, more due to difficulties discerning solution categories than due to an inappropriate approach to the task (her perseverative errors score was average). Alternating sequencing of numbers and letters of the alphabet was low average. Generation of words in response to the letters F-A-S was average. Reasoning with verbal information was average on the Complex Ideational Material.   Rating Scale(s): Ms. Wayne reported moderate levels of depression symptoms and mild levels of anxiety symptoms on conventional self-report symptom scales. On detailed assessment with the PAI, she reported a number of difficulties consistent with a significant depressive experience. She reported experiencing tension, unhappiness, feelings of sadness, loss of interest in normal activities, and a loss of sense of pleasure in things that were previously enjoyed. Various stressors (both past and present) may have adversely affected her self-esteem and leave her feeling ineffectual and powerless. She reported fears and anxiety surrounding some situations and is probably seen by others as something of a perfectionist. She may be prone to rumination that makes decision making challenging. In contrast to her self-report and medical history, which features a number of somatic concerns, she reported only some concerns about physical functioning and health matters. This may suggest underreporting of physical concerns in the context of her clinical history. Although she appears to be quite distressed and aware of a need for help, her energy level, tension, and withdrawal may make it difficult for her to engage in treatment.      Bettye Boeck Roseanne Reno PsyD, ABN Clinical Neuropsychologist

## 2020-11-08 ENCOUNTER — Other Ambulatory Visit: Payer: Self-pay

## 2020-11-08 ENCOUNTER — Ambulatory Visit (INDEPENDENT_AMBULATORY_CARE_PROVIDER_SITE_OTHER): Payer: BC Managed Care – PPO | Admitting: Counselor

## 2020-11-08 ENCOUNTER — Encounter: Payer: Self-pay | Admitting: Counselor

## 2020-11-08 DIAGNOSIS — R4189 Other symptoms and signs involving cognitive functions and awareness: Secondary | ICD-10-CM

## 2020-11-08 DIAGNOSIS — F329 Major depressive disorder, single episode, unspecified: Secondary | ICD-10-CM

## 2020-11-08 DIAGNOSIS — F32A Depression, unspecified: Secondary | ICD-10-CM

## 2020-11-08 NOTE — Patient Instructions (Signed)
Your performance and presentation on neuropsychological assessment was overall consistent with fairly benign performance. That is to say that you performed within the expected limits for you in most areas. This is reassuring that there is no very severe cognitive problem. You did have some scattered low scores on measures of visuospatial functioning, which could reflect preexisting strengths and weaknesses, and you also had a few scattered low scores that suggest the possibility of executive control problems. Executive control problems are the most common problem I encounter in patients who do not have a major underlying organic cause for their cognitive difficulties such as dementia, strokes, head injuries, or other major neurological problems.   Executive control is a higher order cognitive ability involved in regulating other cognitive resources. Much like the conductor of an orchestra coordinates multiple instruments to make music, executive capacities coordinate other lower-order skills (e.g., movement, language, attention) to form complex human behaviors. Individuals with executive control problems are often capable of doing most of the things they did before they were having problems, but they may not do so as effortlessly, efficiently, and consistently. These difficulties often manifest as problems tracking information, multitasking, and paying attention. Executive control problems often result in cognitive inefficiency and can present as "memory problems," because they decrease encoding and spontaneous retrieval of information.   In your case, I think that your executive control problems are most likely related to reversible causes. Namely, insufficient sleep, distraction related to your multiple medical conditions, ongoing fatigue, chronic effects of stress, and also what appears to be an active depressive disorder. You reported feelings of unhappiness, pessimism, and feelings of sadness and scored  in the moderate range for depressive symptoms overall. Effective treatments for depression include psychotherapy and medication. You are already on medication for depression but you could consider seeing if titration and/or alternative treatment would be helpful. You could also consider adding psychotherapy.   Often times, individuals who are high functioning, have high expectations for themselves and others, and who are involved in intellectual activities are most sensitive to their cognitive changes. There are indications that others might describe you as a perfectionist, and thus this may be the case for you. I would encourage you to be kind to yourself, patient, and to do the best that you can, which is all any of [place] can do.   Avoid overfocusing on cognitive performance. Memory and cognition are notoriously fallible and if you are looking for cognitive problems, you are bound to find them. Once someone gets worried about their memory and thinking, they may overfocus on how they are doing day-to-day, and then when normal day-to-day cognitive errors are made, this becomes a cause for more concern. This concern and anxiety then decreases focus from the task at hand, reducing concentration, causing more cognitive problems, and creating a vicious cycle. Rather than critiquing your performance, I would encourage you to remain present minded and focus on the task at hand. Perhaps most importantly, have reasonable expectations for yourself.  Healthy people forget things, lose focus, and do not perform 100% correctly all the time. Some cognitive errors are normal and are not necessarily a sign that there is something wrong with your brain.   In terms of your vaccine reaction, I can not rule out that this has had some impact on your memory and thinking abilities. Often times, physical symptoms can be greatly undermining to cognition because things like pain, fatigue, headaches and the like are distracting and  draw ones attention inward from  the word around them to focus on the problems they are having. I am not aware of any credible studies suggesting long-lasting cognitive sequelae following COVID vaccine administration, and thus I would expect your problems to continue to improve over time.   There are few things as disruptive to brain functioning as not getting a good night's sleep. For sleep, I recommend against using medications, which can have lingering sedating effects on the brain and rob your brain of restful REM sleep. Instead, consider trying some of the following sleep hygiene recommendations. They may not work at once and may take effort, but the effort you spend is likely to be rewarded with better sleep eventually:   Stick to a sleep schedule of the same bedtime and wake time even on the weekends, which can help to regulate your body's internal clock so that you fall asleep and stay asleep.   Practice a relaxing bedtime ritual (conducted away from bright lights) which will help separate your sleep from stimulating activities and prepare your body to fall asleep when you go to bed.   Avoid naps, especially in the afternoon.   Evaluate your room and create conditions that will promote sleep such as keeping it cool (between 60 - 67 degrees), quiet, and free from any lights. Consider using blackout curtains, a "white noise" generator, or fan that will help mask any noises that might prevent you from going to sleep or awaken you during the night.   Sleep on a comfortable mattress and pillows.   Avoid bright light in the evening and excessive use of portable electronic devices right before bed that may contain light frequencies that can contribute to sleep problems.   Avoid alcohol, cigarettes, or heavy meals in the evening. If you must eat, consume a light snack 45 minutes before bed.   Use your bed only for sleep to strengthen the association between your bed and sleep.   If you can't go  to sleep within 30 minutes, go into another room and do something relaxing until you feel tired. Then, come back and try to go to sleep again for 30 minutes and repeat until sleep is achieved.   Some people find over the counter melatonin to be helpful for sleep, which you could discuss with a pharmacist or prescribing provider.

## 2020-11-08 NOTE — Progress Notes (Signed)
NEUROPSYCHOLOGY TELEMEDICINE NOTE [facility]  Telemedicine statement:  I discussed the limitations of neuropsychological care via telemedicine and the availability of in person appointments. The patient expressed understanding and agreed to proceed. The patient was verified with two identifiers.  The visit modality was: telephonic The patient location was: home The provider location was: office  The following individuals participated: Guilianna Bleicher  Feedback Note: I met with Suzanne Singh to review the findings resulting from her neuropsychological evaluation. Since the last appointment, she has been about the same. She had her Echo and is waiting for the read from Cardiology. Her memory problems have been perhaps a bit better, she has been sleeping better and is taking Melatonin and that is helpful. Time was spent reviewing the impressions and recommendations that are detailed in the evaluation report. We discussed impression of executive control problems, resulting in day-to-day forgetfulness. I was candid with Suzanne Singh that her PAI responses and presentation are consistent with a fairly significant depressive issue and that this may be a major contributor to her issues. Encouraged her to work actively with Dr. Carollee Herter on medications. She doesn't feel as though she has time for psychotherapy. Further recommendations as reflected in the patient instructions. I took time to explain the findings and answer all the patient's questions. I encouraged Suzanne Singh to contact me should she have any further questions or if further follow up is desired.   Current Medications and Medical History   Current Outpatient Medications  Medication Sig Dispense Refill  . albuterol (PROAIR HFA) 108 (90 Base) MCG/ACT inhaler Inhale 2 puffs into the lungs every 6 (six) hours as needed for wheezing or shortness of breath. 18 g 3  . clonazePAM (KLONOPIN) 0.5 MG tablet TAKE 1 TABLET(0.5 MG) BY MOUTH TWICE  DAILY AS NEEDED FOR ANXIETY 30 tablet 0  . cyclobenzaprine (FLEXERIL) 10 MG tablet Take 1 tablet (10 mg total) by mouth 3 (three) times daily as needed for muscle spasms. 30 tablet 0  . cyclobenzaprine (FLEXERIL) 5 MG tablet Take 1 tablet (5 mg total) by mouth 3 (three) times daily as needed for muscle spasms. 90 tablet 0  . diclofenac sodium (VOLTAREN) 1 % GEL Apply 4 g topically 4 (four) times daily. 100 g 2  . FLUoxetine (PROZAC) 40 MG capsule Take 1 capsule (40 mg total) by mouth daily. 90 capsule 3  . fluticasone (FLONASE) 50 MCG/ACT nasal spray Place 2 sprays into both nostrils daily. 16 g 6  . fluticasone (FLONASE) 50 MCG/ACT nasal spray Place 2 sprays into both nostrils daily. 16 g 6  . Ibuprofen-Famotidine 800-26.6 MG TABS Take one tablet po up to three times per day for joint pain. 90 tablet 3  . losartan (COZAAR) 50 MG tablet Take 1 tablet (50 mg total) by mouth daily. 90 tablet 0  . meloxicam (MOBIC) 7.5 MG tablet TAKE 1 TO 2 TABLETS BY MOUTH EVERY DAY AS NEEDED 60 tablet 2  . Olopatadine HCl (PATADAY) 0.2 % SOLN Place 1 drop into both eyes daily as needed. 2.5 mL 5  . predniSONE (DELTASONE) 10 MG tablet TAKE 3 TABLETS PO QD FOR 3 DAYS THEN TAKE 2 TABLETS PO QD FOR 3 DAYS THEN TAKE 1 TABLET PO QD FOR 3 DAYS THEN TAKE 1/2 TAB PO QD FOR 3 DAYS 20 tablet 0   Current Facility-Administered Medications  Medication Dose Route Frequency Provider Last Rate Last Admin  . lidocaine (PF) (XYLOCAINE) 1 % injection 2 mL  2 mL Other Once  Magnus Sinning, MD        Patient Active Problem List   Diagnosis Date Noted  . Palpitations 10/04/2020  . Tachycardia 06/14/2020  . Shortness of breath on exertion 06/14/2020  . Memory loss 06/14/2020  . Brain fog 06/14/2020  . Benign paroxysmal positional vertigo due to bilateral vestibular disorder 06/14/2020  . Tinnitus of both ears 06/14/2020  . Physical deconditioning 06/14/2020  . Essential hypertension 05/16/2020  . Stress headache 05/16/2020  .  SOB (shortness of breath) 05/16/2020  . Immunization reaction 05/16/2020  . Neck pain 03/31/2018  . Viral URI with cough 04/25/2017  . Abscess 12/27/2016  . Left anterior shoulder pain 06/28/2015  . Skin infection 01/12/2015  . Breast pain 01/12/2015  . UTI (urinary tract infection) 11/16/2014  . Weight gain 11/16/2014  . Bilateral knee pain 09/21/2014  . Thyromegaly 09/21/2014  . Obesity (BMI 30-39.9) 06/21/2014  . Edema 04/07/2014  . Maxillary sinusitis 11/07/2013  . Elevated BP 11/07/2013  . Insomnia 07/28/2013  . Epigastric pain 12/06/2012  . Hot flushes, perimenopausal 12/06/2012  . IBS (irritable bowel syndrome) 12/06/2012  . Fatigue 12/06/2012  . Family hx of colon cancer in grandparent 12/06/2012  . Screening for malignant neoplasm of the cervix 07/22/2012  . Routine general medical examination at a health care facility 07/22/2012  . Anxiety and depression 07/22/2012  . Eating disorder 07/22/2012  . GERD (gastroesophageal reflux disease) 07/22/2012  . Fibromyalgia 07/22/2012    Mental Status and Behavioral Observations  Suzanne Singh was available at the prespecified time for this telephonic appointment and was alert and generally oriented (orientation not formally assessed). Speech was normal in rate, rhythm, volume, and prosody. Self-reported mood was "allright" and affect as assessed by vocal quality was dysphoric. Thought process was logical and goal oriented and thought content was appropriate. There were no safety concerns identified at today's encounter, such as thoughts of harming self or others.   Plan  Feedback provided regarding the patient's neuropsychological evaluation. She has numerous potential contributors and reversible causes for her cognitive problems, the likelihood that they are solely due to a vaccine reaction or an underlying condition is very low. I think getting better control of her sleep, stress level, depression, migraines, and having more time  for self-care will be helpful. Suzanne Singh was encouraged to contact me if any questions arise or if further follow up is desired.   Viviano Simas Nicole Kindred, PsyD, ABN Clinical Neuropsychologist  Service(s) Provided at This Encounter: 23 minutes [phone]; Psychotherapy with patient/family)

## 2020-11-12 ENCOUNTER — Other Ambulatory Visit: Payer: Self-pay | Admitting: Family Medicine

## 2020-11-15 ENCOUNTER — Other Ambulatory Visit: Payer: Self-pay

## 2020-11-15 ENCOUNTER — Telehealth (INDEPENDENT_AMBULATORY_CARE_PROVIDER_SITE_OTHER): Payer: BC Managed Care – PPO | Admitting: Internal Medicine

## 2020-11-15 ENCOUNTER — Telehealth: Payer: Self-pay

## 2020-11-15 VITALS — BP 129/87 | HR 114 | Ht 70.0 in | Wt 213.0 lb

## 2020-11-15 DIAGNOSIS — R002 Palpitations: Secondary | ICD-10-CM | POA: Diagnosis not present

## 2020-11-15 DIAGNOSIS — I10 Essential (primary) hypertension: Secondary | ICD-10-CM | POA: Diagnosis not present

## 2020-11-15 NOTE — Progress Notes (Signed)
Electrophysiology TeleHealth Note   Due to national recommendations of social distancing due to COVID 19, an audio/video telehealth visit is felt to be most appropriate for this patient at this time.  See MyChart message from today for the patient's consent to telehealth for [facility].   Date:  11/15/2020   ID:  Suzanne Singh, DOB 10/02/1966, MRN [id number]  Location: patient's home  Provider location: [address], [place]  Evaluation Performed: Follow-up visit  PCP:  Donato Schultz, DO  Cardiologist:    Electrophysiologist:  SK   Chief Complaint:   Post vaccine lightheadedness   History of Present Illness:    Suzanne Singh is a 54 y.o. female who presents via audio/video conferencing for a telehealth visit today.  Since last being seen in our clinic for palpitations and braindfog, lightheadedness post Moderna vaccine #! the patient reports   BP remains elevated but better >> on losartan Sleeping better Fewer palpitations on less caffeine No more energy--still going to bed  Some exercise improving slowly  Arthritis is bothered by cold weather   Menses delayed for 3 months following vaccine -- no impact on her symptoms w recurrence   Concerned about vaccine 2 after the reaction to # 1   The patient denies symptoms of fevers, chills, cough, or new SOB worrisome for COVID 19.   Past Medical History:  Diagnosis Date  . Arrhythmia   . Arthritis   . Depression   . Eating disorder   . Fibromyalgia   . GERD (gastroesophageal reflux disease)   . IBS (irritable bowel syndrome)   . Migraine   . Seizures (HCC)     No past surgical history on file.  Current Outpatient Medications  Medication Sig Dispense Refill  . albuterol (PROAIR HFA) 108 (90 Base) MCG/ACT inhaler Inhale 2 puffs into the lungs every 6 (six) hours as needed for wheezing or shortness of breath. 18 g 3  . clonazePAM (KLONOPIN) 0.5 MG tablet TAKE 1 TABLET(0.5 MG) BY MOUTH TWICE  DAILY AS NEEDED FOR ANXIETY 30 tablet 0  . cyclobenzaprine (FLEXERIL) 10 MG tablet Take 1 tablet (10 mg total) by mouth 3 (three) times daily as needed for muscle spasms. 30 tablet 0  . diclofenac sodium (VOLTAREN) 1 % GEL Apply 4 g topically 4 (four) times daily. 100 g 2  . FLUoxetine (PROZAC) 40 MG capsule Take 1 capsule (40 mg total) by mouth daily. 90 capsule 3  . fluticasone (FLONASE) 50 MCG/ACT nasal spray Place 2 sprays into both nostrils daily. 16 g 6  . Ibuprofen-Famotidine 800-26.6 MG TABS Take one tablet po up to three times per day for joint pain. 90 tablet 3  . losartan (COZAAR) 50 MG tablet Take 1 tablet (50 mg total) by mouth daily. 90 tablet 0  . meloxicam (MOBIC) 7.5 MG tablet Take 1-2 tablets (7.5-15 mg total) by mouth daily as needed for pain. 60 tablet 2  . predniSONE (DELTASONE) 10 MG tablet TAKE 3 TABLETS PO QD FOR 3 DAYS THEN TAKE 2 TABLETS PO QD FOR 3 DAYS THEN TAKE 1 TABLET PO QD FOR 3 DAYS THEN TAKE 1/2 TAB PO QD FOR 3 DAYS 20 tablet 0  . cyclobenzaprine (FLEXERIL) 5 MG tablet Take 1 tablet (5 mg total) by mouth 3 (three) times daily as needed for muscle spasms. (Patient not taking: Reported on 11/15/2020) 90 tablet 0  . fluticasone (FLONASE) 50 MCG/ACT nasal spray Place 2 sprays into both nostrils daily. (Patient  not taking: Reported on 11/15/2020) 16 g 6  . Olopatadine HCl (PATADAY) 0.2 % SOLN Place 1 drop into both eyes daily as needed. (Patient not taking: Reported on 11/15/2020) 2.5 mL 5   Current Facility-Administered Medications  Medication Dose Route Frequency Provider Last Rate Last Admin  . lidocaine (PF) (XYLOCAINE) 1 % injection 2 mL  2 mL Other Once Tyrell Antonio, MD        Allergies:   Ambien cr [zolpidem tartrate er], Cymbalta [duloxetine hcl], Celebrex [celecoxib], Hydrocodone, and Latex   Social History:  The patient  reports that she has never smoked. She has never used smokeless tobacco. She reports that she does not drink alcohol and does not use  drugs.   Family History:  The patient's   family history includes Alcohol abuse in her father and mother; Arthritis in her maternal grandmother and mother; Breast cancer in her maternal grandmother; Colon cancer in her father and paternal grandfather; Diabetes in her father and paternal grandfather; Early death in her sister; Heart disease in her father, paternal grandfather, and paternal grandmother; Hypertension in her father, maternal grandfather, maternal grandmother, mother, paternal grandfather, and paternal grandmother; Kidney disease in her paternal grandfather; Lung cancer in her mother; Mental illness in her mother; Stroke in her maternal grandfather.   ROS:  Please see the history of present illness.   All other systems are personally reviewed and negative.    Exam:    Vital Signs:  BP 129/87   Pulse (!) 114   Ht 5\' 10"  (1.778 m)   Wt 213 lb (96.6 kg)   BMI 30.56 kg/m     Labs/Other Tests and Data Reviewed:    Recent Labs: 05/15/2020: TSH 1.75 06/18/2020: ALT 15; BUN 18; Creatinine, Ser 0.77; Hemoglobin 11.9; Platelets 338; Potassium 4.4; Sodium 138   Wt Readings from Last 3 Encounters:  11/15/20 213 lb (96.6 kg)  09/04/20 216 lb (98 kg)  08/08/20 216 lb 3.2 oz (98.1 kg)     Other studies personally reviewed: Additional studies/ records that were reviewed today include:     ASSESSMENT & PLAN:    Hypertension  Palpitations  Brain fog  ?  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  Should she be considered for a sleep study?  Continue exercise; encouraged rest  Discussed taking losartan daily at 50 mg -- she will resume and then see what happens to fatigue etc  Have little intelligent advice about vaccijnes, except to encourage her to consider the 2 dose with her allergist and to think about taking Pfizer as I would presume that the reactions are  to carriers and not the mRNA   She should check w her allaergist      Blood pressure better on losartan  Increase 50/25 alt t 50 daily Continue efforts to exercise    COVID 19 screen The patient denies symptoms of COVID 19 at this time.  The importance of social distancing was discussed today.  Follow-up:  31m     Current medicines are reviewed at length with the patient today.   The patient does not have concerns regarding her medicines.  The following changes were made today:  Increase losartan 50 bid from 50alt 25   Labs/ tests ordered today include:  No orders of the defined types were placed in this encounter.   Future tests ( post COVID )     Patient  Risk:  after full review of this patients clinical status, I feel that they are at moderate  risk at this time.  Today, I have spent 28  minutes with the patient with telehealth technology discussing the above.  Signed, Sherryl Manges, MD  11/15/2020 5:40 PM     [facility] [address] [place] [address] [phone] (office) [phone] (fax)

## 2020-11-15 NOTE — Telephone Encounter (Signed)
  Patient Consent for Virtual Visit         Suzanne Singh has provided verbal consent on 11/15/2020 for a virtual visit (video or telephone).   CONSENT FOR VIRTUAL VISIT FOR:  Suzanne Singh  By participating in this virtual visit I agree to the following:  I hereby voluntarily request, consent and authorize [facility] and its employed or contracted physicians, Producer, television/film/video, nurse practitioners or other licensed health care professionals (the Practitioner), to provide me with telemedicine health care services (the "Services") as deemed necessary by the treating Practitioner. I acknowledge and consent to receive the Services by the Practitioner via telemedicine. I understand that the telemedicine visit will involve communicating with the Practitioner through live audiovisual communication technology and the disclosure of certain medical information by electronic transmission. I acknowledge that I have been given the opportunity to request an in-person assessment or other available alternative prior to the telemedicine visit and am voluntarily participating in the telemedicine visit.  I understand that I have the right to withhold or withdraw my consent to the use of telemedicine in the course of my care at any time, without affecting my right to future care or treatment, and that the Practitioner or I may terminate the telemedicine visit at any time. I understand that I have the right to inspect all information obtained and/or recorded in the course of the telemedicine visit and may receive copies of available information for a reasonable fee.  I understand that some of the potential risks of receiving the Services via telemedicine include:  Marland Kitchen Delay or interruption in medical evaluation due to technological equipment failure or disruption; . Information transmitted may not be sufficient (e.g. poor resolution of images) to allow for appropriate medical decision making by the Practitioner;  and/or  . In rare instances, security protocols could fail, causing a breach of personal health information.  Furthermore, I acknowledge that it is my responsibility to provide information about my medical history, conditions and care that is complete and accurate to the best of my ability. I acknowledge that Practitioner's advice, recommendations, and/or decision may be based on factors not within their control, such as incomplete or inaccurate data provided by me or distortions of diagnostic images or specimens that may result from electronic transmissions. I understand that the practice of medicine is not an exact science and that Practitioner makes no warranties or guarantees regarding treatment outcomes. I acknowledge that a copy of this consent can be made available to me via my patient portal [facility] MyChart), or I can request a printed copy by calling the office of [facility].    I understand that my insurance will be billed for this visit.   I have read or had this consent read to me. . I understand the contents of this consent, which adequately explains the benefits and risks of the Services being provided via telemedicine.  . I have been provided ample opportunity to ask questions regarding this consent and the Services and have had my questions answered to my satisfaction. . I give my informed consent for the services to be provided through the use of telemedicine in my medical care

## 2020-11-21 ENCOUNTER — Telehealth (INDEPENDENT_AMBULATORY_CARE_PROVIDER_SITE_OTHER): Payer: BC Managed Care – PPO | Admitting: Family Medicine

## 2020-11-21 ENCOUNTER — Other Ambulatory Visit: Payer: Self-pay

## 2020-11-21 ENCOUNTER — Encounter: Payer: Self-pay | Admitting: Family Medicine

## 2020-11-21 VITALS — Ht 70.0 in | Wt 213.0 lb

## 2020-11-21 DIAGNOSIS — F4541 Pain disorder exclusively related to psychological factors: Secondary | ICD-10-CM

## 2020-11-21 DIAGNOSIS — S139XXA Sprain of joints and ligaments of unspecified parts of neck, initial encounter: Secondary | ICD-10-CM

## 2020-11-21 DIAGNOSIS — H9313 Tinnitus, bilateral: Secondary | ICD-10-CM

## 2020-11-21 MED ORDER — PREDNISONE 10 MG PO TABS: ORAL_TABLET | ORAL | 0 refills | Status: DC

## 2020-11-21 MED ORDER — CYCLOBENZAPRINE HCL 10 MG PO TABS: 10.0000 mg | ORAL_TABLET | Freq: Three times a day (TID) | ORAL | 0 refills | Status: DC | PRN

## 2020-11-21 MED ORDER — TRAMADOL HCL 50 MG PO TABS: 50.0000 mg | ORAL_TABLET | Freq: Three times a day (TID) | ORAL | 0 refills | Status: AC | PRN

## 2020-11-21 NOTE — Progress Notes (Signed)
Virtual Visit via Video Note  I connected with [name] on 11/21/20 at  8:20 AM EST by a video enabled telemedicine application and verified that I am speaking with the correct person using two identifiers.  Location/ persons in visit Patient: home alone  Provider: home   I discussed the limitations of evaluation and management by telemedicine and the availability of in person appointments. The patient expressed understanding and agreed to proceed.  History of Present Illness: Pt is home c/o stiff neck and shoulder on left -- she woke up that way today but is actually started a few days ago --- unable to to video due to pt having I phone----  Visit finished with telephone visit    Observations/Objective: There were no vitals filed for this visit. Pt with turning head to left and muscle spasms   Assessment and Plan: 1.  Cervical strain/ sprain  Flexeril and pred sent in Tramadol for pain  con't warm compressss If no relief----  Pt will come in for ov  Follow Up Instructions:    I discussed the assessment and treatment plan with the patient. The patient was provided an opportunity to ask questions and all were answered. The patient agreed with the plan and demonstrated an understanding of the instructions.   The patient was advised to call back or seek an in-person evaluation if the symptoms worsen or if the condition fails to improve as anticipated.  I provided 25 minutes of non-face-to-face time during this encounter.   Donato Schultz, DO

## 2020-12-25 ENCOUNTER — Encounter: Payer: Self-pay | Admitting: Physical Therapy

## 2020-12-25 NOTE — Therapy (Signed)
[facility] [address] [place], [place], [address] Phone: [phone]   Fax:  [phone]  Patient Details  Name: Suzanne Singh MRN: [id number] Date of Birth: May 26, 1966 Referring Provider:  Fenton Foy, NP   Encounter Date: 12/25/2020   PHYSICAL THERAPY DISCHARGE SUMMARY  Visits from Start of Care: 1  Current functional level related to goals / functional outcomes: Unable to formally assess; pt did not return to therapy after initial evaluation.   Remaining deficits: Dizziness   Education / Equipment: N/A  Plan: Patient agrees to discharge.  Patient goals were not met. Patient is being discharged due to not returning since the last visit.  ?????     Rico Junker, PT, DPT 12/25/20    11:28 AM    [facility] [address] [place], [place], [address] Phone: [phone]   Fax:  [phone]

## 2021-01-01 ENCOUNTER — Telehealth (INDEPENDENT_AMBULATORY_CARE_PROVIDER_SITE_OTHER): Payer: BC Managed Care – PPO | Admitting: Family Medicine

## 2021-01-01 ENCOUNTER — Other Ambulatory Visit: Payer: Self-pay

## 2021-01-01 DIAGNOSIS — L659 Nonscarring hair loss, unspecified: Secondary | ICD-10-CM

## 2021-01-01 DIAGNOSIS — G43809 Other migraine, not intractable, without status migrainosus: Secondary | ICD-10-CM | POA: Diagnosis not present

## 2021-01-01 DIAGNOSIS — H9313 Tinnitus, bilateral: Secondary | ICD-10-CM

## 2021-01-01 MED ORDER — CARISOPRODOL 350 MG PO TABS: 350.0000 mg | ORAL_TABLET | Freq: Four times a day (QID) | ORAL | 0 refills | Status: DC | PRN

## 2021-01-01 MED ORDER — RIZATRIPTAN BENZOATE 10 MG PO TBDP: 10.0000 mg | ORAL_TABLET | ORAL | 0 refills | Status: DC | PRN

## 2021-01-02 ENCOUNTER — Encounter: Payer: Self-pay | Admitting: Family Medicine

## 2021-01-02 NOTE — Progress Notes (Signed)
Virtual Visit via Video Note  I connected with [name] on 01/02/21 at  4:00 PM EST by a video enabled telemedicine application and verified that I am speaking with the correct persn using two identifiers.  Location:/ participants in visit  Patient: home alone  Provider: office   I discussed the limitations of evaluation and management by telemedicine and the availability of in person appointments. The patient expressed understanding and agreed to proceed.  History of Present Illness: Pt is home and still having side effects from covid vaccine #1   == she may have had covid at the same time  Pt still having ringing in ears and headaches    She also c/o hair falling out   She has been to covid clinic    Observations/Objective: There were no vitals filed for this visit. Pt in nad   Assessment and Plan: 1. Tinnitus of both ears - Ambulatory referral to Neurology  2. Other migraine without status migrainosus, not intractable Refer to chiropractor / neuro  Will change muscle relaxer and rx maxalt  She has ha 2-3 x a week  - Ambulatory referral to Neurology - Ambulatory referral to Chiropractic - rizatriptan (MAXALT-MLT) 10 MG disintegrating tablet; Take 1 tablet (10 mg total) by mouth as needed for migraine. May repeat in 2 hours if needed  Dispense: 10 tablet; Refill: 0 - carisoprodol (SOMA) 350 MG tablet; Take 1 tablet (350 mg total) by mouth 4 (four) times daily as needed for muscle spasms.  Dispense: 30 tablet; Refill: 0  3. Hair loss Refer to derm  - Ambulatory referral to Dermatology   Follow Up Instructions:    I discussed the assessment and treatment plan with the patient. The patient was provided an opportunity to ask questions and all were answered. The patient agreed with the plan and demonstrated an understanding of the instructions.   The patient was advised to call back or seek an in-person evaluation if the symptoms worsen or if the condition fails to improve as  anticipated.  I provided 25 minutes of non-face-to-face time during this encounter.   Donato Schultz, DO

## 2021-03-18 ENCOUNTER — Other Ambulatory Visit: Payer: Self-pay | Admitting: Family Medicine

## 2021-03-18 ENCOUNTER — Other Ambulatory Visit: Payer: Self-pay

## 2021-03-18 DIAGNOSIS — I10 Essential (primary) hypertension: Secondary | ICD-10-CM

## 2021-03-18 DIAGNOSIS — F419 Anxiety disorder, unspecified: Secondary | ICD-10-CM

## 2021-03-18 MED ORDER — LOSARTAN POTASSIUM 50 MG PO TABS: 50.0000 mg | ORAL_TABLET | Freq: Every day | ORAL | 0 refills | Status: DC

## 2021-03-21 ENCOUNTER — Ambulatory Visit: Payer: BC Managed Care – PPO | Admitting: Family Medicine

## 2021-03-21 ENCOUNTER — Encounter: Payer: Self-pay | Admitting: Family Medicine

## 2021-03-21 ENCOUNTER — Other Ambulatory Visit: Payer: Self-pay

## 2021-03-21 VITALS — BP 126/87 | HR 64 | Temp 97.3°F | Ht 70.0 in | Wt 213.0 lb

## 2021-03-21 DIAGNOSIS — R5383 Other fatigue: Secondary | ICD-10-CM

## 2021-03-21 DIAGNOSIS — F32A Depression, unspecified: Secondary | ICD-10-CM

## 2021-03-21 DIAGNOSIS — M542 Cervicalgia: Secondary | ICD-10-CM

## 2021-03-21 DIAGNOSIS — M5030 Other cervical disc degeneration, unspecified cervical region: Secondary | ICD-10-CM | POA: Diagnosis not present

## 2021-03-21 DIAGNOSIS — F418 Other specified anxiety disorders: Secondary | ICD-10-CM

## 2021-03-21 DIAGNOSIS — F419 Anxiety disorder, unspecified: Secondary | ICD-10-CM

## 2021-03-21 MED ORDER — FLUOXETINE HCL 20 MG PO TABS: 20.0000 mg | ORAL_TABLET | Freq: Every day | ORAL | 3 refills | Status: DC

## 2021-03-21 MED ORDER — KETOROLAC TROMETHAMINE 60 MG/2ML IM SOLN
60.0000 mg | Freq: Once | INTRAMUSCULAR | Status: AC
  Administered 2021-03-21: 60 mg via INTRAMUSCULAR

## 2021-03-21 MED ORDER — TRAMADOL HCL 50 MG PO TABS: 50.0000 mg | ORAL_TABLET | Freq: Three times a day (TID) | ORAL | 0 refills | Status: AC | PRN

## 2021-03-21 MED ORDER — DIAZEPAM 5 MG PO TABS: 5.0000 mg | ORAL_TABLET | Freq: Two times a day (BID) | ORAL | 1 refills | Status: DC | PRN

## 2021-03-21 NOTE — Patient Instructions (Signed)

## 2021-03-21 NOTE — Assessment & Plan Note (Signed)
con't chiropractor / massage Refer pt for ? Dry needling  Mri neck Tramadol and valium

## 2021-03-21 NOTE — Addendum Note (Signed)
Addended by: Bonney Leitz on: 03/21/2021 05:04 PM   Modules accepted: Orders

## 2021-03-21 NOTE — Assessment & Plan Note (Signed)
tramadol and valium

## 2021-03-21 NOTE — Progress Notes (Signed)
Subjective:   By signing my name below, I, [name], attest that this documentation has been prepared under the direction and in the presence of  Dr. Donato SchultzLowne-Chase, Mehlani Blankenburg R. 03/21/2021  Patient ID: Suzanne Singh, female    DOB: August 09, 1966, 55 y.o.   MRN: [id number]  Chief Complaint  Patient presents with  . Medication Problem    Losartan, feels tired all the tim/ and fluoxetine does not see any difference wants to lower Rx     HPI Patient is in today for office visit. She is still having neck pain that radiate down to her right shoulder. She has now developed tingling, numbness, and pain in her right shoulder. She also complains of having muscle spasms that is disrupting her sleeping at night. She was dx with DDD last year.    She has some fatigue through out the day that she relates to the increase dosage of her Prozac medication at 40mg  from 20mg . She is requesting to lower the dosage. She reports she is getting less sleep at night due to feeling anxious.  Past Medical History:  Diagnosis Date  . Arrhythmia   . Arthritis   . Depression   . Eating disorder   . Fibromyalgia   . GERD (gastroesophageal reflux disease)   . IBS (irritable bowel syndrome)   . Migraine   . Seizures (HCC)     No past surgical history on file.  Family History  Problem Relation Age of Onset  . Early death Sister   . Alcohol abuse Mother   . Arthritis Mother   . Lung cancer Mother   . Hypertension Mother   . Mental illness Mother   . Alcohol abuse Father   . Heart disease Father   . Hypertension Father   . Diabetes Father   . Colon cancer Father   . Arthritis Maternal Grandmother   . Hypertension Maternal Grandmother   . Breast cancer Maternal Grandmother   . Stroke Maternal Grandfather   . Hypertension Maternal Grandfather   . Heart disease Paternal Grandmother   . Hypertension Paternal Grandmother   . Colon cancer Paternal Grandfather   . Heart disease Paternal Grandfather   .  Hypertension Paternal Grandfather   . Kidney disease Paternal Grandfather   . Diabetes Paternal Grandfather     Social History   Socioeconomic History  . Marital status: Married    Spouse name: Not on file  . Number of children: 2  . Years of education: Not on file  . Highest education level: Not on file  Occupational History  . Not on file  Tobacco Use  . Smoking status: Never Smoker  . Smokeless tobacco: Never Used  Substance and Sexual Activity  . Alcohol use: No    Alcohol/week: 0.0 standard drinks    Comment: wine on occasion  . Drug use: No  . Sexual activity: Not on file  Other Topics Concern  . 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   2 sons      Exercise-no   Social Determinants of Corporate investment bankerHealth   Financial Resource Strain: Not on file  Food Insecurity: Not on file  Transportation Needs: Not on file  Physical Activity: Not on file  Stress: Not on file  Social Connections: Not on file  Intimate Partner Violence: Not on file    Outpatient Medications Prior to Visit  Medication Sig  Dispense Refill  . albuterol (PROAIR HFA) 108 (90 Base) MCG/ACT inhaler Inhale 2 puffs into the lungs every 6 (six) hours as needed for wheezing or shortness of breath. 18 g 3  . diclofenac sodium (VOLTAREN) 1 % GEL Apply 4 g topically 4 (four) times daily. 100 g 2  . fluticasone (FLONASE) 50 MCG/ACT nasal spray Place 2 sprays into both nostrils daily. 16 g 6  . Ibuprofen-Famotidine 800-26.6 MG TABS Take one tablet po up to three times per day for joint pain. 90 tablet 3  . losartan (COZAAR) 50 MG tablet Take 1 tablet (50 mg total) by mouth daily. 90 tablet 0  . Olopatadine HCl (PATADAY) 0.2 % SOLN Place 1 drop into both eyes daily as needed. 2.5 mL 5  . rizatriptan (MAXALT-MLT) 10 MG disintegrating tablet Take 1 tablet (10 mg total) by mouth as needed for migraine.  May repeat in 2 hours if needed 10 tablet 0  . clonazePAM (KLONOPIN) 0.5 MG tablet TAKE 1 TABLET(0.5 MG) BY MOUTH TWICE DAILY AS NEEDED FOR ANXIETY 30 tablet 0  . FLUoxetine (PROZAC) 40 MG capsule Take 1 capsule (40 mg total) by mouth daily. 90 capsule 1  . carisoprodol (SOMA) 350 MG tablet Take 1 tablet (350 mg total) by mouth 4 (four) times daily as needed for muscle spasms. (Patient not taking: Reported on 03/21/2021) 30 tablet 0  . fluticasone (FLONASE) 50 MCG/ACT nasal spray Place 2 sprays into both nostrils daily. (Patient not taking: Reported on 03/21/2021) 16 g 6   Facility-Administered Medications Prior to Visit  Medication Dose Route Frequency Provider Last Rate Last Admin  . lidocaine (PF) (XYLOCAINE) 1 % injection 2 mL  2 mL Other Once Tyrell Antonio, MD        Allergies  Allergen Reactions  . Ambien Cr [Zolpidem Tartrate Er]     Seizure   . Cymbalta [Duloxetine Hcl]     Seizure   . Celebrex [Celecoxib]     Hives   . Hydrocodone Itching  . Latex Rash    Adhesive    Review of Systems  Constitutional: Positive for malaise/fatigue. Negative for chills and fever.  HENT: Negative for congestion, ear pain, sinus pain and sore throat.   Eyes: Negative for pain.  Respiratory: Negative for cough, shortness of breath and wheezing.   Cardiovascular: Negative for chest pain, palpitations and leg swelling.  Gastrointestinal: Negative for abdominal pain, blood in stool, diarrhea, nausea and vomiting.  Genitourinary: Negative for dysuria, frequency, hematuria and urgency.  Musculoskeletal: Positive for neck pain.  Neurological: Positive for tingling (right arm). Negative for dizziness and headaches.       (+)Numbness in right arm  Psychiatric/Behavioral: The patient is nervous/anxious.        (+)Sleep disturbance       Objective:    Physical Exam Constitutional:      General: She is not in acute distress.    Appearance: Normal appearance. She is not ill-appearing.  HENT:      Head: Normocephalic and atraumatic.     Right Ear: External ear normal.     Left Ear: External ear normal.  Eyes:     Extraocular Movements: Extraocular movements intact.     Pupils: Pupils are equal, round, and reactive to light.  Cardiovascular:     Rate and Rhythm: Normal rate and regular rhythm.  Pulmonary:     Effort: Pulmonary effort is normal. No respiratory distress.     Breath sounds: Normal breath sounds. No wheezing, rhonchi  or rales.  Musculoskeletal:     Comments: There is a palpable knot on the right trapezius  Neurological:     Mental Status: She is alert and oriented to person, place, and time.  Psychiatric:        Behavior: Behavior normal.     BP 126/87 (BP Location: Right Arm, Patient Position: Sitting, Cuff Size: Large)   Pulse 64   Temp (!) 97.3 F (36.3 C) (Temporal)   Ht 5\' 10"  (1.778 m)   Wt 213 lb (96.6 kg)   SpO2 98%   BMI 30.56 kg/m  Wt Readings from Last 3 Encounters:  03/21/21 213 lb (96.6 kg)  11/21/20 213 lb (96.6 kg)  11/15/20 213 lb (96.6 kg)    Diabetic Foot Exam - Simple   No data filed    Lab Results  Component Value Date   WBC 5.4 06/18/2020   HGB 11.9 06/18/2020   HCT 37.1 06/18/2020   PLT 338 06/18/2020   GLUCOSE 92 06/18/2020   CHOL 191 02/10/2019   TRIG 51.0 02/10/2019   HDL 63.50 02/10/2019   LDLCALC 118 (H) 02/10/2019   ALT 15 06/18/2020   AST 24 06/18/2020   NA 138 06/18/2020   K 4.4 06/18/2020   CL 102 06/18/2020   CREATININE 0.77 06/18/2020   BUN 18 06/18/2020   CO2 24 06/18/2020   TSH 1.75 05/15/2020    Lab Results  Component Value Date   TSH 1.75 05/15/2020   Lab Results  Component Value Date   WBC 5.4 06/18/2020   HGB 11.9 06/18/2020   HCT 37.1 06/18/2020   MCV 90 06/18/2020   PLT 338 06/18/2020   Lab Results  Component Value Date   NA 138 06/18/2020   K 4.4 06/18/2020   CO2 24 06/18/2020   GLUCOSE 92 06/18/2020   BUN 18 06/18/2020   CREATININE 0.77 06/18/2020   BILITOT 0.2  06/18/2020   ALKPHOS 126 (H) 06/18/2020   AST 24 06/18/2020   ALT 15 06/18/2020   PROT 7.1 06/18/2020   ALBUMIN 4.4 06/18/2020   CALCIUM 10.0 06/18/2020   GFR 99.58 05/15/2020   Lab Results  Component Value Date   CHOL 191 02/10/2019   Lab Results  Component Value Date   HDL 63.50 02/10/2019   Lab Results  Component Value Date   LDLCALC 118 (H) 02/10/2019   Lab Results  Component Value Date   TRIG 51.0 02/10/2019   Lab Results  Component Value Date   CHOLHDL 3 02/10/2019   No results found for: HGBA1C     Assessment & Plan:   Problem List Items Addressed This Visit      Unprioritized   Anxiety and depression    .lower prozac to 20 mg  Valium to help with sleep / anxiety        Relevant Medications   FLUoxetine (PROZAC) 20 MG tablet   diazepam (VALIUM) 5 MG tablet   DDD (degenerative disc disease), cervical    tramadol and valium       Relevant Medications   traMADol (ULTRAM) 50 MG tablet   Other Relevant Orders   Ambulatory referral to Physical Therapy   Fatigue   Relevant Orders   CBC with Differential/Platelet   TSH   Vitamin B12   Comprehensive metabolic panel   Vitamin D (25 hydroxy)   Epstein-Barr virus VCA, IgG   Epstein-Barr virus VCA, IgM   RSV(respiratory syncytial virus) ab   CMV IgM   Neck pain on  right side    con't chiropractor / massage Refer pt for ? Dry needling  Mri neck Tramadol and valium       Relevant Medications   diazepam (VALIUM) 5 MG tablet   traMADol (ULTRAM) 50 MG tablet   Other Relevant Orders   MR Cervical Spine Wo Contrast    Other Visit Diagnoses    Depression with anxiety    -  Primary   Relevant Medications   FLUoxetine (PROZAC) 20 MG tablet   diazepam (VALIUM) 5 MG tablet   Other Relevant Orders   CBC with Differential/Platelet   TSH   Vitamin B12   Comprehensive metabolic panel   Vitamin D (25 hydroxy)       Meds ordered this encounter  Medications  . FLUoxetine (PROZAC) 20 MG tablet     Sig: Take 1 tablet (20 mg total) by mouth daily.    Dispense:  90 tablet    Refill:  3  . diazepam (VALIUM) 5 MG tablet    Sig: Take 1 tablet (5 mg total) by mouth every 12 (twelve) hours as needed for anxiety.    Dispense:  30 tablet    Refill:  1  . traMADol (ULTRAM) 50 MG tablet    Sig: Take 1 tablet (50 mg total) by mouth every 8 (eight) hours as needed for up to 5 days.    Dispense:  15 tablet    Refill:  0  . ketorolac (TORADOL) injection 60 mg    I, Donato Schultz, DO, personally preformed the services described in this documentation.  All medical record entries made by the scribe were at my direction and in my presence.  I have reviewed the chart and discharge instructions (if applicable) and agree that the record reflects my personal performance and is accurate and complete. 03/21/2021  I,[name],acting as a scribe for Donato Schultz, DO.,have documented all relevant documentation on the behalf of Donato Schultz, DO,as directed by  Donato Schultz, DO while in the presence of Donato Schultz, DO.  I, Donato Schultz, DO, have reviewed all documentation for this visit. The documentation on 03/21/21 for the exam, diagnosis, procedures, and orders are all accurate and complete. Donato Schultz, DO   [address] [place], [place]

## 2021-03-21 NOTE — Assessment & Plan Note (Addendum)
.  lower prozac to 20 mg  Valium to help with sleep / anxiety

## 2021-03-25 ENCOUNTER — Other Ambulatory Visit (INDEPENDENT_AMBULATORY_CARE_PROVIDER_SITE_OTHER): Payer: BC Managed Care – PPO

## 2021-03-25 ENCOUNTER — Encounter: Payer: Self-pay | Admitting: Family Medicine

## 2021-03-25 ENCOUNTER — Other Ambulatory Visit: Payer: Self-pay

## 2021-03-25 DIAGNOSIS — F418 Other specified anxiety disorders: Secondary | ICD-10-CM | POA: Diagnosis not present

## 2021-03-25 DIAGNOSIS — R5383 Other fatigue: Secondary | ICD-10-CM | POA: Diagnosis not present

## 2021-03-25 LAB — CBC WITH DIFFERENTIAL/PLATELET
Basophils Absolute: 0 10*3/uL (ref 0.0–0.1)
Basophils Relative: 0.6 % (ref 0.0–3.0)
Eosinophils Absolute: 0 10*3/uL (ref 0.0–0.7)
Eosinophils Relative: 1.6 % (ref 0.0–5.0)
HCT: 32.9 % — ABNORMAL LOW (ref 36.0–46.0)
Hemoglobin: 10.9 g/dL — ABNORMAL LOW (ref 12.0–15.0)
Lymphocytes Relative: 38.1 % (ref 12.0–46.0)
Lymphs Abs: 1.2 10*3/uL (ref 0.7–4.0)
MCHC: 33.2 g/dL (ref 30.0–36.0)
MCV: 88.9 fl (ref 78.0–100.0)
Monocytes Absolute: 0.4 10*3/uL (ref 0.1–1.0)
Monocytes Relative: 14 % — ABNORMAL HIGH (ref 3.0–12.0)
Neutro Abs: 1.4 10*3/uL (ref 1.4–7.7)
Neutrophils Relative %: 45.7 % (ref 43.0–77.0)
Platelets: 268 10*3/uL (ref 150.0–400.0)
RBC: 3.7 Mil/uL — ABNORMAL LOW (ref 3.87–5.11)
RDW: 13.6 % (ref 11.5–15.5)
WBC: 3.1 10*3/uL — ABNORMAL LOW (ref 4.0–10.5)

## 2021-03-25 LAB — COMPREHENSIVE METABOLIC PANEL
ALT: 11 U/L (ref 0–35)
AST: 17 U/L (ref 0–37)
Albumin: 3.5 g/dL (ref 3.5–5.2)
Alkaline Phosphatase: 79 U/L (ref 39–117)
BUN: 17 mg/dL (ref 6–23)
CO2: 28 mEq/L (ref 19–32)
Calcium: 8.7 mg/dL (ref 8.4–10.5)
Chloride: 104 mEq/L (ref 96–112)
Creatinine, Ser: 0.69 mg/dL (ref 0.40–1.20)
GFR: 99.22 mL/min (ref >60.00)
Glucose, Bld: 82 mg/dL (ref 70–99)
Potassium: 4 mEq/L (ref 3.5–5.1)
Sodium: 139 mEq/L (ref 135–145)
Total Bilirubin: 0.4 mg/dL (ref 0.2–1.2)
Total Protein: 6.2 g/dL (ref 6.0–8.3)

## 2021-03-25 LAB — VITAMIN D 25 HYDROXY (VIT D DEFICIENCY, FRACTURES): VITD: 51.81 ng/mL (ref 30.00–100.00)

## 2021-03-25 LAB — VITAMIN B12: Vitamin B-12: 496 pg/mL (ref 211–911)

## 2021-03-25 LAB — TSH: TSH: 1.96 u[IU]/mL (ref 0.35–4.50)

## 2021-03-26 ENCOUNTER — Other Ambulatory Visit: Payer: Self-pay

## 2021-03-26 ENCOUNTER — Other Ambulatory Visit: Payer: Self-pay | Admitting: Family Medicine

## 2021-03-26 DIAGNOSIS — D649 Anemia, unspecified: Secondary | ICD-10-CM

## 2021-03-26 DIAGNOSIS — R5383 Other fatigue: Secondary | ICD-10-CM

## 2021-03-26 NOTE — Telephone Encounter (Signed)
Called pt and advise, she had questions about certain things noted on result note and forwarded it to Dr. Laury Axon. Will advise when she replies. -Jma

## 2021-03-27 ENCOUNTER — Other Ambulatory Visit: Payer: Self-pay

## 2021-03-28 ENCOUNTER — Telehealth: Payer: Self-pay

## 2021-03-28 NOTE — Telephone Encounter (Signed)
Caller states she is calling for her lab results.  Telephone; [phone]

## 2021-03-29 LAB — EPSTEIN-BARR VIRUS VCA, IGG: EBV VCA IgG: 466 U/mL — ABNORMAL HIGH

## 2021-03-29 LAB — CMV IGM: CMV IgM: <30 AU/mL

## 2021-03-29 LAB — TEST AUTHORIZATION

## 2021-03-29 LAB — EPSTEIN-BARR VIRUS VCA, IGM: EBV VCA IgM: <36 U/mL

## 2021-03-29 LAB — EPSTEIN-BARR VIRUS NUCLEAR ANTIGEN ANTIBODY, IGG: EBV NA IgG: 94.3 U/mL — ABNORMAL HIGH

## 2021-03-29 LAB — RSV(RESPIRATORY SYNCYTIAL VIRUS) AB, BLOOD: RSV Antibodies: <1:8 {titer}

## 2021-04-02 ENCOUNTER — Encounter: Payer: Self-pay | Admitting: Family Medicine

## 2021-04-07 HISTORY — PX: OTHER SURGICAL HISTORY: SHX169

## 2021-08-08 ENCOUNTER — Encounter: Payer: BC Managed Care – PPO | Admitting: Family Medicine

## 2021-08-29 ENCOUNTER — Other Ambulatory Visit: Payer: Self-pay

## 2021-08-29 ENCOUNTER — Encounter: Payer: Self-pay | Admitting: Family Medicine

## 2021-08-29 ENCOUNTER — Telehealth (INDEPENDENT_AMBULATORY_CARE_PROVIDER_SITE_OTHER): Payer: BC Managed Care – PPO | Admitting: Family Medicine

## 2021-08-29 VITALS — BP 117/83 | HR 80

## 2021-08-29 DIAGNOSIS — J0140 Acute pansinusitis, unspecified: Secondary | ICD-10-CM | POA: Diagnosis not present

## 2021-08-29 MED ORDER — AMOXICILLIN-POT CLAVULANATE 875-125 MG PO TABS: 1.0000 | ORAL_TABLET | Freq: Two times a day (BID) | ORAL | 0 refills | Status: DC

## 2021-08-29 NOTE — Progress Notes (Addendum)
MyChart Video Visit    Virtual Visit via Video Note   This visit type was conducted due to national recommendations for restrictions regarding the COVID-19 Pandemic (e.g. social distancing) in an effort to limit this patient's exposure and mitigate transmission in our community. This patient is at least at moderate risk for complications without adequate follow up. This format is felt to be most appropriate for this patient at this time. Physical exam was limited by quality of the video and audio technology used for the visit. Sima Matas was able to get the patient set up on a video visit.  Patient location: Home Patient and provider in visit Provider location: Office  I discussed the limitations of evaluation and management by telemedicine and the availability of in person appointments. The patient expressed understanding and agreed to proceed.  Visit Date: 08/29/2021  Today's healthcare provider: Donato Schultz, DO     Subjective:    Patient ID: Suzanne Singh, female    DOB: 1966/05/25, 55 y.o.   MRN: [id number]  Chief Complaint  Patient presents with   Abdominal Pain    Complains of lower abdominal pain   Back Pain    Complains of low back pain    Joint Swelling    Complains of swelling on both ankles, more on the right   Covid Positive    Tested positive for covid on 9-12, still has a cough, congestion and headache.     HPI Patient is in today for a video visit.  She complain of urinary tract infection that started about few days ago. Associated symptoms are urgency, frequency cough congestion, sinus pressure and headache. She denies fever, dysuria , back pain at this time. She tried over the counter Mucinex and had mild relief. She recently tested positive for Covid-19 on 08/19/2021.   Past Medical History:  Diagnosis Date   Arrhythmia    Arthritis    Depression    Eating disorder    Fibromyalgia    GERD (gastroesophageal reflux disease)    IBS  (irritable bowel syndrome)    Migraine    Seizures (HCC)     No past surgical history on file.  Family History  Problem Relation Age of Onset   Early death Sister    Alcohol abuse Mother    Arthritis Mother    Lung cancer Mother    Hypertension Mother    Mental illness Mother    Alcohol abuse Father    Heart disease Father    Hypertension Father    Diabetes Father    Colon cancer Father    Arthritis Maternal Grandmother    Hypertension Maternal Grandmother    Breast cancer Maternal Grandmother    Stroke Maternal Grandfather    Hypertension Maternal Grandfather    Heart disease Paternal Grandmother    Hypertension Paternal Grandmother    Colon cancer Paternal Grandfather    Heart disease Paternal Grandfather    Hypertension Paternal Grandfather    Kidney disease Paternal Grandfather    Diabetes Paternal Grandfather     Social History   Socioeconomic History   Marital status: Married    Spouse name: Not on file   Number of children: 2   Years of education: Not on file   Highest education level: Not on file  Occupational History   Not on file  Tobacco Use   Smoking status: Never   Smokeless tobacco: Never  Substance and Sexual Activity   Alcohol use: No  Alcohol/week: 0.0 standard drinks    Comment: wine on occasion   Drug use: No   Sexual activity: Not on file  Other Topics Concern   Not on file  Social History Narrative   Middle school English teacher, [employer]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   2 sons      Exercise-no   Social Determinants of Corporate investment banker Strain: Not on file  Food Insecurity: Not on file  Transportation Needs: Not on file  Physical Activity: Not on file  Stress: Not on file  Social Connections: Not on file  Intimate Partner Violence: Not on file    Outpatient Medications Prior to Visit  Medication Sig Dispense Refill   albuterol (PROAIR HFA) 108 (90 Base)  MCG/ACT inhaler Inhale 2 puffs into the lungs every 6 (six) hours as needed for wheezing or shortness of breath. 18 g 3   diazepam (VALIUM) 5 MG tablet Take 1 tablet (5 mg total) by mouth every 12 (twelve) hours as needed for anxiety. 30 tablet 1   diclofenac sodium (VOLTAREN) 1 % GEL Apply 4 g topically 4 (four) times daily. 100 g 2   FLUoxetine (PROZAC) 20 MG tablet Take 1 tablet (20 mg total) by mouth daily. 90 tablet 3   fluticasone (FLONASE) 50 MCG/ACT nasal spray Place 2 sprays into both nostrils daily. 16 g 6   Ibuprofen-Famotidine 800-26.6 MG TABS Take one tablet po up to three times per day for joint pain. 90 tablet 3   losartan (COZAAR) 50 MG tablet Take 1 tablet (50 mg total) by mouth daily. 90 tablet 0   Olopatadine HCl (PATADAY) 0.2 % SOLN Place 1 drop into both eyes daily as needed. 2.5 mL 5   rizatriptan (MAXALT-MLT) 10 MG disintegrating tablet Take 1 tablet (10 mg total) by mouth as needed for migraine. May repeat in 2 hours if needed 10 tablet 0   Facility-Administered Medications Prior to Visit  Medication Dose Route Frequency Provider Last Rate Last Admin   lidocaine (PF) (XYLOCAINE) 1 % injection 2 mL  2 mL Other Once Tyrell Antonio, MD        Allergies  Allergen Reactions   Ambien Cr [Zolpidem Tartrate Er]     Seizure    Cymbalta [Duloxetine Hcl]     Seizure    Celebrex [Celecoxib]     Hives    Hydrocodone Itching   Latex Rash    Adhesive    Review of Systems  Constitutional:  Negative for fever.  HENT:  Positive for congestion and sinus pain.   Respiratory:  Positive for cough.   Genitourinary:  Positive for frequency and urgency. Negative for dysuria.  Musculoskeletal:  Negative for back pain.  Neurological:  Positive for headaches.      Objective:    Physical Exam Constitutional:      Appearance: Normal appearance.  HENT:     Head: Normocephalic and atraumatic.     Right Ear: External ear normal.     Left Ear: External ear normal.   Neurological:     Mental Status: She is alert.  Psychiatric:        Behavior: Behavior normal.        Judgment: Judgment normal.    BP 117/83   Pulse 80  Wt Readings from Last 3 Encounters:  03/21/21 213 lb (96.6 kg)  11/21/20 213 lb (96.6 kg)  11/15/20 213 lb (96.6 kg)    Diabetic Foot Exam -  Simple   No data filed    Lab Results  Component Value Date   WBC 3.1 (L) 03/25/2021   HGB 10.9 (L) 03/25/2021   HCT 32.9 (L) 03/25/2021   PLT 268.0 03/25/2021   GLUCOSE 82 03/25/2021   CHOL 191 02/10/2019   TRIG 51.0 02/10/2019   HDL 63.50 02/10/2019   LDLCALC 118 (H) 02/10/2019   ALT 11 03/25/2021   AST 17 03/25/2021   NA 139 03/25/2021   K 4.0 03/25/2021   CL 104 03/25/2021   CREATININE 0.69 03/25/2021   BUN 17 03/25/2021   CO2 28 03/25/2021   TSH 1.96 03/25/2021    Lab Results  Component Value Date   TSH 1.96 03/25/2021   Lab Results  Component Value Date   WBC 3.1 (L) 03/25/2021   HGB 10.9 (L) 03/25/2021   HCT 32.9 (L) 03/25/2021   MCV 88.9 03/25/2021   PLT 268.0 03/25/2021   Lab Results  Component Value Date   NA 139 03/25/2021   K 4.0 03/25/2021   CO2 28 03/25/2021   GLUCOSE 82 03/25/2021   BUN 17 03/25/2021   CREATININE 0.69 03/25/2021   BILITOT 0.4 03/25/2021   ALKPHOS 79 03/25/2021   AST 17 03/25/2021   ALT 11 03/25/2021   PROT 6.2 03/25/2021   ALBUMIN 3.5 03/25/2021   CALCIUM 8.7 03/25/2021   GFR 99.22 03/25/2021   Lab Results  Component Value Date   CHOL 191 02/10/2019   Lab Results  Component Value Date   HDL 63.50 02/10/2019   Lab Results  Component Value Date   LDLCALC 118 (H) 02/10/2019   Lab Results  Component Value Date   TRIG 51.0 02/10/2019   Lab Results  Component Value Date   CHOLHDL 3 02/10/2019   No results found for: HGBA1C     Assessment & Plan:   Problem List Items Addressed This Visit   None Visit Diagnoses     Acute non-recurrent pansinusitis    -  Primary   Relevant Medications    amoxicillin-clavulanate (AUGMENTIN) 875-125 MG tablet     Con't mucinex  Rto prn     Meds ordered this encounter  Medications   amoxicillin-clavulanate (AUGMENTIN) 875-125 MG tablet    Sig: Take 1 tablet by mouth 2 (two) times daily.    Dispense:  20 tablet    Refill:  0    I discussed the assessment and treatment plan with the patient. The patient was provided an opportunity to ask questions and all were answered. The patient agreed with the plan and demonstrated an understanding of the instructions.   The patient was advised to call back or seek an in-person evaluation if the symptoms worsen or if the condition fails to improve as anticipated.  I provided 20 minutes of face-to-face time during this encounter.   I,Savera Zaman,acting as a Neurosurgeon for [employer], DO.,have documented all relevant documentation on the behalf of Donato Schultz, DO,as directed by  Donato Schultz, DO while in the presence of Donato Schultz, DO.   Donato Schultz, DO [facility] [phone] (phone) [phone] (fax)  [facility]

## 2021-09-12 ENCOUNTER — Encounter: Payer: BC Managed Care – PPO | Admitting: Family Medicine

## 2021-09-20 ENCOUNTER — Emergency Department (HOSPITAL_COMMUNITY): Payer: BC Managed Care – PPO

## 2021-09-20 ENCOUNTER — Emergency Department (HOSPITAL_COMMUNITY)
Admission: EM | Admit: 2021-09-20 | Discharge: 2021-09-20 | Disposition: A | Discharge status: Home / Self Care | Payer: BC Managed Care – PPO | Attending: Emergency Medicine | Admitting: Emergency Medicine

## 2021-09-20 ENCOUNTER — Telehealth: Payer: Self-pay | Admitting: Family Medicine

## 2021-09-20 DIAGNOSIS — Z91040 Latex allergy status: Secondary | ICD-10-CM | POA: Diagnosis not present

## 2021-09-20 DIAGNOSIS — M791 Myalgia, unspecified site: Secondary | ICD-10-CM

## 2021-09-20 DIAGNOSIS — S199XXA Unspecified injury of neck, initial encounter: Secondary | ICD-10-CM | POA: Diagnosis present

## 2021-09-20 DIAGNOSIS — M50321 Other cervical disc degeneration at C4-C5 level: Secondary | ICD-10-CM | POA: Insufficient documentation

## 2021-09-20 DIAGNOSIS — Y92410 Unspecified street and highway as the place of occurrence of the external cause: Secondary | ICD-10-CM | POA: Insufficient documentation

## 2021-09-20 DIAGNOSIS — M50323 Other cervical disc degeneration at C6-C7 level: Secondary | ICD-10-CM | POA: Insufficient documentation

## 2021-09-20 DIAGNOSIS — S0990XA Unspecified injury of head, initial encounter: Secondary | ICD-10-CM | POA: Insufficient documentation

## 2021-09-20 DIAGNOSIS — I10 Essential (primary) hypertension: Secondary | ICD-10-CM | POA: Diagnosis not present

## 2021-09-20 DIAGNOSIS — R519 Headache, unspecified: Secondary | ICD-10-CM

## 2021-09-20 DIAGNOSIS — S161XXA Strain of muscle, fascia and tendon at neck level, initial encounter: Secondary | ICD-10-CM

## 2021-09-20 MED ORDER — METOCLOPRAMIDE HCL 10 MG PO TABS
10.0000 mg | ORAL_TABLET | Freq: Once | ORAL | Status: DC
  Filled 2021-09-20: qty 1

## 2021-09-20 MED ORDER — IOHEXOL 350 MG/ML SOLN
100.0000 mL | Freq: Once | INTRAVENOUS | Status: AC | PRN
  Administered 2021-09-20: 100 mL via INTRAVENOUS

## 2021-09-20 MED ORDER — METHOCARBAMOL 500 MG PO TABS
500.0000 mg | ORAL_TABLET | Freq: Once | ORAL | Status: DC
  Filled 2021-09-20: qty 1

## 2021-09-20 MED ORDER — METHOCARBAMOL 500 MG PO TABS: 500.0000 mg | ORAL_TABLET | Freq: Two times a day (BID) | ORAL | 0 refills | Status: DC

## 2021-09-20 MED ORDER — ACETAMINOPHEN 500 MG PO TABS
1000.0000 mg | ORAL_TABLET | Freq: Once | ORAL | Status: AC
  Administered 2021-09-20: 1000 mg via ORAL
  Filled 2021-09-20: qty 2

## 2021-09-20 MED ORDER — DIPHENHYDRAMINE HCL 25 MG PO CAPS
25.0000 mg | ORAL_CAPSULE | Freq: Once | ORAL | Status: DC
  Filled 2021-09-20: qty 1

## 2021-09-20 NOTE — Discharge Instructions (Signed)
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
## 2021-09-20 NOTE — Telephone Encounter (Signed)
Looks like patient headed to ER.  Comments User: Matt Holmes, RN Date/Time Lamount Cohen Time): 09/20/2021 8:34:28 AM Pt. was hesitant at first, but will go to ER
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
## 2021-10-02 ENCOUNTER — Telehealth: Payer: Self-pay | Admitting: Family Medicine
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
## 2021-10-03 ENCOUNTER — Encounter: Payer: Self-pay | Admitting: Family Medicine

## 2021-10-03 ENCOUNTER — Ambulatory Visit: Payer: BC Managed Care – PPO | Admitting: Family Medicine

## 2021-10-03 ENCOUNTER — Other Ambulatory Visit: Payer: Self-pay

## 2021-10-03 VITALS — BP 140/100 | HR 92 | Temp 98.9°F | Resp 18 | Ht 70.0 in | Wt 218.6 lb

## 2021-10-03 DIAGNOSIS — S134XXA Sprain of ligaments of cervical spine, initial encounter: Secondary | ICD-10-CM | POA: Diagnosis not present

## 2021-10-03 DIAGNOSIS — F418 Other specified anxiety disorders: Secondary | ICD-10-CM

## 2021-10-03 DIAGNOSIS — F32A Depression, unspecified: Secondary | ICD-10-CM

## 2021-10-03 DIAGNOSIS — F419 Anxiety disorder, unspecified: Secondary | ICD-10-CM | POA: Diagnosis not present

## 2021-10-03 MED ORDER — PREDNISONE 10 MG PO TABS: ORAL_TABLET | ORAL | 0 refills | Status: DC

## 2021-10-03 MED ORDER — FLUOXETINE HCL 20 MG PO TABS: 20.0000 mg | ORAL_TABLET | Freq: Every day | ORAL | 3 refills | Status: DC

## 2021-10-03 MED ORDER — TRAMADOL HCL 50 MG PO TABS
50.0000 mg | ORAL_TABLET | Freq: Three times a day (TID) | ORAL | 0 refills | Status: AC | PRN
Start: 2021-10-03 — End: 2021-10-08

## 2021-10-03 MED ORDER — CYCLOBENZAPRINE HCL 10 MG PO TABS: 10.0000 mg | ORAL_TABLET | Freq: Three times a day (TID) | ORAL | 0 refills | Status: DC | PRN

## 2021-10-03 MED ORDER — KETOROLAC TROMETHAMINE 60 MG/2ML IM SOLN
60.0000 mg | Freq: Once | INTRAMUSCULAR | Status: AC
  Administered 2021-10-03: 60 mg via INTRAMUSCULAR

## 2021-10-03 NOTE — Assessment & Plan Note (Signed)
Stable Refill prozac

## 2021-10-03 NOTE — Patient Instructions (Signed)
Cervical Sprain A cervical sprain is a stretch or tear in one or more of the ligaments in the neck. Ligaments are the tissues that connect bones. Cervical sprains can range from mild to severe. Severe cervical sprains can cause the spinal bones (vertebrae) in the neck to be unstable. This can result in spinal cord damage and in serious nervous system problems. The time that it takes for a cervical sprain to heal depends on the cause and extent of the injury. Most cervical sprains heal in 4-6 weeks. What are the causes? Cervical sprains may be caused by trauma, such as an injury from a motor vehicle accident, a fall, or a sudden forward and backward whipping movement of the head and neck (whiplash injury). Mild cervical sprains may be caused by wear and tear over time. What increases the risk? The following factors may make you more likely to develop this condition: Participating in activities that have a high risk of trauma to the neck. These include contact sports, auto racing, gymnastics, and diving. Taking risks when driving or riding in a motor vehicle. Osteoarthritis of the spine. Poor strength and flexibility of the neck. A previous neck injury. Poor posture. Spending long periods in certain positions that put stress on the neck, such as sitting at a computer for a long time. What are the signs or symptoms? Symptoms of this condition include: Pain, soreness, stiffness, tenderness, swelling, or a burning sensation in the front, back, or sides of the neck, shoulders, or upper back. Sudden tightening of neck muscles (spasms). Limited ability to move the neck. Headache. Dizziness. Nausea or vomiting. Weakness, numbness, or tingling in a hand or an arm. Symptoms may develop right away after injury, or they may develop over a few days. In some cases, symptoms may go away with treatment and return (recur) over time. How is this diagnosed? This condition may be diagnosed based on: Your  medical history. Your symptoms. Any recent injuries or known neck problems that you have, such as arthritis in the neck. A physical exam. Imaging tests, such as X-rays, MRI, and CT scan. How is this treated? This condition is treated by resting and icing the injured area and doing physical therapy exercises. Heat therapy may be used 2-3 days after the injury occurred if there is no swelling. Depending on the severity of your condition, treatment may also include: Keeping your neck in place (immobilized) for periods of time. This may be done using: A cervical collar. This supports your chin and the back of your head. A cervical traction device. This is a sling that holds up your head. The device removes weight and pressure from your neck, and it may help to relieve pain. Medicines that help to relieve pain and inflammation. Medicines that help to relax your muscles (muscle relaxants). Surgery. This is rare. Follow these instructions at home: Medicines  Take over-the-counter and prescription medicines only as told by your health care provider. Ask your health care provider if the medicine prescribed to you: Requires you to avoid driving or using heavy machinery. Can cause constipation. You may need to take these actions to prevent or treat constipation: Drink enough fluid to keep your urine pale yellow. Take over-the-counter or prescription medicines. Eat foods that are high in fiber, such as beans, whole grains, and fresh fruits and vegetables. Limit foods that are high in fat and processed sugars, such as fried or sweet foods. If you have a cervical collar: Wear the collar as told by your   health care provider. Do not remove it unless told. Ask before making any adjustments to your collar. If you have long hair, keep it outside of the collar. Ask your health care provider if you may remove the collar for cleaning and bathing. If so: Follow instructions about how to remove it  safely. Clean it by hand with mild soap and water and air-dry it completely. If your collar has removable pads, remove them every 1-2 days and wash them by hand with soap and water. Let them air-dry completely before putting them back in the collar. Tell your health care provider if your skin under the collar has irritation or sores. Managing pain, stiffness, and swelling   If directed, use a cervical traction device as told. If directed, put ice on the affected area. To do this: Put ice in a plastic bag. Place a towel between your skin and the bag. Leave the ice on for 20 minutes, 2-3 times a day. If directed, apply heat to the affected area before you do your physical therapy or as often as told by your health care provider. Use the heat source that your health care provider recommends, such as a moist heat pack or a heating pad. Place a towel between your skin and the heat source. Leave the heat on for 20-30 minutes. Remove the heat if your skin turns bright red. This is especially important if you are unable to feel pain, heat, or cold. You may have a greater risk of getting burned. Activity Do not drive while wearing a cervical collar. If you do not have a cervical collar, ask if it is safe to drive while your neck heals. Do not lift anything that is heavier than 10 lb (4.5 kg), or the limit that you are told, until your health care provider says that it is safe. Rest as told by your health care provider. If physical therapy was prescribed, do exercises as told by your health care provider or physical therapist. Return to your normal activities as told by your health care provider. Avoid positions and activities that make your symptoms worse. Ask your health care provider what activities are safe for you. General instructions Do not use any products that contain nicotine or tobacco, such as cigarettes, e-cigarettes, and chewing tobacco. These can delay healing. If you need help quitting,  ask your health care provider. Keep all follow-up visits as told by your health care provider or physical therapist. This is important. How is this prevented? To prevent a cervical sprain from happening again: Use and maintain good posture. Make any needed adjustments to your workstation to help you do this. Exercise regularly as told by your health care provider or physical therapist. Avoid risky activities that may cause a cervical sprain. Contact a health care provider if you have: Symptoms that get worse or do not get better after 2 weeks of treatment. Pain that gets worse or does not get better with medicine. New, unexplained symptoms. Sores or irritated skin on your neck from wearing your cervical collar. Get help right away if: You have severe pain. You develop numbness, tingling, or weakness in any part of your body. You cannot move a part of your body (you have paralysis). You have neck pain along with severe dizziness or headache. Summary A cervical sprain is a stretch or tear in one or more of the ligaments in the neck. Cervical sprains may be caused by trauma, such as an injury from a motor vehicle accident, a   fall, or a sudden forward and backward whipping movement of the head and neck (whiplash injury). Symptoms may develop right away after injury, or they may develop over a few days. This condition may be treated with rest, ice, heat, medicines, physical therapy, and surgery. This information is not intended to replace advice given to you by your health care provider. Make sure you discuss any questions you have with your health care provider. Document Revised: 08/03/2019 Document Reviewed: 08/03/2019 Elsevier Patient Education  2022 [employer]  
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
## 2021-10-03 NOTE — Assessment & Plan Note (Signed)
Er visit reviewed and imaging pred taper  toradol  60 mg IM Refill tramadol Ice alt heat  Consider PT if symptoms do not improve

## 2021-11-28 ENCOUNTER — Ambulatory Visit (INDEPENDENT_AMBULATORY_CARE_PROVIDER_SITE_OTHER): Payer: BC Managed Care – PPO | Admitting: Family Medicine

## 2021-11-28 ENCOUNTER — Encounter: Payer: Self-pay | Admitting: Family Medicine

## 2021-11-28 VITALS — BP 110/80 | HR 83 | Temp 98.5°F | Resp 18 | Ht 70.0 in | Wt 214.8 lb

## 2021-11-28 DIAGNOSIS — Z1159 Encounter for screening for other viral diseases: Secondary | ICD-10-CM | POA: Diagnosis not present

## 2021-11-28 DIAGNOSIS — F419 Anxiety disorder, unspecified: Secondary | ICD-10-CM | POA: Diagnosis not present

## 2021-11-28 DIAGNOSIS — Z0000 Encounter for general adult medical examination without abnormal findings: Secondary | ICD-10-CM

## 2021-11-28 DIAGNOSIS — R102 Pelvic and perineal pain: Secondary | ICD-10-CM | POA: Diagnosis not present

## 2021-11-28 DIAGNOSIS — Z23 Encounter for immunization: Secondary | ICD-10-CM | POA: Diagnosis not present

## 2021-11-28 DIAGNOSIS — F32A Depression, unspecified: Secondary | ICD-10-CM

## 2021-11-28 LAB — POC URINALSYSI DIPSTICK (AUTOMATED)
Bilirubin, UA: NEGATIVE
Blood, UA: NEGATIVE
Glucose, UA: NEGATIVE
Ketones, UA: 40
Leukocytes, UA: NEGATIVE
Nitrite, UA: NEGATIVE
Protein, UA: NEGATIVE
Spec Grav, UA: 1.02 (ref 1.010–1.025)
Urobilinogen, UA: 0.2 E.U./dL
pH, UA: 6 (ref 5.0–8.0)
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
## 2021-11-28 NOTE — Patient Instructions (Signed)

## 2021-11-29 LAB — COMPREHENSIVE METABOLIC PANEL
ALT: 12 U/L (ref 0–35)
AST: 20 U/L (ref 0–37)
Albumin: 4.1 g/dL (ref 3.5–5.2)
Alkaline Phosphatase: 82 U/L (ref 39–117)
BUN: 20 mg/dL (ref 6–23)
CO2: 26 mEq/L (ref 19–32)
Calcium: 9.8 mg/dL (ref 8.4–10.5)
Chloride: 100 mEq/L (ref 96–112)
Creatinine, Ser: 0.77 mg/dL (ref 0.40–1.20)
GFR: 87.77 mL/min (ref >60.00)
Glucose, Bld: 75 mg/dL (ref 70–99)
Potassium: 4.3 mEq/L (ref 3.5–5.1)
Sodium: 136 mEq/L (ref 135–145)
Total Bilirubin: 0.4 mg/dL (ref 0.2–1.2)
Total Protein: 7 g/dL (ref 6.0–8.3)

## 2021-11-29 LAB — HEPATITIS C ANTIBODY
Hepatitis C Ab: NONREACTIVE
SIGNAL TO CUT-OFF: <0.02 (ref <1.00)

## 2021-11-29 LAB — LIPID PANEL
Cholesterol: 195 mg/dL (ref 0–200)
HDL: 59.1 mg/dL (ref >39.00)
LDL Cholesterol: 125 mg/dL — ABNORMAL HIGH (ref 0–99)
NonHDL: 135.59
Total CHOL/HDL Ratio: 3
Triglycerides: 55 mg/dL (ref 0.0–149.0)
VLDL: 11 mg/dL (ref 0.0–40.0)

## 2021-11-29 LAB — CBC WITH DIFFERENTIAL/PLATELET
Basophils Absolute: 0 10*3/uL (ref 0.0–0.1)
Basophils Relative: 1 % (ref 0.0–3.0)
Eosinophils Absolute: 0 10*3/uL (ref 0.0–0.7)
Eosinophils Relative: 1.1 % (ref 0.0–5.0)
HCT: 35.1 % — ABNORMAL LOW (ref 36.0–46.0)
Hemoglobin: 11.4 g/dL — ABNORMAL LOW (ref 12.0–15.0)
Lymphocytes Relative: 39 % (ref 12.0–46.0)
Lymphs Abs: 1.4 10*3/uL (ref 0.7–4.0)
MCHC: 32.6 g/dL (ref 30.0–36.0)
MCV: 89.8 fl (ref 78.0–100.0)
Monocytes Absolute: 0.5 10*3/uL (ref 0.1–1.0)
Monocytes Relative: 12.8 % — ABNORMAL HIGH (ref 3.0–12.0)
Neutro Abs: 1.6 10*3/uL (ref 1.4–7.7)
Neutrophils Relative %: 46.1 % (ref 43.0–77.0)
Platelets: 271 10*3/uL (ref 150.0–400.0)
RBC: 3.91 Mil/uL (ref 3.87–5.11)
RDW: 12.9 % (ref 11.5–15.5)
WBC: 3.5 10*3/uL — ABNORMAL LOW (ref 4.0–10.5)

## 2021-11-29 LAB — TSH: TSH: 2.19 u[IU]/mL (ref 0.35–5.50)

## 2021-11-29 NOTE — Assessment & Plan Note (Signed)
ghm utd Check labs See AVS 

## 2021-11-29 NOTE — Assessment & Plan Note (Signed)
Stable--- con't prozac   

## 2021-11-30 ENCOUNTER — Encounter: Payer: Self-pay | Admitting: Family Medicine

## 2021-12-10 ENCOUNTER — Other Ambulatory Visit: Payer: Self-pay | Admitting: Family Medicine

## 2021-12-10 DIAGNOSIS — D649 Anemia, unspecified: Secondary | ICD-10-CM

## 2022-03-16 ENCOUNTER — Encounter: Payer: Self-pay | Admitting: Family Medicine

## 2022-03-17 ENCOUNTER — Ambulatory Visit (INDEPENDENT_AMBULATORY_CARE_PROVIDER_SITE_OTHER): Payer: BC Managed Care – PPO | Admitting: Family Medicine

## 2022-03-17 ENCOUNTER — Encounter: Payer: Self-pay | Admitting: Family Medicine

## 2022-03-17 VITALS — BP 130/78 | HR 102 | Temp 99.2°F | Resp 18 | Ht 70.0 in | Wt 210.6 lb

## 2022-03-17 DIAGNOSIS — R1310 Dysphagia, unspecified: Secondary | ICD-10-CM

## 2022-03-17 DIAGNOSIS — R0789 Other chest pain: Secondary | ICD-10-CM | POA: Diagnosis not present

## 2022-03-17 LAB — COMPREHENSIVE METABOLIC PANEL
ALT: 15 U/L (ref 0–35)
AST: 24 U/L (ref 0–37)
Albumin: 4 g/dL (ref 3.5–5.2)
Alkaline Phosphatase: 98 U/L (ref 39–117)
BUN: 19 mg/dL (ref 6–23)
CO2: 32 mEq/L (ref 19–32)
Calcium: 9.9 mg/dL (ref 8.4–10.5)
Chloride: 103 mEq/L (ref 96–112)
Creatinine, Ser: 0.81 mg/dL (ref 0.40–1.20)
GFR: 82.42 mL/min (ref >60.00)
Glucose, Bld: 97 mg/dL (ref 70–99)
Potassium: 3.8 mEq/L (ref 3.5–5.1)
Sodium: 142 mEq/L (ref 135–145)
Total Bilirubin: 0.3 mg/dL (ref 0.2–1.2)
Total Protein: 7.1 g/dL (ref 6.0–8.3)

## 2022-03-17 LAB — CBC WITH DIFFERENTIAL/PLATELET
Basophils Absolute: 0 10*3/uL (ref 0.0–0.1)
Basophils Relative: 0.3 % (ref 0.0–3.0)
Eosinophils Absolute: 0 10*3/uL (ref 0.0–0.7)
Eosinophils Relative: 0.9 % (ref 0.0–5.0)
HCT: 34.7 % — ABNORMAL LOW (ref 36.0–46.0)
Hemoglobin: 11.4 g/dL — ABNORMAL LOW (ref 12.0–15.0)
Lymphocytes Relative: 28.8 % (ref 12.0–46.0)
Lymphs Abs: 1.4 10*3/uL (ref 0.7–4.0)
MCHC: 32.9 g/dL (ref 30.0–36.0)
MCV: 88.5 fl (ref 78.0–100.0)
Monocytes Absolute: 0.5 10*3/uL (ref 0.1–1.0)
Monocytes Relative: 10.3 % (ref 3.0–12.0)
Neutro Abs: 2.9 10*3/uL (ref 1.4–7.7)
Neutrophils Relative %: 59.7 % (ref 43.0–77.0)
Platelets: 304 10*3/uL (ref 150.0–400.0)
RBC: 3.92 Mil/uL (ref 3.87–5.11)
RDW: 13.6 % (ref 11.5–15.5)
WBC: 4.8 10*3/uL (ref 4.0–10.5)

## 2022-03-17 LAB — D-DIMER, QUANTITATIVE: D-Dimer, Quant: 0.33 mcg/mL FEU (ref <0.50)

## 2022-03-17 LAB — TSH: TSH: 1.93 u[IU]/mL (ref 0.35–5.50)

## 2022-03-17 LAB — TROPONIN I (HIGH SENSITIVITY): High Sens Troponin I: 9 ng/L (ref 2–17)

## 2022-03-17 MED ORDER — FAMOTIDINE 20 MG PO TABS: 20.0000 mg | ORAL_TABLET | Freq: Two times a day (BID) | ORAL | 3 refills | Status: AC

## 2022-03-17 NOTE — Progress Notes (Addendum)
? ?Subjective:  ? ?By signing my name below, I, Suzanne Singh, attest that this documentation has been prepared under the direction and in the presence of [name], 03/17/2022  ? ? Patient ID: Suzanne Singh, female    DOB: [birth year], 56 y.o.   MRN: [id number] ? ?Chief Complaint  ?Patient presents with  ? GI Problem  ?  Pt states having pressure when drinks or swallows. Pt states having pressure in her throat and left chest. Pt states no SOB, dizziness, or blurred vision.   ? ? ?HPI ?Patient is in today for an office visit.  ? ?She complains of pressure in her chest and throat that begun about a week ago. She states that she struggles to eat. She states that it feels as though there is something lodged in her chest. She has been having palpations but she contributes them to her recent onset of menopause. She denies of any SOB and abdominal pain.  ? ?She also complains of left lower back paint that radiates downward. She denies of any recent injuries. ? ?Past Medical History:  ?Diagnosis Date  ? Arrhythmia   ? Arthritis   ? Depression   ? Eating disorder   ? Fibromyalgia   ? GERD (gastroesophageal reflux disease)   ? IBS (irritable bowel syndrome)   ? Migraine   ? Seizures (HCC)   ? ? ?Past Surgical History:  ?Procedure Laterality Date  ? myomectomy Bilateral 04/2021  ? ? ?Family History  ?Problem Relation Age of Onset  ? Early death Sister   ? Alcohol abuse Mother   ? Arthritis Mother   ? Lung cancer Mother   ? Hypertension Mother   ? Mental illness Mother   ? Alcohol abuse Father   ? Heart disease Father   ? Hypertension Father   ? Diabetes Father   ? Colon cancer Father   ? Arthritis Maternal Grandmother   ? Hypertension Maternal Grandmother   ? Breast cancer Maternal Grandmother   ? Stroke Maternal Grandfather   ? Hypertension Maternal Grandfather   ? Heart disease Paternal Grandmother   ? Hypertension Paternal Grandmother   ? Colon cancer Paternal Grandfather   ? Heart disease Paternal Grandfather    ? Hypertension Paternal Grandfather   ? Kidney disease Paternal Grandfather   ? Diabetes Paternal Grandfather   ? ? ?Social History  ? ?Socioeconomic History  ? Marital status: Married  ?  Spouse name: Not on file  ? Number of children: 2  ? Years of education: Not on file  ? Highest education level: Not on file  ?Occupational History  ? Not on file  ?Tobacco Use  ? Smoking status: Never  ? Smokeless tobacco: Never  ?Substance and Sexual Activity  ? Alcohol use: No  ?  Alcohol/week: 0.0 standard drinks  ?  Comment: wine on occasion  ? Drug use: No  ? Sexual activity: Not Currently  ?Other Topics Concern  ? Not on file  ?Social History Narrative  ? 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  ? 2 sons  ?   ? Exercise-no  ? ?Social Determinants of Health  ? ?Financial Resource Strain: Not on file  ?Food Insecurity: Not on file  ?Transportation Needs: Not on file  ?Physical Activity: Not on file  ?Stress: Not on file  ?Social Connections: Not on file  ?Intimate Partner Violence: Not on file  ? ? ?Outpatient Medications Prior to Visit  ?  Medication Sig Dispense Refill  ? albuterol (PROAIR HFA) 108 (90 Base) MCG/ACT inhaler Inhale 2 puffs into the lungs every 6 (six) hours as needed for wheezing or shortness of breath. 18 g 3  ? cyclobenzaprine (FLEXERIL) 10 MG tablet Take 1 tablet (10 mg total) by mouth 3 (three) times daily as needed for muscle spasms. 30 tablet 0  ? diazepam (VALIUM) 5 MG tablet Take 1 tablet (5 mg total) by mouth every 12 (twelve) hours as needed for anxiety. 30 tablet 1  ? diclofenac sodium (VOLTAREN) 1 % GEL Apply 4 g topically 4 (four) times daily. 100 g 2  ? FLUoxetine (PROZAC) 20 MG tablet Take 1 tablet (20 mg total) by mouth daily. 90 tablet 3  ? fluticasone (FLONASE) 50 MCG/ACT nasal spray Place 2 sprays into both nostrils daily. 16 g 6  ? Olopatadine HCl (PATADAY) 0.2 % SOLN Place 1 drop into both eyes daily as  needed. 2.5 mL 5  ? Ibuprofen-Famotidine 800-26.6 MG TABS Take one tablet po up to three times per day for joint pain. 90 tablet 3  ? ?Facility-Administered Medications Prior to Visit  ?Medication Dose Route Frequency Provider Last Rate Last Admin  ? lidocaine (PF) (XYLOCAINE) 1 % injection 2 mL  2 mL Other Once Tyrell AntonioNewton, Frederic, MD      ? ? ?Allergies  ?Allergen Reactions  ? Ambien Cr [Zolpidem Tartrate Er]   ?  Seizure ?  ? Cymbalta [Duloxetine Hcl]   ?  Seizure ?  ? Celebrex [Celecoxib]   ?  Hives ?  ? Hydrocodone Itching  ? Latex Rash  ?  Adhesive  ? Mobic [Meloxicam] Palpitations  ? ? ?Review of Systems  ?Constitutional:  Negative for fever and malaise/fatigue.  ?HENT:  Negative for congestion.   ?Eyes:  Negative for blurred vision.  ?Respiratory:  Negative for cough and shortness of breath.   ?Cardiovascular:  Positive for palpitations. Negative for chest pain and leg swelling.  ?Gastrointestinal:  Positive for abdominal pain and heartburn. Negative for vomiting.  ?Musculoskeletal:  Positive for back pain (Left side of lower back).  ?Skin:  Negative for rash.  ?Neurological:  Negative for loss of consciousness and headaches.  ?Psychiatric/Behavioral: Negative.    ? ?   ?Objective:  ?  ?Physical Exam ?Vitals and nursing note reviewed.  ?Constitutional:   ?   General: She is not in acute distress. ?   Appearance: Normal appearance. She is not ill-appearing.  ?HENT:  ?   Head: Normocephalic and atraumatic.  ?   Right Ear: External ear normal.  ?   Left Ear: External ear normal.  ?Eyes:  ?   Extraocular Movements: Extraocular movements intact.  ?   Pupils: Pupils are equal, round, and reactive to light.  ?Cardiovascular:  ?   Rate and Rhythm: Normal rate and regular rhythm.  ?   Heart sounds: Normal heart sounds. No murmur heard. ?  No gallop.  ?Pulmonary:  ?   Effort: Pulmonary effort is normal. No respiratory distress.  ?   Breath sounds: Normal breath sounds. No wheezing or rales.  ?Abdominal:  ?   General:  There is no distension.  ?   Palpations: There is no mass.  ?   Tenderness: There is no abdominal tenderness. There is no guarding or rebound.  ?Skin: ?   General: Skin is warm and dry.  ?Neurological:  ?   Mental Status: She is alert and oriented to person, place, and time.  ?Psychiatric:     ?  Judgment: Judgment normal.  ? ? ?BP 130/78 (BP Location: Left Arm, Patient Position: Sitting, Cuff Size: Large)   Pulse (!) 102   Temp 99.2 ?F (37.3 ?C) (Oral)   Resp 18   Ht 5\' 10"  (1.778 m)   Wt 210 lb 9.6 oz (95.5 kg)   SpO2 97%   BMI 30.22 kg/m?  ?Wt Readings from Last 3 Encounters:  ?03/17/22 210 lb 9.6 oz (95.5 kg)  ?11/28/21 214 lb 12.8 oz (97.4 kg)  ?10/03/21 218 lb 9.6 oz (99.2 kg)  ? ? ?Diabetic Foot Exam - Simple   ?No data filed ?  ? ?Lab Results  ?Component Value Date  ? WBC 3.5 (L) 11/28/2021  ? HGB 11.4 (L) 11/28/2021  ? HCT 35.1 (L) 11/28/2021  ? PLT 271.0 11/28/2021  ? GLUCOSE 75 11/28/2021  ? CHOL 195 11/28/2021  ? TRIG 55.0 11/28/2021  ? HDL 59.10 11/28/2021  ? LDLCALC 125 (H) 11/28/2021  ? ALT 12 11/28/2021  ? AST 20 11/28/2021  ? NA 136 11/28/2021  ? K 4.3 11/28/2021  ? CL 100 11/28/2021  ? CREATININE 0.77 11/28/2021  ? BUN 20 11/28/2021  ? CO2 26 11/28/2021  ? TSH 2.19 11/28/2021  ? ? ?Lab Results  ?Component Value Date  ? TSH 2.19 11/28/2021  ? ?Lab Results  ?Component Value Date  ? WBC 3.5 (L) 11/28/2021  ? HGB 11.4 (L) 11/28/2021  ? HCT 35.1 (L) 11/28/2021  ? MCV 89.8 11/28/2021  ? PLT 271.0 11/28/2021  ? ?Lab Results  ?Component Value Date  ? NA 136 11/28/2021  ? K 4.3 11/28/2021  ? CO2 26 11/28/2021  ? GLUCOSE 75 11/28/2021  ? BUN 20 11/28/2021  ? CREATININE 0.77 11/28/2021  ? BILITOT 0.4 11/28/2021  ? ALKPHOS 82 11/28/2021  ? AST 20 11/28/2021  ? ALT 12 11/28/2021  ? PROT 7.0 11/28/2021  ? ALBUMIN 4.1 11/28/2021  ? CALCIUM 9.8 11/28/2021  ? GFR 87.77 11/28/2021  ? ?Lab Results  ?Component Value Date  ? CHOL 195 11/28/2021  ? ?Lab Results  ?Component Value Date  ? HDL 59.10 11/28/2021   ? ?Lab Results  ?Component Value Date  ? LDLCALC 125 (H) 11/28/2021  ? ?Lab Results  ?Component Value Date  ? TRIG 55.0 11/28/2021  ? ?Lab Results  ?Component Value Date  ? CHOLHDL 3 11/28/2021  ? ?No results found

## 2022-03-17 NOTE — Patient Instructions (Signed)
Nonspecific Chest Pain, Adult °Chest pain is an uncomfortable, tight, or painful feeling in the chest. The pain can feel like a crushing, aching, or squeezing pressure. A person can feel a burning or tingling sensation. Chest pain can also be felt in your back, neck, jaw, shoulder, or arm. This pain can be worse when you move, sneeze, or take a deep breath. °Chest pain can be caused by a condition that is life-threatening. This must be treated right away. It can also be caused by something that is not life-threatening. If you have chest pain, it can be hard to know the difference, so it is important to get help right away to make sure that you do not have a serious condition. °Some life-threatening causes of chest pain include: °Heart attack. °A tear in the body's main blood vessel (aortic dissection). °Inflammation around your heart (pericarditis). °A problem in the lungs, such as a blood clot (pulmonary embolism) or a collapsed lung (pneumothorax). °Some non life-threatening causes of chest pain include: °Heartburn. °Anxiety or stress. °Damage to the bones, muscles, and cartilage that make up your chest wall. °Pneumonia or bronchitis. °Shingles infection (varicella-zoster virus). °Your chest pain may come and go. It may also be constant. Your health care provider will do tests and other studies to find the cause of your pain. Treatment will depend on the cause of your chest pain. °Follow these instructions at home: °Medicines °Take over-the-counter and prescription medicines only as told by your health care provider. °If you were prescribed an antibiotic medicine, take it as told by your health care provider. Do not stop taking the antibiotic even if you start to feel better. °Activity °Avoid any activities that cause chest pain. °Do not lift anything that is heavier than 10 lb (4.5 kg), or the limit that you are told, until your health care provider says that it is safe. °Rest as directed by your health care  provider. °Return to your normal activities only as told by your health care provider. Ask your health care provider what activities are safe for you. °Lifestyle °  °Do not use any products that contain nicotine or tobacco, such as cigarettes, e-cigarettes, and chewing tobacco. If you need help quitting, ask your health care provider. °Do not drink alcohol. °Make healthy lifestyle changes as recommended. These may include: °Getting regular exercise. Ask your health care provider to suggest some exercises that are safe for you. °Eating a heart-healthy diet. This includes plenty of fresh fruits and vegetables, whole grains, low-fat (lean) protein, and low-fat dairy products. A dietitian can help you find healthy eating options. °Maintaining a healthy weight. °Managing any other health conditions you may have, such as high blood pressure (hypertension) or diabetes. °Reducing stress, such as with yoga or relaxation techniques. °General instructions °Pay attention to any changes in your symptoms. °It is up to you to get the results of any tests that were done. Ask your health care provider, or the department that is doing the tests, when your results will be ready. °Keep all follow-up visits as told by your health care provider. This is important. °You may be asked to go for further testing if your chest pain does not go away. °Contact a health care provider if: °Your chest pain does not go away. °You feel depressed. °You have a fever. °You notice changes in your symptoms or develop new symptoms. °Get help right away if: °Your chest pain gets worse. °You have a cough that gets worse, or you cough up   blood. °You have severe pain in your abdomen. °You faint. °You have sudden, unexplained chest discomfort. °You have sudden, unexplained discomfort in your arms, back, neck, or jaw. °You have shortness of breath at any time. °You suddenly start to sweat, or your skin gets clammy. °You feel nausea or you vomit. °You suddenly  feel lightheaded or dizzy. °You have severe weakness, or unexplained weakness or fatigue. °Your heart begins to beat quickly, or it feels like it is skipping beats. °These symptoms may represent a serious problem that is an emergency. Do not wait to see if the symptoms will go away. Get medical help right away. Call your local emergency services (911 in the [place] Do not drive yourself to the hospital. °Summary °Chest pain can be caused by a condition that is serious and requires urgent treatment. It may also be caused by something that is not life-threatening. °Your health care provider may do lab tests and other studies to find the cause of your pain. °Follow your health care provider's instructions on taking medicines, making lifestyle changes, and getting emergency treatment if symptoms become worse. °Keep all follow-up visits as told by your health care provider. This includes visits for any further testing if your chest pain does not go away. °This information is not intended to replace advice given to you by your health care provider. Make sure you discuss any questions you have with your health care provider. °Document Revised: 02/07/2021 Document Reviewed: 02/07/2021 °Elsevier Patient Education © 2022 [employer] ° °

## 2022-03-17 NOTE — Assessment & Plan Note (Signed)
ekg -- nsr  ?Check labs  ?Go to ER if pain worsens  ?

## 2022-03-17 NOTE — Assessment & Plan Note (Signed)
Check labs  ?ekg -- nsr  ?If pain worsens go to ER  ?Consider GI referral  ?

## 2022-03-18 LAB — H. PYLORI ANTIBODY, IGG: H Pylori IgG: NEGATIVE

## 2022-03-19 ENCOUNTER — Encounter: Payer: Self-pay | Admitting: Family Medicine

## 2022-03-19 NOTE — Telephone Encounter (Signed)
Pt viewed results via Mychart °

## 2022-03-20 ENCOUNTER — Encounter: Payer: Self-pay | Admitting: Family Medicine

## 2022-03-20 ENCOUNTER — Telehealth (INDEPENDENT_AMBULATORY_CARE_PROVIDER_SITE_OTHER): Payer: BC Managed Care – PPO | Admitting: Family Medicine

## 2022-03-20 DIAGNOSIS — R1310 Dysphagia, unspecified: Secondary | ICD-10-CM

## 2022-03-20 DIAGNOSIS — H9313 Tinnitus, bilateral: Secondary | ICD-10-CM | POA: Diagnosis not present

## 2022-03-20 DIAGNOSIS — R5383 Other fatigue: Secondary | ICD-10-CM

## 2022-03-20 DIAGNOSIS — F419 Anxiety disorder, unspecified: Secondary | ICD-10-CM

## 2022-03-20 NOTE — Progress Notes (Signed)
? ?Subjective:  ? ?By signing my name below, I, [name], attest that this documentation has been prepared under the direction and in the presence of [name], [name], DO. 03/20/2022 ?  ? ? Patient ID: Suzanne Singh, female    DOB: 04-May-1966, 56 y.o.   MRN: [id number] ? ?Chief Complaint  ?Patient presents with  ? FMLA Paperwork  ? ? ?HPI ?Patient is in today for a video visit.  ? ?She reports the ringing in her ears has worsened this past week. She has seen an ENT and neurology specialist and found no relief.  ?She continues taking iron supplements regularly. Her las lab work showed her HGB is normal. She continues feeling fatigued at this time. She finds she has no energy throughout the day. She typically stays in bed most of the day.  ?Lab Results  ?Component Value Date  ? WBC 4.8 03/17/2022  ? HGB 11.4 (L) 03/17/2022  ? HCT 34.7 (L) 03/17/2022  ? MCV 88.5 03/17/2022  ? PLT 304.0 03/17/2022  ? ?She continues experiencing pain in her joints from arthritis. She also finds her symptoms have recently worsened.  ?She continues feeling fullness in her stomach. She feels like her food is getting stuck in her stomach. She notes feeling increased stress and finds increased stress worsens her symptoms. She had similar symptoms in the past and seen a GI specialist to manage her symptoms at that time. She was recently prescribed 20 mg Pepcid 2x daily PO to manage her symptoms. She is interested in seeing a GI specialist. She has taken 5 mg valium to manage her stress but stopped due to taking too many medications. She is willing to start taking it again.  ?She reports her workplace has stopped her online work and she is to go back to working in person. She finds she physically cannot due to fatigue and other post Covid-19 symptoms she is experiencing. She is requesting for FMLA forms be filled out for her workplace so she can get treated for her symptoms before she returns to work.  ? ?She is requesting to take off  from August 2023 to June 2024.  ? ? ?Past Medical History:  ?Diagnosis Date  ? Arrhythmia   ? Arthritis   ? Depression   ? Eating disorder   ? Fibromyalgia   ? GERD (gastroesophageal reflux disease)   ? IBS (irritable bowel syndrome)   ? Migraine   ? Seizures (HCC)   ? ? ?Past Surgical History:  ?Procedure Laterality Date  ? myomectomy Bilateral 04/2021  ? ? ?Family History  ?Problem Relation Age of Onset  ? Early death Sister   ? Alcohol abuse Mother   ? Arthritis Mother   ? Lung cancer Mother   ? Hypertension Mother   ? Mental illness Mother   ? Alcohol abuse Father   ? Heart disease Father   ? Hypertension Father   ? Diabetes Father   ? Colon cancer Father   ? Arthritis Maternal Grandmother   ? Hypertension Maternal Grandmother   ? Breast cancer Maternal Grandmother   ? Stroke Maternal Grandfather   ? Hypertension Maternal Grandfather   ? Heart disease Paternal Grandmother   ? Hypertension Paternal Grandmother   ? Colon cancer Paternal Grandfather   ? Heart disease Paternal Grandfather   ? Hypertension Paternal Grandfather   ? Kidney disease Paternal Grandfather   ? Diabetes Paternal Grandfather   ? ? ?Social History  ? ?Socioeconomic History  ? Marital  status: Married  ?  Spouse name: Not on file  ? Number of children: 2  ? Years of education: Not on file  ? Highest education level: Not on file  ?Occupational History  ? Not on file  ?Tobacco Use  ? Smoking status: Never  ? Smokeless tobacco: Never  ?Substance and Sexual Activity  ? Alcohol use: No  ?  Alcohol/week: 0.0 standard drinks  ?  Comment: wine on occasion  ? Drug use: No  ? Sexual activity: Not Currently  ?Other Topics Concern  ? Not on file  ?Social History Narrative  ? 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  ? 2 sons  ?   ? Exercise-no  ? ?Social Determinants of Health  ? ?Financial Resource Strain: Not on file  ?Food Insecurity: Not on file  ?Transportation Needs:  Not on file  ?Physical Activity: Not on file  ?Stress: Not on file  ?Social Connections: Not on file  ?Intimate Partner Violence: Not on file  ? ? ?Outpatient Medications Prior to Visit  ?Medication Sig Dispense Refill  ? albuterol (PROAIR HFA) 108 (90 Base) MCG/ACT inhaler Inhale 2 puffs into the lungs every 6 (six) hours as needed for wheezing or shortness of breath. 18 g 3  ? cyclobenzaprine (FLEXERIL) 10 MG tablet Take 1 tablet (10 mg total) by mouth 3 (three) times daily as needed for muscle spasms. 30 tablet 0  ? diazepam (VALIUM) 5 MG tablet Take 1 tablet (5 mg total) by mouth every 12 (twelve) hours as needed for anxiety. 30 tablet 1  ? diclofenac sodium (VOLTAREN) 1 % GEL Apply 4 g topically 4 (four) times daily. 100 g 2  ? famotidine (PEPCID) 20 MG tablet Take 1 tablet (20 mg total) by mouth 2 (two) times daily. 60 tablet 3  ? FLUoxetine (PROZAC) 20 MG tablet Take 1 tablet (20 mg total) by mouth daily. 90 tablet 3  ? fluticasone (FLONASE) 50 MCG/ACT nasal spray Place 2 sprays into both nostrils daily. 16 g 6  ? Olopatadine HCl (PATADAY) 0.2 % SOLN Place 1 drop into both eyes daily as needed. 2.5 mL 5  ? ?Facility-Administered Medications Prior to Visit  ?Medication Dose Route Frequency Provider Last Rate Last Admin  ? lidocaine (PF) (XYLOCAINE) 1 % injection 2 mL  2 mL Other Once Tyrell Antonio, MD      ? ? ?Allergies  ?Allergen Reactions  ? Ambien Cr [Zolpidem Tartrate Er]   ?  Seizure ?  ? Cymbalta [Duloxetine Hcl]   ?  Seizure ?  ? Celebrex [Celecoxib]   ?  Hives ?  ? Hydrocodone Itching  ? Latex Rash  ?  Adhesive  ? Mobic [Meloxicam] Palpitations  ? ? ?Review of Systems  ?Constitutional:  Positive for malaise/fatigue. Negative for fever.  ?HENT:  Positive for tinnitus. Negative for congestion.   ?Eyes:  Negative for blurred vision.  ?Respiratory:  Negative for cough and shortness of breath.   ?Cardiovascular:  Negative for chest pain, palpitations and leg swelling.  ?Gastrointestinal:  Negative for  vomiting.  ?     (+)stomach fullness  ?Musculoskeletal:  Positive for joint pain (arthritis). Negative for back pain.  ?Skin:  Negative for rash.  ?Neurological:  Negative for loss of consciousness and headaches.  ? ?   ?Objective:  ?  ?Physical Exam ?Vitals and nursing note reviewed.  ?Constitutional:   ?   Appearance: She is well-developed.  ?HENT:  ?  Head: Normocephalic and atraumatic.  ?Eyes:  ?   Conjunctiva/sclera: Conjunctivae normal.  ?Neck:  ?   Thyroid: No thyromegaly.  ?   Vascular: No carotid bruit or JVD.  ?Cardiovascular:  ?   Rate and Rhythm: Normal rate and regular rhythm.  ?   Heart sounds: Normal heart sounds. No murmur heard. ?Pulmonary:  ?   Effort: Pulmonary effort is normal. No respiratory distress.  ?   Breath sounds: Normal breath sounds. No wheezing or rales.  ?Chest:  ?   Chest wall: No tenderness.  ?Musculoskeletal:  ?   Cervical back: Normal range of motion and neck supple.  ?Neurological:  ?   Mental Status: She is alert and oriented to person, place, and time.  ? ? ?There were no vitals taken for this visit. ?Wt Readings from Last 3 Encounters:  ?03/17/22 210 lb 9.6 oz (95.5 kg)  ?11/28/21 214 lb 12.8 oz (97.4 kg)  ?10/03/21 218 lb 9.6 oz (99.2 kg)  ? ? ?Diabetic Foot Exam - Simple   ?No data filed ?  ? ?Lab Results  ?Component Value Date  ? WBC 4.8 03/17/2022  ? HGB 11.4 (L) 03/17/2022  ? HCT 34.7 (L) 03/17/2022  ? PLT 304.0 03/17/2022  ? GLUCOSE 97 03/17/2022  ? CHOL 195 11/28/2021  ? TRIG 55.0 11/28/2021  ? HDL 59.10 11/28/2021  ? LDLCALC 125 (H) 11/28/2021  ? ALT 15 03/17/2022  ? AST 24 03/17/2022  ? NA 142 03/17/2022  ? K 3.8 03/17/2022  ? CL 103 03/17/2022  ? CREATININE 0.81 03/17/2022  ? BUN 19 03/17/2022  ? CO2 32 03/17/2022  ? TSH 1.93 03/17/2022  ? ? ?Lab Results  ?Component Value Date  ? TSH 1.93 03/17/2022  ? ?Lab Results  ?Component Value Date  ? WBC 4.8 03/17/2022  ? HGB 11.4 (L) 03/17/2022  ? HCT 34.7 (L) 03/17/2022  ? MCV 88.5 03/17/2022  ? PLT 304.0 03/17/2022  ? ?Lab  Results  ?Component Value Date  ? NA 142 03/17/2022  ? K 3.8 03/17/2022  ? CO2 32 03/17/2022  ? GLUCOSE 97 03/17/2022  ? BUN 19 03/17/2022  ? CREATININE 0.81 03/17/2022  ? BILITOT 0.3 03/17/2022  ? ALKP

## 2022-03-26 NOTE — Assessment & Plan Note (Signed)
Due to long covid  ? ?

## 2022-03-26 NOTE — Assessment & Plan Note (Signed)
Pt has seen ent, neuro no relief ----- she will try a dr that started a clinic for covid long haulers in [place]  ? ?

## 2022-04-16 ENCOUNTER — Ambulatory Visit (INDEPENDENT_AMBULATORY_CARE_PROVIDER_SITE_OTHER): Payer: BC Managed Care – PPO | Admitting: Nurse Practitioner

## 2022-04-16 ENCOUNTER — Encounter: Payer: Self-pay | Admitting: Nurse Practitioner

## 2022-04-16 VITALS — BP 110/74 | HR 75 | Ht 70.0 in | Wt 213.0 lb

## 2022-04-16 DIAGNOSIS — K219 Gastro-esophageal reflux disease without esophagitis: Secondary | ICD-10-CM

## 2022-04-16 DIAGNOSIS — R1310 Dysphagia, unspecified: Secondary | ICD-10-CM | POA: Diagnosis not present

## 2022-04-16 MED ORDER — OMEPRAZOLE 40 MG PO CPDR: 40.0000 mg | DELAYED_RELEASE_CAPSULE | Freq: Every day | ORAL | 3 refills | Status: DC

## 2022-04-16 NOTE — Patient Instructions (Addendum)
It has been recommended to you by your physician that you have a(n) EGD completed. Per your request, we did not schedule the procedure(s) today. Please contact our office at [phone] once you are able to have the procedure completed. You will be scheduled for a pre-visit and procedure at that time. ? ?We have sent the following medication to your pharmacy for you to pick up at your convenience: ? ?Omeprazole 40 MG capsule. Take 1 every morning. ? ? ?Thank you for trusting me with your gastrointestinal care!   ? ?Willette Cluster, NP ? ? ? ? ?BMI: ? ?If you are age 54 or older, your body mass index should be between 23-30. Your Body mass index is 30.56 kg/m?Marland Kitchen If this is out of the aforementioned range listed, please consider follow up with your Primary Care Provider. ? ?If you are age 14 or younger, your body mass index should be between 19-25. Your Body mass index is 30.56 kg/m?Marland Kitchen If this is out of the aformentioned range listed, please consider follow up with your Primary Care Provider.  ? ?MY CHART: ? ?The [place] [facility] providers would like to encourage you to use [facility] to communicate with providers for non-urgent requests or questions.  Due to long hold times on the telephone, sending your provider a message by [facility] may be a faster and more efficient way to get a response.  Please allow 48 business hours for a response.  Please remember that this is for non-urgent requests.  ? ? ?   ?  ? ?

## 2022-04-16 NOTE — Progress Notes (Signed)
? ? ?Assessment  ? ?Patient profile:  ?Suzanne Singh is a 56 y.o. female known to Dr. Leone Payor but not seen in years. Referred by PCP for dysphagia.  Past medical history significant for anxiety, depression, fibromyalgia, IBS, migraines See PMH below for any additional history ? ?Chest pressure / solid food dysphagia over the last month.  ?No improvement with a few weeks worth of Pepcid.  ? ?Chronic [place] anemia. Stable  ?Hgb chronically between 10-12  ? ?Plan  ? ?Schedule for EGD. The risks and benefits of EGD with possible biopsies and possible dilation were discussed with the patient who agrees to proceed. Offered an EGD for this Friday with Dr. Christella Hartigan but she needs more time to arrange. Will have the EGD done with Dr. Leone Payor later this month.  ?Until we can better understand what is going on with swallowing advised her to take swallowing precautions. Eat slowly, chew food well before swallowing. Drink  liquids in between each bite to avoid food impaction. ?Since it will be a couple of weeks before EGD is done will try Omeprazole to see if is helps with chest discomfort.  ?  ? ?History of Present Illness  ? ?Chief complaint:  swallowing problems and pressure in chest ? ? ?Malissie has been having constant pressure in chest and food is sticking in her esophagus. This started about a month ago. Saw PCP, troponin normal, D-dimer negative. H.pylori IgG negative.  LFTs normal. She has chronic anemia. Hgb was stable at 11.4. EKG - nsr.  Started her on Pepcid but patient began having fluttering in her eyes so she stopped it.  Eye fluttering didn't get better but she doesn't think Pepcid helped her symptoms anyway. She is going to see an eye doctor.  ? ?For the last month she has had constant pressure in her chest and solid food is sticking in her esophagus. The pressure is not just when she eats No SOB. No problems swallowing liquids. No odynophagia.   She has occasional reflux symptoms but not frequently enough to require  medication ? ? ?Previous Labs / Imaging:: ? ?  Latest Ref Rng & Units 03/17/2022  ?  2:56 PM 11/28/2021  ?  2:43 PM 03/25/2021  ?  7:41 AM  ?CBC  ?WBC 4.0 - 10.5 K/uL 4.8   3.5   3.1    ?Hemoglobin 12.0 - 15.0 g/dL 99.3   71.6   96.7    ?Hematocrit 36.0 - 46.0 % 34.7   35.1   32.9    ?Platelets 150.0 - 400.0 K/uL 304.0   271.0   268.0    ? ? ? ? ?  Latest Ref Rng & Units 03/17/2022  ?  2:56 PM 11/28/2021  ?  2:43 PM 03/25/2021  ?  7:41 AM  ?CMP  ?Glucose 70 - 99 mg/dL 97   75   82    ?BUN 6 - 23 mg/dL 19   20   17     ?Creatinine 0.40 - 1.20 mg/dL   8.93   8.10    ?Sodium 135 - 145 mEq/L 142   136   139    ?Potassium 3.5 - 5.1 mEq/L 3.8   4.3   4.0    ?Chloride 96 - 112 mEq/L 103   100   104    ?CO2 19 - 32 mEq/L 32   26   28    ?Calcium 8.4 - 10.5 mg/dL 9.9   9.8   8.7    ?  Total Protein 6.0 - 8.3 g/dL 7.1   7.0   6.2    ?Total Bilirubin 0.2 - 1.2 mg/dL 0.3   0.4   0.4    ?Alkaline Phos 39 - 117 U/L 98   82   79    ?AST 0 - 37 U/L 24   20   17     ?ALT 0 - 35 U/L 15   12   11     ? ? ?Previous GI Evaluations  ? ?Endoscopies: ? ?Feb 2014 Screening colonoscopy  ?Normal ? ? ?Past Medical History:  ?Diagnosis Date  ? Anxiety   ? Arrhythmia   ? Arthritis   ? Depression   ? Eating disorder   ? Fibromyalgia   ? GERD (gastroesophageal reflux disease)   ? IBS (irritable bowel syndrome)   ? Migraine   ? Seizures (HCC)   ? ?Past Surgical History:  ?Procedure Laterality Date  ? myomectomy Bilateral 04/2021  ? ?Family History  ?Problem Relation Age of Onset  ? Alcohol abuse Mother   ? Arthritis Mother   ? Lung cancer Mother   ? Hypertension Mother   ? Mental illness Mother   ? Alcohol abuse Father   ? Heart disease Father   ? Hypertension Father   ? Diabetes Father   ? Colon cancer Father   ? Early death Sister   ? Arthritis Maternal Grandmother   ? Hypertension Maternal Grandmother   ? Breast cancer Maternal Grandmother   ? Stroke Maternal Grandfather   ? Hypertension Maternal Grandfather   ? Heart disease Paternal Grandmother    ? Hypertension Paternal Grandmother   ? Colon cancer Paternal Grandfather   ? Heart disease Paternal Grandfather   ? Hypertension Paternal Grandfather   ? Kidney disease Paternal Grandfather   ? Diabetes Paternal Grandfather   ? Stomach cancer Neg Hx   ? Esophageal cancer Neg Hx   ? ?Social History  ? ?Tobacco Use  ? Smoking status: Never  ? Smokeless tobacco: Never  ?Substance Use Topics  ? Alcohol use: Yes  ?  Comment: wine on occasion  ? Drug use: No  ? ?Current Outpatient Medications  ?Medication Sig Dispense Refill  ? cyclobenzaprine (FLEXERIL) 10 MG tablet Take 1 tablet (10 mg total) by mouth 3 (three) times daily as needed for muscle spasms. 30 tablet 0  ? diazepam (VALIUM) 5 MG tablet Take 1 tablet (5 mg total) by mouth every 12 (twelve) hours as needed for anxiety. 30 tablet 1  ? famotidine (PEPCID) 20 MG tablet Take 1 tablet (20 mg total) by mouth 2 (two) times daily. 60 tablet 3  ? FLUoxetine (PROZAC) 20 MG tablet Take 1 tablet (20 mg total) by mouth daily. 90 tablet 3  ? fluticasone (FLONASE) 50 MCG/ACT nasal spray Place 2 sprays into both nostrils daily. 16 g 6  ? Olopatadine HCl (PATADAY) 0.2 % SOLN Place 1 drop into both eyes daily as needed. 2.5 mL 5  ? albuterol (PROAIR HFA) 108 (90 Base) MCG/ACT inhaler Inhale 2 puffs into the lungs every 6 (six) hours as needed for wheezing or shortness of breath. (Patient not taking: Reported on 04/16/2022) 18 g 3  ? diclofenac sodium (VOLTAREN) 1 % GEL Apply 4 g topically 4 (four) times daily. (Patient not taking: Reported on 04/16/2022) 100 g 2  ? ?Current Facility-Administered Medications  ?Medication Dose Route Frequency Provider Last Rate Last Admin  ? lidocaine (PF) (XYLOCAINE) 1 % injection 2 mL  2 mL Other Once  Tyrell Antonio, MD      ? ?Allergies  ?Allergen Reactions  ? Ambien Cr [Zolpidem Tartrate Er]   ?  Seizure ?  ? Cymbalta [Duloxetine Hcl]   ?  Seizure ?  ? Celebrex [Celecoxib]   ?  Hives ?  ? Hydrocodone Itching  ? Latex Rash  ?  Adhesive  ?  Mobic [Meloxicam] Palpitations  ? ? ? ?Review of Systems: ?Positive for fatigue.  All other systems reviewed and negative except where noted in HPI.  ? ?Physical Exam  ? ?Wt Readings from Last 3 Encounters:  ?04/16/22 213 lb (96.6 kg)  ?03/17/22 210 lb 9.6 oz (95.5 kg)  ?11/28/21 214 lb 12.8 oz (97.4 kg)  ? ? ?Ht 5\' 10"  (1.778 m)   Wt 213 lb (96.6 kg)   BMI 30.56 kg/m?  ?Constitutional:  Generally well appearing female in no acute distress. ?Psychiatric: Pleasant. Normal mood and affect. Behavior is normal. ?EENT: Pupils normal.  Conjunctivae are normal. No scleral icterus. ?Neck supple.  ?Cardiovascular: Normal rate, regular rhythm. No edema ?Pulmonary/chest: Effort normal and breath sounds normal. No wheezing, rales or rhonchi. ?Abdominal: Soft, nondistended, nontender. Bowel sounds active throughout. There are no masses palpable. No hepatomegaly. ?Neurological: Alert and oriented to person place and time. ?Skin: Skin is warm and dry. No rashes noted. ? ? , NP  04/16/2022, 11:54 AM ? ?Cc:  ?Referring Provider ?06/16/2022 R, * ? ? ? ? ? ? ? ?

## 2022-04-18 ENCOUNTER — Encounter: Payer: BC Managed Care – PPO | Admitting: Gastroenterology

## 2022-04-22 ENCOUNTER — Encounter: Payer: Self-pay | Admitting: Family Medicine

## 2022-04-29 ENCOUNTER — Encounter: Payer: BC Managed Care – PPO | Admitting: Internal Medicine

## 2022-05-27 ENCOUNTER — Ambulatory Visit (HOSPITAL_BASED_OUTPATIENT_CLINIC_OR_DEPARTMENT_OTHER)
Admission: RE | Admit: 2022-05-27 | Discharge: 2022-05-27 | Disposition: A | Discharge status: Home / Self Care | Payer: BC Managed Care – PPO | Source: Ambulatory Visit | Attending: Family Medicine | Admitting: Family Medicine

## 2022-05-27 ENCOUNTER — Ambulatory Visit (INDEPENDENT_AMBULATORY_CARE_PROVIDER_SITE_OTHER): Payer: BC Managed Care – PPO | Admitting: Family Medicine

## 2022-05-27 ENCOUNTER — Encounter: Payer: Self-pay | Admitting: Family Medicine

## 2022-05-27 VITALS — BP 111/74 | HR 68 | Ht 70.0 in | Wt 211.8 lb

## 2022-05-27 DIAGNOSIS — M79642 Pain in left hand: Secondary | ICD-10-CM

## 2022-05-27 MED ORDER — KETOROLAC TROMETHAMINE 60 MG/2ML IM SOLN
60.0000 mg | Freq: Once | INTRAMUSCULAR | Status: AC
  Administered 2022-05-27: 60 mg via INTRAMUSCULAR

## 2022-05-27 NOTE — Progress Notes (Signed)
   Acute Office Visit  Subjective:     Patient ID: Suzanne Singh, female    DOB: 04-Aug-1966, 56 y.o.   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  States that about 3 weeks ago her left hand MCP of 1st-3rd fingers started flaring up and has had some noticeable swelling with significant pain currently 6/10.  She has noticed slightly weaker grip but full range of motion.  States she cannot tolerate meloxicam or Celebrex, but she has tried some occasional Aleve which has not seemed helpful yet.  In the past she has received a Toradol injection and she would like to have one today if possible.  She is concerned about the swelling and would like to further investigate what this could be.  She denies any warmth, erythema, lesions, drainage, loss of function, radiation of symptoms.     ROS All review of systems negative except what is listed in the HPI      Objective:    BP 111/74   Pulse 68   Ht 5\' 10"  (1.778 m)   Wt 211 lb 12.8 oz (96.1 kg)   BMI 30.39 kg/m    Physical Exam Vitals reviewed.  Constitutional:      Appearance: Normal appearance.  Cardiovascular:     Rate and Rhythm: Normal rate and regular rhythm.  Pulmonary:     Effort: Pulmonary effort is normal.     Breath sounds: Normal breath sounds.  Musculoskeletal:        General: Swelling and tenderness present.     Comments: Left hand: 1st-3rd MCP with mild swelling and tenderness to palpation, normal ROM, no erythema/warmth  Skin:    General: Skin is warm and dry.     Findings: No erythema.  Neurological:     General: No focal deficit present.     Mental Status: She is alert and oriented to person, place, and time. Mental status is at baseline.  Psychiatric:        Mood and Affect: Mood normal.        Behavior: Behavior  normal.        Thought Content: Thought content normal.        Judgment: Judgment normal.    No results found for any visits on 05/27/22.      Assessment & Plan:   Problem List Items Addressed This Visit   None Visit Diagnoses     Left hand pain    -  Primary Toradol injection today for the acute pain Xray today Try taking the NSAID you tolerate (ibuprofen or Aleve) twice daily for the next 2-3 weeks (starting tomorrow) to help with inflammation and pain.  Rest, ice, stretches (it is important to stay active to preserve range of motion) If no improvement, refer to sports medicine to see if candidate for joint injections.  Previous rheumatology workup negative, we can consider repeating workup in the future if necessary.    Relevant Medications   ketorolac (TORADOL) injection 60 mg (Completed)   Other Relevant Orders   DG Hand Complete Left       Meds ordered this encounter  Medications   ketorolac (TORADOL) injection 60 mg    Return if symptoms worsen or fail to improve.  05/29/22, NP

## 2022-05-27 NOTE — Patient Instructions (Signed)
Left hand/finger pain: Toradol injection today for the acute pain Xray today Try taking the NSAID you tolerate (ibuprofen or Aleve) twice daily for the next 2-3 weeks to help with inflammation and pain.  Rest, ice, stretches (it is important to stay active to preserve range of motion) If no improvement, refer to sports medicine to see if candidate for joint injections.  Previous rheumatology workup negative, we can consider repeating workup in the future if necessary.

## 2022-06-03 ENCOUNTER — Telehealth: Payer: Self-pay | Admitting: Family Medicine

## 2022-06-03 DIAGNOSIS — M79642 Pain in left hand: Secondary | ICD-10-CM

## 2022-06-03 NOTE — Telephone Encounter (Signed)
Referral placed.

## 2022-06-09 ENCOUNTER — Encounter: Payer: Self-pay | Admitting: Internal Medicine

## 2022-06-13 NOTE — Progress Notes (Unsigned)
    [facility]ells [facility] [address] [place] [address] Phone: [phone]   Assessment and Plan:     There are no diagnoses linked to this encounter.  ***   Pertinent previous records reviewed include ***   Follow Up: ***     Subjective:   I, Trenace Coughlin, am serving as a Neurosurgeon for Doctor Richardean Sale  Chief Complaint: left hand pain   HPI:   06/16/2022 Patient is a 56 year old female complaining of left hand pain. Patient states  Relevant Historical Information: ***  Additional pertinent review of systems negative.   Current Outpatient Medications:    albuterol (PROAIR HFA) 108 (90 Base) MCG/ACT inhaler, Inhale 2 puffs into the lungs every 6 (six) hours as needed for wheezing or shortness of breath. (Patient not taking: Reported on 04/16/2022), Disp: 18 g, Rfl: 3   cyclobenzaprine (FLEXERIL) 10 MG tablet, Take 1 tablet (10 mg total) by mouth 3 (three) times daily as needed for muscle spasms., Disp: 30 tablet, Rfl: 0   diazepam (VALIUM) 5 MG tablet, Take 1 tablet (5 mg total) by mouth every 12 (twelve) hours as needed for anxiety., Disp: 30 tablet, Rfl: 1   diclofenac sodium (VOLTAREN) 1 % GEL, Apply 4 g topically 4 (four) times daily. (Patient not taking: Reported on 04/16/2022), Disp: 100 g, Rfl: 2   famotidine (PEPCID) 20 MG tablet, Take 1 tablet (20 mg total) by mouth 2 (two) times daily., Disp: 60 tablet, Rfl: 3   FLUoxetine (PROZAC) 20 MG tablet, Take 1 tablet (20 mg total) by mouth daily., Disp: 90 tablet, Rfl: 3   fluticasone (FLONASE) 50 MCG/ACT nasal spray, Place 2 sprays into both nostrils daily., Disp: 16 g, Rfl: 6   Olopatadine HCl (PATADAY) 0.2 % SOLN, Place 1 drop into both eyes daily as needed., Disp: 2.5 mL, Rfl: 5   omeprazole (PRILOSEC) 40 MG capsule, Take 1 capsule (40 mg total) by mouth daily., Disp: 30 capsule, Rfl: 3  Current Facility-Administered Medications:    lidocaine (PF) (XYLOCAINE) 1 %  injection 2 mL, 2 mL, Other, Once, Tyrell Antonio, MD   Objective:     There were no vitals filed for this visit.    There is no height or weight on file to calculate BMI.    Physical Exam:    ***   Electronically signed by:  Aleen [facility]ells [facility] 3:49 PM 06/13/22

## 2022-06-16 ENCOUNTER — Ambulatory Visit (INDEPENDENT_AMBULATORY_CARE_PROVIDER_SITE_OTHER): Payer: BC Managed Care – PPO | Admitting: Sports Medicine

## 2022-06-16 ENCOUNTER — Ambulatory Visit: Payer: Self-pay

## 2022-06-16 VITALS — BP 132/79 | HR 67 | Ht 70.0 in | Wt 215.0 lb

## 2022-06-16 DIAGNOSIS — M79642 Pain in left hand: Secondary | ICD-10-CM | POA: Diagnosis not present

## 2022-06-16 DIAGNOSIS — Q600 Renal agenesis, unilateral: Secondary | ICD-10-CM

## 2022-06-16 MED ORDER — NAPROXEN 500 MG PO TABS: 500.0000 mg | ORAL_TABLET | Freq: Two times a day (BID) | ORAL | 0 refills | Status: DC

## 2022-06-16 NOTE — Patient Instructions (Addendum)
Good to see you  Naproxen 500 mg  2X daily for two weeks  2 week follow up

## 2022-06-18 ENCOUNTER — Ambulatory Visit (AMBULATORY_SURGERY_CENTER): Payer: Self-pay | Admitting: *Deleted

## 2022-06-18 VITALS — Ht 70.0 in | Wt 214.0 lb

## 2022-06-18 DIAGNOSIS — R1310 Dysphagia, unspecified: Secondary | ICD-10-CM

## 2022-06-18 DIAGNOSIS — K219 Gastro-esophageal reflux disease without esophagitis: Secondary | ICD-10-CM

## 2022-06-18 NOTE — Progress Notes (Signed)
Patient is here in-person for PV. Patient denies any allergies to eggs or soy. Patient denies any problems with anesthesia/sedation. Patient is not on any oxygen at home. Patient is not taking any diet/weight loss medications or blood thinners. Went over procedure prep instructions with the patient. Patient is aware of our care-partner policy. Pt denies changes in medical hx since last GI OV. Pt denies any loose teeth.  EMMI education assigned to the patient for the procedure, sent to MyChart.

## 2022-06-27 NOTE — Progress Notes (Unsigned)
    [facility] [address] [place] [address] Phone: [phone]   Assessment and Plan:     There are no diagnoses linked to this encounter.  ***   Pertinent previous records reviewed include ***   Follow Up: ***     Subjective:   I, Jeanean Hollett, am serving as a Neurosurgeon for Doctor Richardean Sale   Chief Complaint: left hand pain    HPI:    06/16/2022 Patient is a 56 year old female complaining of left hand pain. Patient states that her hand is swollen and there is a knot in between 2-3 digits,been going on for a while the swelling with in the last month and a half , no numbness or tingling, has been doing yard work pulling weeds and things like that doesn't remember a specific incident does have arthritis has tried meloxicam and that didn't work and made her sick    06/30/2022 Patient states   Relevant Historical Information: Congenital single kidney  Additional pertinent review of systems negative.   Current Outpatient Medications:    albuterol (PROAIR HFA) 108 (90 Base) MCG/ACT inhaler, Inhale 2 puffs into the lungs every 6 (six) hours as needed for wheezing or shortness of breath., Disp: 18 g, Rfl: 3   cyclobenzaprine (FLEXERIL) 10 MG tablet, Take 1 tablet (10 mg total) by mouth 3 (three) times daily as needed for muscle spasms., Disp: 30 tablet, Rfl: 0   diazepam (VALIUM) 5 MG tablet, Take 1 tablet (5 mg total) by mouth every 12 (twelve) hours as needed for anxiety., Disp: 30 tablet, Rfl: 1   diclofenac sodium (VOLTAREN) 1 % GEL, Apply 4 g topically 4 (four) times daily., Disp: 100 g, Rfl: 2   famotidine (PEPCID) 20 MG tablet, Take 1 tablet (20 mg total) by mouth 2 (two) times daily., Disp: 60 tablet, Rfl: 3   FLUoxetine (PROZAC) 20 MG tablet, Take 1 tablet (20 mg total) by mouth daily., Disp: 90 tablet, Rfl: 3   fluticasone (FLONASE) 50 MCG/ACT nasal spray, Place 2 sprays into both nostrils daily., Disp: 16 g,  Rfl: 6   naproxen (NAPROSYN) 500 MG tablet, Take 1 tablet (500 mg total) by mouth 2 (two) times daily with a meal., Disp: 28 tablet, Rfl: 0   Olopatadine HCl (PATADAY) 0.2 % SOLN, Place 1 drop into both eyes daily as needed., Disp: 2.5 mL, Rfl: 5   omeprazole (PRILOSEC) 40 MG capsule, Take 1 capsule (40 mg total) by mouth daily., Disp: 30 capsule, Rfl: 3  Current Facility-Administered Medications:    lidocaine (PF) (XYLOCAINE) 1 % injection 2 mL, 2 mL, Other, Once, Tyrell Antonio, MD   Objective:     There were no vitals filed for this visit.    There is no height or weight on file to calculate BMI.    Physical Exam:    ***   Electronically signed by:  [facility]leen [facility] 8:31 AM 06/27/22

## 2022-06-30 ENCOUNTER — Ambulatory Visit (INDEPENDENT_AMBULATORY_CARE_PROVIDER_SITE_OTHER): Payer: BC Managed Care – PPO | Admitting: Sports Medicine

## 2022-06-30 ENCOUNTER — Ambulatory Visit: Payer: Self-pay

## 2022-06-30 VITALS — BP 132/82 | HR 64 | Ht 70.0 in | Wt 215.0 lb

## 2022-06-30 DIAGNOSIS — M79642 Pain in left hand: Secondary | ICD-10-CM

## 2022-06-30 NOTE — Patient Instructions (Addendum)
Good to see you Discontinue daily naproxen can use remainder as needed 3 week follow up

## 2022-07-04 ENCOUNTER — Encounter: Payer: Self-pay | Admitting: Internal Medicine

## 2022-07-09 ENCOUNTER — Encounter: Payer: Self-pay | Admitting: Internal Medicine

## 2022-07-09 ENCOUNTER — Ambulatory Visit (AMBULATORY_SURGERY_CENTER): Payer: BC Managed Care – PPO | Admitting: Internal Medicine

## 2022-07-09 ENCOUNTER — Other Ambulatory Visit: Payer: Self-pay | Admitting: Internal Medicine

## 2022-07-09 VITALS — BP 115/76 | HR 56 | Temp 97.7°F | Resp 11 | Ht 70.0 in | Wt 214.0 lb

## 2022-07-09 DIAGNOSIS — K3189 Other diseases of stomach and duodenum: Secondary | ICD-10-CM | POA: Diagnosis not present

## 2022-07-09 DIAGNOSIS — K319 Disease of stomach and duodenum, unspecified: Secondary | ICD-10-CM | POA: Diagnosis not present

## 2022-07-09 DIAGNOSIS — R1310 Dysphagia, unspecified: Secondary | ICD-10-CM

## 2022-07-09 MED ORDER — SODIUM CHLORIDE 0.9 % IV SOLN: 500.0000 mL | Freq: Once | INTRAVENOUS | Status: DC

## 2022-07-09 NOTE — Patient Instructions (Addendum)
There was some red color change in part of the stomach - often not an issue but I took biopsies. 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 Very small hiatal hernia seen also - probably not a significant issue.  I did go ahead and dilate the esophagus though no narrowing or strictures seen this often helps.  I will let you know biopsy results and see how you are doing - likely will hear next 1-2 weeks.  I appreciate the opportunity to care for you. Iva Boop, MD, FACG  YOU HAD AN ENDOSCOPIC PROCEDURE TODAY AT THE [facility]:   Refer to the procedure report that was given to you for any specific questions about what was found during the examination.  If the procedure report does not answer your questions, please call your gastroenterologist to clarify.  If you requested that your care partner not be given the details of your procedure findings, then the procedure report has been included in a sealed envelope for you to review at your convenience later.  YOU SHOULD EXPECT: Some feelings of bloating in the abdomen. Passage of more gas than usual.  Walking can help get rid of the air that was put into your GI tract during the procedure and reduce the bloating. If you had a lower endoscopy (such as a colonoscopy or flexible sigmoidoscopy) you may notice spotting of blood in your stool or on the toilet paper. If you underwent a bowel prep for your procedure, you may not have a normal bowel movement for a few days.  Please Note:  You might notice some irritation and congestion in your nose or some drainage.  This is from the oxygen used during your procedure.  There is no need for concern and it should clear up in a day or so.  SYMPTOMS TO REPORT IMMEDIATELY:  Following lower endoscopy (colonoscopy or flexible sigmoidoscopy):  Excessive amounts of blood in the stool  Significant tenderness or worsening of  abdominal pains  Swelling of the abdomen that is new, acute  Fever of 100F or higher  Following upper endoscopy (EGD)  Vomiting of blood or coffee ground material  New chest pain or pain under the shoulder blades  Painful or persistently difficult swallowing  New shortness of breath  Fever of 100F or higher  Black, tarry-looking stools  For urgent or emergent issues, a gastroenterologist can be reached at any hour by calling [phone]. Do not use MyChart messaging for urgent concerns.    DIET:  We do recommend a small meal at first, but then you may proceed to your regular diet.  Drink plenty of fluids but you should avoid alcoholic beverages for 24 hours.  ACTIVITY:  You should plan to take it easy for the rest of today and you should NOT DRIVE or use heavy machinery until tomorrow (because of the sedation medicines used during the test).    FOLLOW UP: Our staff will call the number listed on your records the next business day following your procedure.  We will call around 7:15- 8:00 am to check on you and address any questions or concerns that you may have regarding the information given to you following your procedure. If we do not reach you, we will leave a message.  If you develop any symptoms (ie: fever, flu-like symptoms, shortness of breath, cough etc.) before then, please call [phone].  If you test  positive for Covid 19 in the 2 weeks post procedure, please call and report this information to [place].    If any biopsies were taken you will be contacted by phone or by letter within the next 1-3 weeks.  Please call us at [phone] if you have not heard about the biopsies in 3 weeks.    SIGNATURES/CONFIDENTIALITY: You and/or your care partner have signed paperwork which will be entered into your electronic medical record.  These signatures attest to the fact that that the information above on your After Visit Summary has been reviewed and is understood.  Full  responsibility of the confidentiality of this discharge information lies with you and/or your care-partner.

## 2022-07-09 NOTE — Op Note (Signed)
[facility] Patient Name: Suzanne Singh Procedure Date: 07/09/2022 9:46 AM MRN: [id number] Endoscopist: Iva Boop , MD Age: 56 Referring MD:  Date of Birth: 1966-11-09 Gender: Female Account #: [id number] Procedure:                Upper GI endoscopy Indications:              Dysphagia Medicines:                Monitored Anesthesia Care Procedure:                Pre-Anesthesia Assessment:                           - Prior to the procedure, a History and Physical                            was performed, and patient medications and                            allergies were reviewed. The patient's tolerance of                            previous anesthesia was also reviewed. The risks                            and benefits of the procedure and the sedation                            options and risks were discussed with the patient.                            All questions were answered, and informed consent                            was obtained. Prior Anticoagulants: The patient has                            taken no previous anticoagulant or antiplatelet                            agents. ASA Grade Assessment: II - A patient with                            mild systemic disease. After reviewing the risks                            and benefits, the patient was deemed in                            satisfactory condition to undergo the procedure.                           After obtaining informed consent, the endoscope was  passed under direct vision. Throughout the                            procedure, the patient's blood pressure, pulse, and                            oxygen saturations were monitored continuously. The                            Endoscope was introduced through the mouth, and                            advanced to the second part of duodenum. The upper                            GI endoscopy was accomplished without  difficulty.                            The patient tolerated the procedure well. Scope In: Scope Out: Findings:                 Localized mucosal changes characterized by columnar                            mucosa were found in the upper third of the                            esophagus. Biopsies were taken with a cold forceps                            for histology. Verification of patient                            identification for the specimen was done. Estimated                            blood loss was minimal.                           The gastroesophageal flap valve was visualized                            endoscopically and classified as Hill Grade III                            (minimal fold, loose to endoscope, hiatal hernia                            likely).                           Striped moderately erythematous mucosa without                            bleeding was found in the cardia, in the gastric  fundus and in the gastric body. Biopsies were taken                            with a cold forceps for histology. Verification of                            patient identification for the specimen was done.                            Estimated blood loss was minimal.                           The exam was otherwise without abnormality.                           The cardia and gastric fundus were normal on                            retroflexion.                           The scope was withdrawn. Dilation was performed in                            the entire esophagus with a Maloney dilator with                            mild resistance at 54 Fr. Relook - no mucosal                            disruption - heme at proximal biopsy sites streaked                            dilator upon withdrawal. Complications:            No immediate complications. Estimated Blood Loss:     Estimated blood loss was minimal. Estimated blood                             loss was minimal. Impression:               - Columnar mucosa mucosa in the esophagus. Biopsied.                           - Gastroesophageal flap valve classified as Hill                            Grade III (minimal fold, loose to endoscope, hiatal                            hernia likely).? sliding about 2 cm                           - Erythematous mucosa in the cardia, gastric fundus  and gastric body. Biopsied.                           - The examination was otherwise normal.                           - Dilation performed in the entire esophagus. 54 Fr Recommendation:           - Patient has a contact number available for                            emergencies. The signs and symptoms of potential                            delayed complications were discussed with the                            patient. Return to normal activities tomorrow.                            Written discharge instructions were provided to the                            patient.                           - Clear liquids x 1 hour then soft foods rest of                            day. Start prior diet tomorrow.                           - Continue present medications. She thinks                            omeprazole has helped some.                           - Await pathology results. Will call results and                            reassess symptoms of dysphagia. If persistent                            issues barium swallow next. Iva Boop, MD 07/09/2022 10:13:01 AM This report has been signed electronically.

## 2022-07-09 NOTE — Progress Notes (Signed)
Pt's states no medical or surgical changes since previsit or office visit. 

## 2022-07-09 NOTE — Progress Notes (Signed)
Called to room to assist during endoscopic procedure.  Patient ID and intended procedure confirmed with present staff. Received instructions for my participation in the procedure from the performing physician.  

## 2022-07-09 NOTE — Progress Notes (Signed)
[facility] History and Physical   Primary Care Physician:  Zola Button, Grayling Congress, DO   Reason for Procedure:   dysphagia  Plan:    EGD, possible esophageal dilation     HPI: Lorinda Copland is a 56 y.o. female here to evaluate dysphagia - saw Willette Cluster NP 04/16/22 and procedure arranged. Was started on omeprazole.  NL EGD 2104 (epigastric pain)  Past Medical History:  Diagnosis Date   Anxiety    Arrhythmia    Arthritis    Depression    Eating disorder    Fibromyalgia    GERD (gastroesophageal reflux disease)    IBS (irritable bowel syndrome)    Migraine    Seizures (HCC)    last 2008    Past Surgical History:  Procedure Laterality Date   COLONOSCOPY Bilateral 2014   myomectomy Bilateral 04/2021    Prior to Admission medications   Medication Sig Start Date End Date Taking? Authorizing Provider  famotidine (PEPCID) 20 MG tablet Take 1 tablet (20 mg total) by mouth 2 (two) times daily. 03/17/22  Yes Seabron Spates R, DO  FLUoxetine (PROZAC) 20 MG tablet Take 1 tablet (20 mg total) by mouth daily. 10/03/21  Yes Seabron Spates R, DO  albuterol (PROAIR HFA) 108 (90 Base) MCG/ACT inhaler Inhale 2 puffs into the lungs every 6 (six) hours as needed for wheezing or shortness of breath. 09/21/17   Saguier, Ramon Dredge, PA-C  cyclobenzaprine (FLEXERIL) 10 MG tablet Take 1 tablet (10 mg total) by mouth 3 (three) times daily as needed for muscle spasms. 10/03/21   Donato Schultz, DO  diazepam (VALIUM) 5 MG tablet Take 1 tablet (5 mg total) by mouth every 12 (twelve) hours as needed for anxiety. 03/21/21   Seabron Spates R, DO  diclofenac sodium (VOLTAREN) 1 % GEL Apply 4 g topically 4 (four) times daily. 02/10/19   Seabron Spates R, DO  fluticasone (FLONASE) 50 MCG/ACT nasal spray Place 2 sprays into both nostrils daily. 04/24/17   Donato Schultz, DO  ibuprofen (ADVIL) 800 MG tablet Take 800 mg by mouth 3 (three) times daily as needed. 07/01/22    [provider]  naproxen (NAPROSYN) 500 MG tablet Take 1 tablet (500 mg total) by mouth 2 (two) times daily with a meal. 06/16/22   Richardean Sale, DO  Olopatadine HCl (PATADAY) 0.2 % SOLN Place 1 drop into both eyes daily as needed. 08/10/20   Marcelyn Bruins, MD  omeprazole (PRILOSEC) 40 MG capsule Take 1 capsule (40 mg total) by mouth daily. 04/16/22   Meredith Pel, NP    Current Outpatient Medications  Medication Sig Dispense Refill   famotidine (PEPCID) 20 MG tablet Take 1 tablet (20 mg total) by mouth 2 (two) times daily. 60 tablet 3   FLUoxetine (PROZAC) 20 MG tablet Take 1 tablet (20 mg total) by mouth daily. 90 tablet 3   albuterol (PROAIR HFA) 108 (90 Base) MCG/ACT inhaler Inhale 2 puffs into the lungs every 6 (six) hours as needed for wheezing or shortness of breath. 18 g 3   cyclobenzaprine (FLEXERIL) 10 MG tablet Take 1 tablet (10 mg total) by mouth 3 (three) times daily as needed for muscle spasms. 30 tablet 0   diazepam (VALIUM) 5 MG tablet Take 1 tablet (5 mg total) by mouth every 12 (twelve) hours as needed for anxiety. 30 tablet 1   diclofenac sodium (VOLTAREN) 1 % GEL Apply 4 g topically 4 (four) times  daily. 100 g 2   fluticasone (FLONASE) 50 MCG/ACT nasal spray Place 2 sprays into both nostrils daily. 16 g 6   ibuprofen (ADVIL) 800 MG tablet Take 800 mg by mouth 3 (three) times daily as needed.     naproxen (NAPROSYN) 500 MG tablet Take 1 tablet (500 mg total) by mouth 2 (two) times daily with a meal. 28 tablet 0   Olopatadine HCl (PATADAY) 0.2 % SOLN Place 1 drop into both eyes daily as needed. 2.5 mL 5   omeprazole (PRILOSEC) 40 MG capsule Take 1 capsule (40 mg total) by mouth daily. 30 capsule 3   Current Facility-Administered Medications  Medication Dose Route Frequency Provider Last Rate Last Admin   0.9 %  sodium chloride infusion  500 mL Intravenous Once Gatha Mayer, MD       lidocaine (PF) (XYLOCAINE) 1 % injection 2 mL  2 mL Other Once  Magnus Sinning, MD        Allergies as of 07/09/2022 - Review Complete 07/09/2022  Allergen Reaction Noted   Ambien cr [zolpidem tartrate er]  07/22/2012   Covid-19 mrna vacc (moderna) Swelling 06/18/2022   Cymbalta [duloxetine hcl]  07/22/2012   Celebrex [celecoxib]  07/22/2012   Hydrocodone Itching 12/06/2015   Latex Rash 12/06/2015   Mobic [meloxicam] Palpitations 11/28/2021    Family History  Problem Relation Age of Onset   Alcohol abuse Mother    Arthritis Mother    Lung cancer Mother    Hypertension Mother    Mental illness Mother    Alcohol abuse Father    Heart disease Father    Hypertension Father    Diabetes Father    Colon cancer Father    Early death Sister    Arthritis Maternal Grandmother    Hypertension Maternal Grandmother    Breast cancer Maternal Grandmother    Stroke Maternal Grandfather    Hypertension Maternal Grandfather    Heart disease Paternal Grandmother    Hypertension Paternal Grandmother    Colon cancer Paternal Grandfather    Heart disease Paternal Grandfather    Hypertension Paternal Grandfather    Kidney disease Paternal Grandfather    Diabetes Paternal Grandfather    Stomach cancer Neg Hx    Esophageal cancer Neg Hx     Social History   Socioeconomic History   Marital status: Married    Spouse name: Not on file   Number of children: 2   Years of education: Not on file   Highest education level: Not on file  Occupational History   Occupation: Pharmacist, hospital  Tobacco Use   Smoking status: Never   Smokeless tobacco: Never  Vaping Use   Vaping Use: Never used  Substance and Sexual Activity   Alcohol use: Not Currently    Comment: wine on occasion   Drug use: No   Sexual activity: Not Currently  Other Topics Concern   Not on file  Social History Narrative   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   2 sons      Exercise-no   Social  Determinants of Radio broadcast assistant Strain: Not on file  Food Insecurity: Not on file  Transportation Needs: Not on file  Physical Activity: Not on file  Stress: Not on file  Social Connections: Not on file  Intimate Partner Violence: Not on file    Review of Systems:  All other review of systems negative except as  mentioned in the HPI.  Physical Exam: Vital signs BP 104/69   Pulse 70   Temp 97.7 F (36.5 C)   Ht 5\' 10"  (1.778 m)   Wt 214 lb (97.1 kg)   SpO2 99%   BMI 30.71 kg/m   General:   Alert,  Well-developed, well-nourished, pleasant and cooperative in NAD Lungs:  Clear throughout to auscultation.   Heart:  Regular rate and rhythm; no murmurs, clicks, rubs,  or gallops. Abdomen:  Soft, nontender and nondistended. Normal bowel sounds.   Neuro/Psych:  Alert and cooperative. Normal mood and affect. A and O x 3   @Jaslin Novitski  , MD, [facility] [phone] (pager) 07/09/2022 9:45 AM@

## 2022-07-09 NOTE — Progress Notes (Signed)
Pt in recovery with monitors in place, VSS. Report given to receiving RN. Bite guard was placed with pt awake to ensure comfort. No dental or soft tissue damage noted. 

## 2022-07-10 ENCOUNTER — Telehealth: Payer: Self-pay | Admitting: *Deleted

## 2022-07-10 NOTE — Telephone Encounter (Signed)
  Follow up Call-     07/09/2022    9:02 AM  Call back number  Post procedure Call Back phone  # [phone]  Permission to leave phone message Yes     Patient questions:  Do you have a fever, pain , or abdominal swelling? No. Pain Score  0 *  Have you tolerated food without any problems? Yes.    Have you been able to return to your normal activities? Yes.    Do you have any questions about your discharge instructions: Diet   No. Medications  No. Follow up visit  No.  Do you have questions or concerns about your Care? No.  Actions: * If pain score is 4 or above: No action needed, pain <4.

## 2022-07-14 ENCOUNTER — Telehealth: Payer: Self-pay | Admitting: Nurse Practitioner

## 2022-07-14 ENCOUNTER — Other Ambulatory Visit: Payer: Self-pay

## 2022-07-14 NOTE — Telephone Encounter (Signed)
Biopsies did not show any problems.  Please ask her how her swallowing is doing? Any dysphagia??   Then I can make a recommendation

## 2022-07-14 NOTE — Telephone Encounter (Signed)
PT is calling to get pathology report from colonoscopy done 07/09/2022. Please reach out to advise. Thank you.

## 2022-07-14 NOTE — Telephone Encounter (Signed)
Left message for pt to call back  °

## 2022-07-14 NOTE — Telephone Encounter (Signed)
PT needs refill for Omeprazole. Please call back to advise.

## 2022-07-14 NOTE — Telephone Encounter (Signed)
Pt stated she had one more refill left of omeprazole but pt wanted to know the results of EGD before she got more omeprazole. Please advise.

## 2022-07-15 NOTE — Progress Notes (Deleted)
    Suzanne Singh D.[facility] [address] [place] [address] Phone: [phone]   Assessment and Plan:     There are no diagnoses linked to this encounter.  ***   Pertinent previous records reviewed include ***   Follow Up: ***     Subjective:   I, Suzanne Singh, am serving as a Neurosurgeon for Doctor Richardean Sale   Chief Complaint: left hand pain    HPI:    06/16/2022 Patient is a 56 year old female complaining of left hand pain. Patient states that her hand is swollen and there is a knot in between 2-3 digits,been going on for a while the swelling with in the last month and a half , no numbness or tingling, has been doing yard work pulling weeds and things like that doesn't remember a specific incident does have arthritis has tried meloxicam and that didn't work and made her sick    06/30/2022 Patient states that she still has pain in her hand the swelling is still there but has gone down   07/23/2022 Patient states     Relevant Historical Information: Congenital single kidney  Additional pertinent review of systems negative.   Current Outpatient Medications:    albuterol (PROAIR HFA) 108 (90 Base) MCG/ACT inhaler, Inhale 2 puffs into the lungs every 6 (six) hours as needed for wheezing or shortness of breath., Disp: 18 g, Rfl: 3   cyclobenzaprine (FLEXERIL) 10 MG tablet, Take 1 tablet (10 mg total) by mouth 3 (three) times daily as needed for muscle spasms., Disp: 30 tablet, Rfl: 0   diazepam (VALIUM) 5 MG tablet, Take 1 tablet (5 mg total) by mouth every 12 (twelve) hours as needed for anxiety., Disp: 30 tablet, Rfl: 1   diclofenac sodium (VOLTAREN) 1 % GEL, Apply 4 g topically 4 (four) times daily., Disp: 100 g, Rfl: 2   famotidine (PEPCID) 20 MG tablet, Take 1 tablet (20 mg total) by mouth 2 (two) times daily., Disp: 60 tablet, Rfl: 3   FLUoxetine (PROZAC) 20 MG tablet, Take 1 tablet (20 mg total) by mouth daily., Disp: 90  tablet, Rfl: 3   fluticasone (FLONASE) 50 MCG/ACT nasal spray, Place 2 sprays into both nostrils daily., Disp: 16 g, Rfl: 6   ibuprofen (ADVIL) 800 MG tablet, Take 800 mg by mouth 3 (three) times daily as needed., Disp: , Rfl:    naproxen (NAPROSYN) 500 MG tablet, Take 1 tablet (500 mg total) by mouth 2 (two) times daily with a meal., Disp: 28 tablet, Rfl: 0   Olopatadine HCl (PATADAY) 0.2 % SOLN, Place 1 drop into both eyes daily as needed., Disp: 2.5 mL, Rfl: 5   omeprazole (PRILOSEC) 40 MG capsule, Take 1 capsule (40 mg total) by mouth daily., Disp: 30 capsule, Rfl: 3  Current Facility-Administered Medications:    lidocaine (PF) (XYLOCAINE) 1 % injection 2 mL, 2 mL, Other, Once, Tyrell Antonio, MD   Objective:     There were no vitals filed for this visit.    There is no height or weight on file to calculate BMI.    Physical Exam:    ***   Electronically signed by:  Suzanne [facility] D.[facility] 8:59 AM 07/15/22

## 2022-07-16 NOTE — Telephone Encounter (Addendum)
Spoke with pt and let pt know about biopsy results. Pt stated she is not having any difficulty swallowing but she does still have a fullness feeling in her chest.

## 2022-07-17 NOTE — Telephone Encounter (Signed)
She can stop omeprazole and monitor things - call back prn

## 2022-07-17 NOTE — Telephone Encounter (Signed)
Spoke with pt and gave pt Dr. Marvell Fuller message. Pt verbalized understanding and had no other concerns at end of call.

## 2022-07-23 ENCOUNTER — Ambulatory Visit: Payer: BC Managed Care – PPO | Admitting: Sports Medicine

## 2022-11-28 ENCOUNTER — Encounter: Payer: Self-pay | Admitting: Family Medicine

## 2022-11-28 ENCOUNTER — Ambulatory Visit (INDEPENDENT_AMBULATORY_CARE_PROVIDER_SITE_OTHER): Payer: BC Managed Care – PPO | Admitting: Family Medicine

## 2022-11-28 VITALS — BP 110/90 | HR 77 | Temp 99.0°F | Resp 18 | Ht 70.0 in | Wt 208.4 lb

## 2022-11-28 DIAGNOSIS — S134XXA Sprain of ligaments of cervical spine, initial encounter: Secondary | ICD-10-CM

## 2022-11-28 DIAGNOSIS — M5442 Lumbago with sciatica, left side: Secondary | ICD-10-CM

## 2022-11-28 MED ORDER — CYCLOBENZAPRINE HCL 10 MG PO TABS: 10.0000 mg | ORAL_TABLET | Freq: Three times a day (TID) | ORAL | 0 refills | Status: AC | PRN

## 2022-11-28 MED ORDER — PREDNISONE 10 MG PO TABS: ORAL_TABLET | ORAL | 0 refills | Status: DC

## 2022-11-28 MED ORDER — KETOROLAC TROMETHAMINE 60 MG/2ML IM SOLN
60.0000 mg | Freq: Once | INTRAMUSCULAR | Status: AC
  Administered 2022-11-28: 60 mg via INTRAMUSCULAR

## 2022-11-28 NOTE — Progress Notes (Signed)
Established Patient Office Visit  Subjective   Patient ID: Suzanne Singh, female    DOB: 1966-10-29  Age: 56 y.o. MRN: [id number]  Chief Complaint  Patient presents with   Back Pain    Sxs started Tuesday. No falls or injuries. Pt states working in the yard and states having spasms and shooting pain down the legs    HPI Pt here c/o low back pain on L side radiates down L thigh   She was raking leaves sat and woke up Sunday with a lot of pain and spasms    Review of Systems  Constitutional:  Negative for chills, fever and malaise/fatigue.  HENT:  Negative for congestion and hearing loss.   Eyes:  Negative for discharge.  Respiratory:  Negative for cough, sputum production and shortness of breath.   Cardiovascular:  Negative for chest pain, palpitations and leg swelling.  Gastrointestinal:  Negative for abdominal pain, blood in stool, constipation, diarrhea, heartburn, nausea and vomiting.  Genitourinary:  Negative for dysuria, frequency, hematuria and urgency.  Musculoskeletal:  Positive for back pain. Negative for falls and myalgias.  Skin:  Negative for rash.  Neurological:  Negative for dizziness, sensory change, loss of consciousness, weakness and headaches.  Endo/Heme/Allergies:  Negative for environmental allergies. Does not bruise/bleed easily.  Psychiatric/Behavioral:  Negative for depression and suicidal ideas. The patient is not nervous/anxious and does not have insomnia.       Objective:     BP (!) 110/90 (BP Location: Left Arm, Patient Position: Sitting, Cuff Size: Normal)   Pulse 77   Temp 99 F (37.2 C) (Oral)   Resp 18   Ht 5\' 10"  (1.778 m)   Wt 208 lb 6.4 oz (94.5 kg)   SpO2 100%   BMI 29.90 kg/m    Physical Exam Vitals and nursing note reviewed.  Constitutional:      Appearance: She is well-developed.  HENT:     Head: Normocephalic and atraumatic.  Eyes:     Conjunctiva/sclera: Conjunctivae normal.  Neck:     Thyroid: No thyromegaly.      Vascular: No carotid bruit or JVD.  Cardiovascular:     Rate and Rhythm: Normal rate and regular rhythm.     Heart sounds: Normal heart sounds. No murmur heard. Pulmonary:     Effort: Pulmonary effort is normal. No respiratory distress.     Breath sounds: Normal breath sounds. No wheezing or rales.  Chest:     Chest wall: No tenderness.  Musculoskeletal:        General: Tenderness present.     Cervical back: Normal range of motion and neck supple.     Lumbar back: Swelling, spasms and tenderness present. No bony tenderness. Decreased range of motion. Positive left straight leg raise test. Negative right straight leg raise test.  Neurological:     General: No focal deficit present.     Mental Status: She is alert and oriented to person, place, and time.     Motor: No weakness.     Gait: Gait normal.      No results found for any visits on 11/28/22.    The 10-year ASCVD risk score (Arnett DK, et al., 2019) is: 1.6%    Assessment & Plan:   Problem List Items Addressed This Visit       Unprioritized   Whiplash injury to neck   Relevant Medications   cyclobenzaprine (FLEXERIL) 10 MG tablet   Acute left-sided low back pain with left-sided sciatica -  Primary    Muscle relaxer  Pred taper  Tylenol -- toradol injection  Ice/ heat F/u chiropractor / sport med  Return to office as needed       Relevant Medications   cyclobenzaprine (FLEXERIL) 10 MG tablet   predniSONE (DELTASONE) 10 MG tablet    No follow-ups on file.    Donato Schultz, DO

## 2022-11-28 NOTE — Assessment & Plan Note (Signed)
Muscle relaxer  Pred taper  Tylenol -- toradol injection  Ice/ heat F/u chiropractor / sport med  Return to office as needed

## 2022-12-19 ENCOUNTER — Ambulatory Visit (INDEPENDENT_AMBULATORY_CARE_PROVIDER_SITE_OTHER): Payer: BC Managed Care – PPO | Admitting: Family

## 2022-12-19 ENCOUNTER — Other Ambulatory Visit (HOSPITAL_COMMUNITY)
Admission: RE | Admit: 2022-12-19 | Discharge: 2022-12-19 | Disposition: A | Discharge status: Home / Self Care | Payer: BC Managed Care – PPO | Source: Ambulatory Visit | Attending: Family | Admitting: Family

## 2022-12-19 ENCOUNTER — Encounter: Payer: Self-pay | Admitting: Family

## 2022-12-19 VITALS — BP 128/72 | HR 80 | Ht 70.0 in | Wt 209.0 lb

## 2022-12-19 DIAGNOSIS — R300 Dysuria: Secondary | ICD-10-CM | POA: Insufficient documentation

## 2022-12-19 DIAGNOSIS — R3915 Urgency of urination: Secondary | ICD-10-CM | POA: Insufficient documentation

## 2022-12-19 LAB — POC URINALSYSI DIPSTICK (AUTOMATED)
Bilirubin, UA: NEGATIVE
Blood, UA: NEGATIVE
Glucose, UA: NEGATIVE
Ketones, UA: NEGATIVE
Nitrite, UA: NEGATIVE
Protein, UA: NEGATIVE
Spec Grav, UA: 1.01 (ref 1.010–1.025)
Urobilinogen, UA: 0.2 E.U./dL
pH, UA: 7.5 (ref 5.0–8.0)

## 2022-12-19 NOTE — Progress Notes (Signed)
Suzanne Singh is a 57 y.o. female with the following history as recorded in [employer]:  Patient Active Problem List   Diagnosis Date Noted   Acute left-sided low back pain with left-sided sciatica 11/28/2022   Congenital single kidney 06/16/2022   Chest pressure 03/17/2022   Dysphagia 03/17/2022   Whiplash injury to neck 10/03/2021   DDD (degenerative disc disease), cervical 03/21/2021   Palpitations 10/04/2020   Tachycardia 06/14/2020   Shortness of breath on exertion 06/14/2020   Memory loss 06/14/2020   Brain fog 06/14/2020   Benign paroxysmal positional vertigo due to bilateral vestibular disorder 06/14/2020   Tinnitus of both ears 06/14/2020   Physical deconditioning 06/14/2020   Essential hypertension 05/16/2020   Stress headache 05/16/2020   SOB (shortness of breath) 05/16/2020   Immunization reaction 05/16/2020   Neck pain on right side 03/31/2018   Viral URI with cough 04/25/2017   Abscess 12/27/2016   Left anterior shoulder pain 06/28/2015   Skin infection 01/12/2015   Breast pain 01/12/2015   UTI (urinary tract infection) 11/16/2014   Weight gain 11/16/2014   Bilateral knee pain 09/21/2014   Thyromegaly 09/21/2014   Obesity (BMI 30-39.9) 06/21/2014   Edema 04/07/2014   Maxillary sinusitis 11/07/2013   Elevated BP 11/07/2013   Insomnia 07/28/2013   Epigastric pain 12/06/2012   Hot flushes, perimenopausal 12/06/2012   IBS (irritable bowel syndrome) 12/06/2012   Fatigue 12/06/2012   Family hx of colon cancer in grandparent 12/06/2012   Screening for malignant neoplasm of the cervix 07/22/2012   Preventative health care 07/22/2012   Anxiety and depression 07/22/2012   Eating disorder 07/22/2012   GERD (gastroesophageal reflux disease) 07/22/2012   Fibromyalgia 07/22/2012    Current Outpatient Medications  Medication Sig Dispense Refill   albuterol (PROAIR HFA) 108 (90 Base) MCG/ACT inhaler Inhale 2 puffs into the lungs every 6 (six) hours as needed for  wheezing or shortness of breath. 18 g 3   cyclobenzaprine (FLEXERIL) 10 MG tablet Take 1 tablet (10 mg total) by mouth 3 (three) times daily as needed for muscle spasms. 30 tablet 0   diazepam (VALIUM) 5 MG tablet Take 1 tablet (5 mg total) by mouth every 12 (twelve) hours as needed for anxiety. 30 tablet 1   diclofenac sodium (VOLTAREN) 1 % GEL Apply 4 g topically 4 (four) times daily. 100 g 2   famotidine (PEPCID) 20 MG tablet Take 1 tablet (20 mg total) by mouth 2 (two) times daily. 60 tablet 3   FLUoxetine (PROZAC) 20 MG tablet Take 1 tablet (20 mg total) by mouth daily. 90 tablet 3   fluticasone (FLONASE) 50 MCG/ACT nasal spray Place 2 sprays into both nostrils daily. 16 g 6   ibuprofen (ADVIL) 800 MG tablet Take 800 mg by mouth 3 (three) times daily as needed.     naproxen (NAPROSYN) 500 MG tablet Take 1 tablet (500 mg total) by mouth 2 (two) times daily with a meal. 28 tablet 0   Olopatadine HCl (PATADAY) 0.2 % SOLN Place 1 drop into both eyes daily as needed. 2.5 mL 5   omeprazole (PRILOSEC) 40 MG capsule Take 1 capsule (40 mg total) by mouth daily. 30 capsule 3   No current facility-administered medications for this visit.    Allergies: Ambien cr [zolpidem tartrate er], Covid-19 mrna vacc (moderna), Cymbalta [duloxetine hcl], Celebrex [celecoxib], Hydrocodone, Latex, and Mobic [meloxicam]  Past Medical History:  Diagnosis Date   Anxiety    Arrhythmia    Arthritis  Depression    Eating disorder    Fibromyalgia    GERD (gastroesophageal reflux disease)    IBS (irritable bowel syndrome)    Migraine    Seizures ([place])    last 2008    Past Surgical History:  Procedure Laterality Date   COLONOSCOPY Bilateral 2014   myomectomy Bilateral 04/2021    Family History  Problem Relation Age of Onset   Alcohol abuse Mother    Arthritis Mother    Lung cancer Mother    Hypertension Mother    Mental illness Mother    Alcohol abuse Father    Heart disease Father    Hypertension  Father    Diabetes Father    Colon cancer Father    Early death Sister    Arthritis Maternal Grandmother    Hypertension Maternal Grandmother    Breast cancer Maternal Grandmother    Stroke Maternal Grandfather    Hypertension Maternal Grandfather    Heart disease Paternal Grandmother    Hypertension Paternal Grandmother    Colon cancer Paternal Grandfather    Heart disease Paternal Grandfather    Hypertension Paternal Grandfather    Kidney disease Paternal Grandfather    Diabetes Paternal Grandfather    Stomach cancer Neg Hx    Esophageal cancer Neg Hx     Social History   Tobacco Use   Smoking status: Never   Smokeless tobacco: Never  Substance Use Topics   Alcohol use: Not Currently    Comment: wine on occasion    Subjective:   Concern for UTI; symptoms present x 2 weeks; complaining of urgency/ pressure sensation; feels like symptoms started after recent prednisone taper; notes that urine has strong smell; notes not currently sexually active; would prefer to not take any antibiotics until test results back and it is confirmed that she does indeed have UTI; no vaginal discharge; no blood in urine; no fever;   LMP 1 year;   Objective:  Vitals:   12/19/22 0947  BP: 128/72  Pulse: 80  SpO2: 99%  Weight: 209 lb (94.8 kg)  Height: 5\' 10"  (1.778 m)    General: Well developed, well nourished, in no acute distress  Skin : Warm and dry.  Head: Normocephalic and atraumatic  Lungs: Respirations unlabored;  Neurologic: Alert and oriented; speech intact; face symmetrical; moves all extremities well; CNII-XII intact without focal deficit   Assessment:  1. Dysuria     Plan:  Check U/A and urine culture; will also update vaginal swab; per patient request, will not do any prescriptive treatment until objective test results are available for review; continue to increase fluid intake; she would like to try OTC Monistat which is reasonable; follow up to be determined.   No  follow-ups on file.  Orders Placed This Encounter  Procedures   Urine Culture   POCT Urinalysis Dipstick (Automated)    Requested Prescriptions    No prescriptions requested or ordered in this encounter

## 2022-12-21 LAB — CERVICOVAGINAL ANCILLARY ONLY
Bacterial Vaginitis (gardnerella): NEGATIVE
Candida Glabrata: NEGATIVE
Candida Vaginitis: NEGATIVE
Chlamydia: NEGATIVE
Comment: NEGATIVE
Comment: NEGATIVE
Comment: NEGATIVE
Comment: NEGATIVE
Comment: NEGATIVE
Comment: NORMAL
Neisseria Gonorrhea: NEGATIVE
Trichomonas: NEGATIVE

## 2022-12-21 LAB — URINE CULTURE
MICRO NUMBER:: 14424667
SPECIMEN QUALITY:: ADEQUATE

## 2022-12-22 ENCOUNTER — Other Ambulatory Visit: Payer: Self-pay | Admitting: Family

## 2022-12-22 MED ORDER — NITROFURANTOIN MONOHYD MACRO 100 MG PO CAPS: 100.0000 mg | ORAL_CAPSULE | Freq: Two times a day (BID) | ORAL | 0 refills | Status: DC

## 2023-02-18 ENCOUNTER — Encounter: Payer: Self-pay | Admitting: Internal Medicine

## 2023-03-17 ENCOUNTER — Ambulatory Visit (INDEPENDENT_AMBULATORY_CARE_PROVIDER_SITE_OTHER): Payer: BC Managed Care – PPO | Admitting: Family Medicine

## 2023-03-17 VITALS — BP 120/90 | HR 67 | Temp 98.4°F | Resp 18 | Ht 70.0 in | Wt 212.4 lb

## 2023-03-17 DIAGNOSIS — F419 Anxiety disorder, unspecified: Secondary | ICD-10-CM

## 2023-03-17 DIAGNOSIS — R232 Flushing: Secondary | ICD-10-CM | POA: Diagnosis not present

## 2023-03-17 DIAGNOSIS — Z780 Asymptomatic menopausal state: Secondary | ICD-10-CM

## 2023-03-17 DIAGNOSIS — M791 Myalgia, unspecified site: Secondary | ICD-10-CM | POA: Diagnosis not present

## 2023-03-17 DIAGNOSIS — D649 Anemia, unspecified: Secondary | ICD-10-CM

## 2023-03-17 DIAGNOSIS — F32A Depression, unspecified: Secondary | ICD-10-CM

## 2023-03-17 LAB — CBC WITH DIFFERENTIAL/PLATELET
Basophils Absolute: 0 10*3/uL (ref 0.0–0.1)
Basophils Relative: 0.6 % (ref 0.0–3.0)
Eosinophils Absolute: 0.1 10*3/uL (ref 0.0–0.7)
Eosinophils Relative: 1.7 % (ref 0.0–5.0)
HCT: 36.1 % (ref 36.0–46.0)
Hemoglobin: 11.8 g/dL — ABNORMAL LOW (ref 12.0–15.0)
Lymphocytes Relative: 37.1 % (ref 12.0–46.0)
Lymphs Abs: 1.5 10*3/uL (ref 0.7–4.0)
MCHC: 32.6 g/dL (ref 30.0–36.0)
MCV: 89.4 fl (ref 78.0–100.0)
Monocytes Absolute: 0.4 10*3/uL (ref 0.1–1.0)
Monocytes Relative: 11 % (ref 3.0–12.0)
Neutro Abs: 2 10*3/uL (ref 1.4–7.7)
Neutrophils Relative %: 49.6 % (ref 43.0–77.0)
Platelets: 338 10*3/uL (ref 150.0–400.0)
RBC: 4.04 Mil/uL (ref 3.87–5.11)
RDW: 13.5 % (ref 11.5–15.5)
WBC: 4 10*3/uL (ref 4.0–10.5)

## 2023-03-17 LAB — SEDIMENTATION RATE: Sed Rate: 28 mm/hr (ref 0–30)

## 2023-03-17 LAB — VITAMIN D 25 HYDROXY (VIT D DEFICIENCY, FRACTURES): VITD: 53.56 ng/mL (ref 30.00–100.00)

## 2023-03-17 LAB — VITAMIN B12: Vitamin B-12: 745 pg/mL (ref 211–911)

## 2023-03-17 MED ORDER — FLUOXETINE HCL 20 MG PO CAPS: 20.0000 mg | ORAL_CAPSULE | Freq: Every day | ORAL | 1 refills | Status: DC

## 2023-03-17 NOTE — Progress Notes (Signed)
Subjective:   By signing my name below, I, Christoper Allegra, attest that this documentation has been prepared under the direction and in the presence of Donato Schultz, DO. 03/17/2023   Patient ID: Suzanne Singh, female    DOB: 05-Feb-1966, 57 y.o.   MRN: [id number]  Chief Complaint  Patient presents with   Medication Management    Pt states wanting to discuss Prozac. Pt has not taking Prozac since August.     HPI Patient is in today for office visit.  Since she had Covid-19, ringing in her ears has increased She also complains of eye twitching.   She also complains of her fatigue and hot flashes from menopause has worsened. She does intermittent fasting and no longer eat gluten, bacon and red met. She's currently off work on [employer]. She is currently taking berberine, probiotics, cranberry,Black seed oil, and super green. Her iron has gotten lower and she takes over the counter medication for it.   She complains of issues while urinating and is planning on seeing her GYN specialist to manage it.    Past Medical History:  Diagnosis Date   Anxiety    Arrhythmia    Arthritis    Depression    Eating disorder    Fibromyalgia    GERD (gastroesophageal reflux disease)    IBS (irritable bowel syndrome)    Migraine    Seizures    last 2008    Past Surgical History:  Procedure Laterality Date   COLONOSCOPY Bilateral 2014   myomectomy Bilateral 04/2021    Family History  Problem Relation Age of Onset   Alcohol abuse Mother    Arthritis Mother    Lung cancer Mother    Hypertension Mother    Mental illness Mother    Alcohol abuse Father    Heart disease Father    Hypertension Father    Diabetes Father    Colon cancer Father    Early death Sister    Arthritis Maternal Grandmother    Hypertension Maternal Grandmother    Breast cancer Maternal Grandmother    Stroke Maternal Grandfather    Hypertension Maternal Grandfather    Heart disease Paternal Grandmother     Hypertension Paternal Grandmother    Colon cancer Paternal Grandfather    Heart disease Paternal Grandfather    Hypertension Paternal Grandfather    Kidney disease Paternal Grandfather    Diabetes Paternal Grandfather    Stomach cancer Neg Hx    Esophageal cancer Neg Hx     Social History   Socioeconomic History   Marital status: Married    Spouse name: Not on file   Number of children: 2   Years of education: Not on file   Highest education level: Master's degree (e.g., MA, MS, MEng, MEd, MSW, MBA)  Occupational History   Occupation: Runner, broadcasting/film/video  Tobacco Use   Smoking status: Never   Smokeless tobacco: Never  Vaping Use   Vaping Use: Never used  Substance and Sexual Activity   Alcohol use: Not Currently    Comment: wine on occasion   Drug use: No   Sexual activity: Not Currently  Other Topics Concern   Not on file  Social History Narrative   Middle school English teacher, [employer]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   2 sons      Exercise-no   Social Determinants of Health   Financial Resource Strain: Medium Risk (03/17/2023)   Overall Financial  Resource Strain (CARDIA)    Difficulty of Paying Living Expenses: Somewhat hard  Food Insecurity: No Food Insecurity (03/17/2023)   Hunger Vital Sign    Worried About Running Out of Food in the Last Year: Never true    Ran Out of Food in the Last Year: Never true  Transportation Needs: No Transportation Needs (03/17/2023)   PRAPARE - Administrator, Civil Service (Medical): No    Lack of Transportation (Non-Medical): No  Physical Activity: Insufficiently Active (03/17/2023)   Exercise Vital Sign    Days of Exercise per Week: 3 days    Minutes of Exercise per Session: 30 min  Stress: Stress Concern Present (03/17/2023)   [employer] of Occupational Health - Occupational Stress Questionnaire    Feeling of Stress : Rather much  Social Connections: Moderately Integrated  (03/17/2023)   Social Connection and Isolation Panel [NHANES]    Frequency of Communication with Friends and Family: Once a week    Frequency of Social Gatherings with Friends and Family: Once a week    Attends Religious Services: 1 to 4 times per year    Active Member of [employer] or Organizations: Yes    Attends Engineer, structural: More than 4 times per year    Marital Status: Married  Catering manager Violence: Not on file    Outpatient Medications Prior to Visit  Medication Sig Dispense Refill   albuterol (PROAIR HFA) 108 (90 Base) MCG/ACT inhaler Inhale 2 puffs into the lungs every 6 (six) hours as needed for wheezing or shortness of breath. 18 g 3   cyclobenzaprine (FLEXERIL) 10 MG tablet Take 1 tablet (10 mg total) by mouth 3 (three) times daily as needed for muscle spasms. 30 tablet 0   diazepam (VALIUM) 5 MG tablet Take 1 tablet (5 mg total) by mouth every 12 (twelve) hours as needed for anxiety. 30 tablet 1   diclofenac sodium (VOLTAREN) 1 % GEL Apply 4 g topically 4 (four) times daily. 100 g 2   famotidine (PEPCID) 20 MG tablet Take 1 tablet (20 mg total) by mouth 2 (two) times daily. 60 tablet 3   FLUoxetine (PROZAC) 20 MG tablet Take 1 tablet (20 mg total) by mouth daily. 90 tablet 3   fluticasone (FLONASE) 50 MCG/ACT nasal spray Place 2 sprays into both nostrils daily. 16 g 6   ibuprofen (ADVIL) 800 MG tablet Take 800 mg by mouth 3 (three) times daily as needed.     naproxen (NAPROSYN) 500 MG tablet Take 1 tablet (500 mg total) by mouth 2 (two) times daily with a meal. 28 tablet 0   Olopatadine HCl (PATADAY) 0.2 % SOLN Place 1 drop into both eyes daily as needed. 2.5 mL 5   omeprazole (PRILOSEC) 40 MG capsule Take 1 capsule (40 mg total) by mouth daily. 30 capsule 3   nitrofurantoin, macrocrystal-monohydrate, (MACROBID) 100 MG capsule Take 1 capsule (100 mg total) by mouth 2 (two) times daily. (Patient not taking: Reported on 03/17/2023) 14 capsule 0   No  facility-administered medications prior to visit.    Allergies  Allergen Reactions   Ambien Cr [Zolpidem Tartrate Er]     Seizure    Covid-19 Mrna Vacc (Moderna) Swelling   Cymbalta [Duloxetine Hcl]     Seizure    Celebrex [Celecoxib]     Hives    Hydrocodone Itching   Latex Rash    Adhesive   Mobic [Meloxicam] Palpitations    Review of Systems  Constitutional:  Positive for malaise/fatigue. Negative for fever.       (+)hot flashes  HENT:  Positive for tinnitus ((+) ringing in ears). Negative for congestion.   Eyes:  Negative for blurred vision.       (+) eyes twitch/jump  Respiratory:  Negative for shortness of breath.   Cardiovascular:  Negative for chest pain, palpitations and leg swelling.  Gastrointestinal:  Negative for abdominal pain, blood in stool and nausea.  Genitourinary:  Negative for dysuria and frequency.  Musculoskeletal:  Negative for falls.  Skin:  Negative for rash.  Neurological:  Negative for dizziness, loss of consciousness and headaches.  Endo/Heme/Allergies:  Negative for environmental allergies.  Psychiatric/Behavioral:  Negative for depression. The patient is not nervous/anxious.        Objective:    Physical Exam Vitals and nursing note reviewed.  Constitutional:      Appearance: Normal appearance. She is well-developed.  HENT:     Head: Normocephalic and atraumatic.     Right Ear: Tympanic membrane, ear canal and external ear normal.     Left Ear: Tympanic membrane, ear canal and external ear normal.  Eyes:     Extraocular Movements: Extraocular movements intact.     Conjunctiva/sclera: Conjunctivae normal.     Pupils: Pupils are equal, round, and reactive to light.  Neck:     Thyroid: No thyromegaly.     Vascular: No carotid bruit or JVD.  Cardiovascular:     Rate and Rhythm: Normal rate and regular rhythm.     Heart sounds: Normal heart sounds. No murmur heard. Pulmonary:     Effort: Pulmonary effort is normal. No respiratory  distress.     Breath sounds: Normal breath sounds. No wheezing or rales.  Chest:     Chest wall: No tenderness.  Musculoskeletal:     Cervical back: Normal range of motion and neck supple.  Skin:    General: Skin is warm and dry.  Neurological:     Mental Status: She is alert and oriented to person, place, and time.  Psychiatric:        Attention and Perception: Attention normal.        Mood and Affect: Mood is depressed.        Speech: Speech normal.        Behavior: Behavior normal.        Thought Content: Thought content normal.        Cognition and Memory: Cognition normal.        Judgment: Judgment normal.     BP (!) 120/90 (BP Location: Left Arm, Patient Position: Sitting, Cuff Size: Large)   Pulse 67   Temp 98.4 F (36.9 C) (Oral)   Resp 18   Ht 5\' 10"  (1.778 m)   Wt 212 lb 6.4 oz (96.3 kg)   SpO2 98%   BMI 30.48 kg/m  Wt Readings from Last 3 Encounters:  03/17/23 212 lb 6.4 oz (96.3 kg)  12/19/22 209 lb (94.8 kg)  11/28/22 208 lb 6.4 oz (94.5 kg)       Assessment & Plan:  Hot flashes -     FLUoxetine HCl; Take 1 capsule (20 mg total) by mouth daily.  Dispense: 90 capsule; Refill: 1 -     Thyroid Panel With TSH -     Thyroglobulin antibody -     CBC with Differential/Platelet -     Iron, TIBC and Ferritin Panel  Anemia, unspecified type -     Thyroid Panel With TSH -  Thyroglobulin antibody -     CBC with Differential/Platelet -     Iron, TIBC and Ferritin Panel -     Vitamin B12 -     VITAMIN D 25 Hydroxy (Vit-D Deficiency, Fractures)  Myalgia -     Thyroid Panel With TSH -     Thyroglobulin antibody -     CBC with Differential/Platelet -     Iron, TIBC and Ferritin Panel -     Sedimentation rate -     Rheumatoid factor -     ANA -     Vitamin B12 -     VITAMIN D 25 Hydroxy (Vit-D Deficiency, Fractures)  Anxiety and depression Assessment & Plan: Restart prozac  F/u 6 months   Menopause Assessment & Plan: Per gyn  Prozac may help  with hot flashes      I, Donato SchultzYvonne R Lowne Chase, DO, personally preformed the services described in this documentation.  All medical record entries made by the scribe were at my direction and in my presence.  I have reviewed the chart and discharge instructions (if applicable) and agree that the record reflects my personal performance and is accurate and complete. 03/17/2023   Carver FilaI,Zakirra M Oliver,acting as a scribe for Donato SchultzYvonne R Lowne Chase, DO.,have documented all relevant documentation on the behalf of Donato SchultzYvonne R Lowne Chase, DO,as directed by  Donato SchultzYvonne R Lowne Chase, DO while in the presence of Donato SchultzYvonne R Lowne Chase, DO.   Donato SchultzYvonne R Lowne Chase, DO

## 2023-03-17 NOTE — Assessment & Plan Note (Signed)
Per gyn  Prozac may help with hot flashes

## 2023-03-17 NOTE — Assessment & Plan Note (Signed)
Restart prozac  F/u 6 months

## 2023-03-18 LAB — THYROID PANEL WITH TSH
T3 Uptake: 25 % (ref 22–35)
T4, Total: 9.2 ug/dL (ref 5.1–11.9)

## 2023-03-18 LAB — IRON,TIBC AND FERRITIN PANEL: TIBC: 378 mcg/dL (calc) (ref 250–450)

## 2023-03-18 LAB — RHEUMATOID FACTOR: Rheumatoid fact SerPl-aCnc: <14 IU/mL (ref <14)

## 2023-03-20 LAB — IRON,TIBC AND FERRITIN PANEL
%SAT: 22 % (calc) (ref 16–45)
Ferritin: 29 ng/mL (ref 16–232)
Iron: 85 ug/dL (ref 45–160)

## 2023-03-20 LAB — THYROGLOBULIN ANTIBODY: Thyroglobulin Ab: <1 IU/mL (ref <=1)

## 2023-03-20 LAB — THYROID PANEL WITH TSH
Free Thyroxine Index: 2.3 (ref 1.4–3.8)
TSH: 2.04 mIU/L

## 2023-03-20 LAB — ANA: Anti Nuclear Antibody (ANA): NEGATIVE

## 2023-03-26 ENCOUNTER — Other Ambulatory Visit: Payer: Self-pay | Admitting: Family Medicine

## 2023-03-26 ENCOUNTER — Telehealth: Payer: Self-pay | Admitting: Family Medicine

## 2023-03-26 DIAGNOSIS — M542 Cervicalgia: Secondary | ICD-10-CM

## 2023-03-26 DIAGNOSIS — F418 Other specified anxiety disorders: Secondary | ICD-10-CM

## 2023-03-26 MED ORDER — DIAZEPAM 5 MG PO TABS
5.0000 mg | ORAL_TABLET | Freq: Two times a day (BID) | ORAL | 1 refills | Status: DC | PRN
Start: 2023-03-26 — End: 2023-05-25

## 2023-03-26 NOTE — Telephone Encounter (Signed)
Pt also wanted to know if she could double the dosage on her prozac as she is currently dealing with her mom being in the ICU.  Prescription Request  03/26/2023  Is this a "Controlled Substance" medicine? Yes  LOV: 03/17/2023  What is the name of the medication or equipment?   diazepam (VALIUM) 5 MG tablet [[id number]]   Have you contacted your pharmacy to request a refill? No   Which pharmacy would you like this sent to?   [id number] [place], [place] - [address] AT [facility] [address] & [address] [place] [address] Phone: [phone] Fax: [phone]  Patient notified that their request is being sent to the clinical staff for review and that they should receive a response within 2 business days.   Please advise at Mobile [phone] (mobile)

## 2023-03-26 NOTE — Telephone Encounter (Signed)
Requesting: diazepam  Contract: No UDS: no Last Visit: 03/17/2023 Next Visit: Visit date not found Last Refill: 03/21/21  Please Advise

## 2023-05-25 ENCOUNTER — Ambulatory Visit (HOSPITAL_BASED_OUTPATIENT_CLINIC_OR_DEPARTMENT_OTHER)
Admission: RE | Admit: 2023-05-25 | Discharge: 2023-05-25 | Disposition: A | Discharge status: Home / Self Care | Payer: BC Managed Care – PPO | Source: Ambulatory Visit | Attending: Family Medicine | Admitting: Family Medicine

## 2023-05-25 ENCOUNTER — Encounter: Payer: Self-pay | Admitting: Family Medicine

## 2023-05-25 ENCOUNTER — Ambulatory Visit (INDEPENDENT_AMBULATORY_CARE_PROVIDER_SITE_OTHER): Payer: BC Managed Care – PPO | Admitting: Family Medicine

## 2023-05-25 VITALS — BP 100/80 | HR 73 | Temp 98.6°F | Resp 18 | Ht 70.0 in | Wt 195.6 lb

## 2023-05-25 DIAGNOSIS — M79671 Pain in right foot: Secondary | ICD-10-CM | POA: Insufficient documentation

## 2023-05-25 DIAGNOSIS — M79661 Pain in right lower leg: Secondary | ICD-10-CM | POA: Insufficient documentation

## 2023-05-25 DIAGNOSIS — F418 Other specified anxiety disorders: Secondary | ICD-10-CM

## 2023-05-25 DIAGNOSIS — M25571 Pain in right ankle and joints of right foot: Secondary | ICD-10-CM | POA: Insufficient documentation

## 2023-05-25 MED ORDER — DIAZEPAM 10 MG PO TABS
10.0000 mg | ORAL_TABLET | Freq: Two times a day (BID) | ORAL | 1 refills | Status: AC | PRN
Start: 2023-05-25 — End: ?

## 2023-05-25 NOTE — Progress Notes (Signed)
Established Patient Office Visit  Subjective   Patient ID: Suzanne Singh, female    DOB: 1965-12-15  Age: 57 y.o. MRN: [id number]  Chief Complaint  Patient presents with   Leg Pain    Right leg and foot pain, x2 weeks, pt states having a knot on top of the foot and jammed her toe. Pain with walking.    HPI Discussed the use of AI scribe software for clinical note transcription with the patient, who gave verbal consent to proceed.  History of Present Illness   They report hitting their foot against a bed railing two weeks ago, resulting in swelling and a knot formation. Despite icing and applying black seed oil, the swelling persists, leading the patient to suspect a fracture. The patient also reports pain in the back of their right knee and on the side, which has resulted in a limp. They deny any falls but have been doing a lot of walking and lifting due to caring for their mother who is on hospice care at home. The patient has been taking Tylenol arthritis for pain and diazepam for sleep, but reports that the diazepam has not been effective.      Patient Active Problem List   Diagnosis Date Noted   Acute right ankle pain 05/25/2023   Right foot pain 05/25/2023   Right calf pain 05/25/2023   Acute left-sided low back pain with left-sided sciatica 11/28/2022   Congenital single kidney 06/16/2022   Chest pressure 03/17/2022   Dysphagia 03/17/2022   Whiplash injury to neck 10/03/2021   DDD (degenerative disc disease), cervical 03/21/2021   Palpitations 10/04/2020   Tachycardia 06/14/2020   Shortness of breath on exertion 06/14/2020   Memory loss 06/14/2020   Brain fog 06/14/2020   Benign paroxysmal positional vertigo due to bilateral vestibular disorder 06/14/2020   Tinnitus of both ears 06/14/2020   Physical deconditioning 06/14/2020   Essential hypertension 05/16/2020   Stress headache 05/16/2020   SOB (shortness of breath) 05/16/2020   Immunization reaction 05/16/2020   Neck  pain on right side 03/31/2018   Viral URI with cough 04/25/2017   Abscess 12/27/2016   Left anterior shoulder pain 06/28/2015   Skin infection 01/12/2015   Breast pain 01/12/2015   UTI (urinary tract infection) 11/16/2014   Weight gain 11/16/2014   Bilateral knee pain 09/21/2014   Thyromegaly 09/21/2014   Obesity (BMI 30-39.9) 06/21/2014   Edema 04/07/2014   Maxillary sinusitis 11/07/2013   Elevated BP 11/07/2013   Insomnia 07/28/2013   Epigastric pain 12/06/2012   Menopause 12/06/2012   IBS (irritable bowel syndrome) 12/06/2012   Fatigue 12/06/2012   Family hx of colon cancer in grandparent 12/06/2012   Screening for malignant neoplasm of the cervix 07/22/2012   Preventative health care 07/22/2012   Depression with anxiety 07/22/2012   Eating disorder 07/22/2012   GERD (gastroesophageal reflux disease) 07/22/2012   Fibromyalgia 07/22/2012   Past Medical History:  Diagnosis Date   Anxiety    Arrhythmia    Arthritis    Depression    Eating disorder    Fibromyalgia    GERD (gastroesophageal reflux disease)    IBS (irritable bowel syndrome)    Migraine    Seizures (HCC)    last 2008   Past Surgical History:  Procedure Laterality Date   COLONOSCOPY Bilateral 2014   myomectomy Bilateral 04/2021   Social History   Tobacco Use   Smoking status: Never   Smokeless tobacco: Never  Vaping Use  Vaping Use: Never used  Substance Use Topics   Alcohol use: Not Currently    Comment: wine on occasion   Drug use: No   Social History   Socioeconomic History   Marital status: Married    Spouse name: Not on file   Number of children: 2   Years of education: Not on file   Highest education level: Master's degree (e.g., MA, MS, MEng, MEd, MSW, MBA)  Occupational History   Occupation: Runner, broadcasting/film/video  Tobacco Use   Smoking status: Never   Smokeless tobacco: Never  Vaping Use   Vaping Use: Never used  Substance and Sexual Activity   Alcohol use: Not Currently    Comment:  wine on occasion   Drug use: No   Sexual activity: Not Currently  Other Topics Concern   Not on file  Social History Narrative   Middle school English teacher, [employer]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   2 sons      Exercise-no   Social Determinants of Health   Financial Resource Strain: Medium Risk (03/17/2023)   Overall Financial Resource Strain (CARDIA)    Difficulty of Paying Living Expenses: Somewhat hard  Food Insecurity: No Food Insecurity (03/17/2023)   Hunger Vital Sign    Worried About Running Out of Food in the Last Year: Never true    Ran Out of Food in the Last Year: Never true  Transportation Needs: No Transportation Needs (03/17/2023)   PRAPARE - Administrator, Civil Service (Medical): No    Lack of Transportation (Non-Medical): No  Physical Activity: Insufficiently Active (03/17/2023)   Exercise Vital Sign    Days of Exercise per Week: 3 days    Minutes of Exercise per Session: 30 min  Stress: Stress Concern Present (03/17/2023)   [employer] of Occupational Health - Occupational Stress Questionnaire    Feeling of Stress : Rather much  Social Connections: Moderately Integrated (03/17/2023)   Social Connection and Isolation Panel [NHANES]    Frequency of Communication with Friends and Family: Once a week    Frequency of Social Gatherings with Friends and Family: Once a week    Attends Religious Services: 1 to 4 times per year    Active Member of [employer] or Organizations: Yes    Attends Engineer, structural: More than 4 times per year    Marital Status: Married  Catering manager Violence: Not on file   Family Status  Relation Name Status   Mother  (Not Specified)   Father  (Not Specified)   Sister half Deceased   MGM  (Not Specified)   MGF  (Not Specified)   PGM  (Not Specified)   PGF  (Not Specified)   Neg Hx  (Not Specified)   Family History  Problem Relation Age of Onset   Alcohol  abuse Mother    Arthritis Mother    Lung cancer Mother    Hypertension Mother    Mental illness Mother    Alcohol abuse Father    Heart disease Father    Hypertension Father    Diabetes Father    Colon cancer Father    Early death Sister    Arthritis Maternal Grandmother    Hypertension Maternal Grandmother    Breast cancer Maternal Grandmother    Stroke Maternal Grandfather    Hypertension Maternal Grandfather    Heart disease Paternal Grandmother    Hypertension Paternal Grandmother    Colon cancer Paternal  Grandfather    Heart disease Paternal Grandfather    Hypertension Paternal Grandfather    Kidney disease Paternal Grandfather    Diabetes Paternal Grandfather    Stomach cancer Neg Hx    Esophageal cancer Neg Hx    Allergies  Allergen Reactions   Ambien Cr [Zolpidem Tartrate Er]     Seizure    Covid-19 Mrna Vacc (Moderna) Swelling   Cymbalta [Duloxetine Hcl]     Seizure    Celebrex [Celecoxib]     Hives    Hydrocodone Itching   Latex Rash    Adhesive   Mobic [Meloxicam] Palpitations      ROS    Objective:     BP 100/80 (BP Location: Right Arm, Patient Position: Sitting, Cuff Size: Large)   Pulse 73   Temp 98.6 F (37 C) (Oral)   Resp 18   Ht 5\' 10"  (1.778 m)   Wt 195 lb 9.6 oz (88.7 kg)   SpO2 98%   BMI 28.07 kg/m  BP Readings from Last 3 Encounters:  05/25/23 100/80  03/17/23 (!) 120/90  12/19/22 128/72   Wt Readings from Last 3 Encounters:  05/25/23 195 lb 9.6 oz (88.7 kg)  03/17/23 212 lb 6.4 oz (96.3 kg)  12/19/22 209 lb (94.8 kg)   SpO2 Readings from Last 3 Encounters:  05/25/23 98%  03/17/23 98%  12/19/22 99%      Physical Exam Vitals and nursing note reviewed.  Constitutional:      Appearance: She is well-developed.  HENT:     Head: Normocephalic and atraumatic.  Eyes:     Conjunctiva/sclera: Conjunctivae normal.  Neck:     Thyroid: No thyromegaly.     Vascular: No carotid bruit or JVD.  Cardiovascular:     Rate and  Rhythm: Normal rate and regular rhythm.     Heart sounds: Normal heart sounds. No murmur heard. Pulmonary:     Effort: Pulmonary effort is normal. No respiratory distress.     Breath sounds: Normal breath sounds. No wheezing or rales.  Chest:     Chest wall: No tenderness.  Musculoskeletal:        General: Swelling, tenderness and signs of injury present.     Cervical back: Normal range of motion and neck supple.     Right lower leg: Swelling and tenderness present.     Right ankle: Swelling present. Tenderness present.     Right foot: Decreased range of motion. Tenderness present.  Neurological:     Mental Status: She is alert and oriented to person, place, and time.      No results found for any visits on 05/25/23.  Last CBC Lab Results  Component Value Date   WBC 4.0 03/17/2023   HGB 11.8 (L) 03/17/2023   HCT 36.1 03/17/2023   MCV 89.4 03/17/2023   MCH 28.7 06/18/2020   RDW 13.5 03/17/2023   PLT 338.0 03/17/2023   Last metabolic panel Lab Results  Component Value Date   GLUCOSE 97 03/17/2022   NA 142 03/17/2022   K 3.8 03/17/2022   CL 103 03/17/2022   CO2 32 03/17/2022   BUN 19 03/17/2022   CREATININE 0.81 03/17/2022   GFRNONAA 89 06/18/2020   CALCIUM 9.9 03/17/2022   PROT 7.1 03/17/2022   ALBUMIN 4.0 03/17/2022   LABGLOB 2.7 06/18/2020   AGRATIO 1.6 06/18/2020   BILITOT 0.3 03/17/2022   ALKPHOS 98 03/17/2022   AST 24 03/17/2022   ALT 15 03/17/2022   Last lipids  Lab Results  Component Value Date   CHOL 195 11/28/2021   HDL 59.10 11/28/2021   LDLCALC 125 (H) 11/28/2021   TRIG 55.0 11/28/2021   CHOLHDL 3 11/28/2021   Last hemoglobin A1c No results found for: "HGBA1C" Last thyroid functions Lab Results  Component Value Date   TSH 2.04 03/17/2023   T4TOTAL 9.2 03/17/2023   Last vitamin D Lab Results  Component Value Date   VD25OH 53.56 03/17/2023   Last vitamin B12 and Folate Lab Results  Component Value Date   VITAMINB12 745 03/17/2023       The 10-year ASCVD risk score (Arnett DK, et al., 2019) is: 1.3%    Assessment & Plan:   Problem List Items Addressed This Visit       Unprioritized   Right foot pain    Xray  Ice  Rest elevate      Relevant Orders   DG Foot Complete Right   Right calf pain    US venous ? Pulled muscle from lifting her mother       Relevant Orders   US Venous Img Lower Unilateral Right (DVT)   Depression with anxiety   Relevant Medications   diazepam (VALIUM) 10 MG tablet   Acute right ankle pain - Primary    ? Sprain Check xray       Relevant Orders   DG Ankle Complete Right    No follow-ups on file.    Donato Schultz, DO

## 2023-05-25 NOTE — Assessment & Plan Note (Signed)
US venous ? Pulled muscle from lifting her mother

## 2023-05-25 NOTE — Assessment & Plan Note (Signed)
?   Sprain Check xray

## 2023-05-25 NOTE — Assessment & Plan Note (Signed)
Xray  Ice  Rest elevate

## 2023-05-26 ENCOUNTER — Other Ambulatory Visit: Payer: Self-pay

## 2023-05-26 DIAGNOSIS — M25861 Other specified joint disorders, right knee: Secondary | ICD-10-CM

## 2023-05-28 ENCOUNTER — Ambulatory Visit (INDEPENDENT_AMBULATORY_CARE_PROVIDER_SITE_OTHER): Payer: BC Managed Care – PPO | Admitting: Family Medicine

## 2023-05-28 ENCOUNTER — Other Ambulatory Visit: Payer: Self-pay

## 2023-05-28 ENCOUNTER — Encounter: Payer: Self-pay | Admitting: Family Medicine

## 2023-05-28 VITALS — BP 110/70 | Ht 70.0 in | Wt 195.0 lb

## 2023-05-28 DIAGNOSIS — M25561 Pain in right knee: Secondary | ICD-10-CM | POA: Diagnosis not present

## 2023-05-28 DIAGNOSIS — M79671 Pain in right foot: Secondary | ICD-10-CM

## 2023-05-28 MED ORDER — TRIAMCINOLONE ACETONIDE 40 MG/ML IJ SUSP
40.0000 mg | Freq: Once | INTRAMUSCULAR | Status: AC
Start: 2023-05-28 — End: 2023-05-28
  Administered 2023-05-28: 40 mg via INTRA_ARTICULAR

## 2023-05-28 NOTE — Progress Notes (Signed)
PCP: Donato Schultz, DO  Subjective:   HPI: Patient is a 57 y.o. female here for right foot and knee pain.  Patient reports about 2 weeks ago she was visiting her mother in the hospital when she accidentally kicked the edge of the hospital bed. Immediate pain, swelling lateral foot. This has persisted and radiated more proximally to anterior right ankle. Had x-rays that were negative. Worse with ambulation. Also reports posterior right knee pain. Had a doppler ultrasound negative for DVT but found baker's cyst. She has known arthritis.  Past Medical History:  Diagnosis Date   Anxiety    Arrhythmia    Arthritis    Depression    Eating disorder    Fibromyalgia    GERD (gastroesophageal reflux disease)    IBS (irritable bowel syndrome)    Migraine    Seizures (HCC)    last 2008    Current Outpatient Medications on File Prior to Visit  Medication Sig Dispense Refill   albuterol (PROAIR HFA) 108 (90 Base) MCG/ACT inhaler Inhale 2 puffs into the lungs every 6 (six) hours as needed for wheezing or shortness of breath. 18 g 3   cyclobenzaprine (FLEXERIL) 10 MG tablet Take 1 tablet (10 mg total) by mouth 3 (three) times daily as needed for muscle spasms. 30 tablet 0   diazepam (VALIUM) 10 MG tablet Take 1 tablet (10 mg total) by mouth every 12 (twelve) hours as needed for anxiety. 30 tablet 1   diclofenac sodium (VOLTAREN) 1 % GEL Apply 4 g topically 4 (four) times daily. 100 g 2   famotidine (PEPCID) 20 MG tablet Take 1 tablet (20 mg total) by mouth 2 (two) times daily. 60 tablet 3   FLUoxetine (PROZAC) 20 MG capsule Take 1 capsule (20 mg total) by mouth daily. 90 capsule 1   FLUoxetine (PROZAC) 20 MG tablet Take 1 tablet (20 mg total) by mouth daily. 90 tablet 3   fluticasone (FLONASE) 50 MCG/ACT nasal spray Place 2 sprays into both nostrils daily. 16 g 6   ibuprofen (ADVIL) 800 MG tablet Take 800 mg by mouth 3 (three) times daily as needed.     naproxen (NAPROSYN) 500 MG  tablet Take 1 tablet (500 mg total) by mouth 2 (two) times daily with a meal. 28 tablet 0   Olopatadine HCl (PATADAY) 0.2 % SOLN Place 1 drop into both eyes daily as needed. 2.5 mL 5   omeprazole (PRILOSEC) 40 MG capsule Take 1 capsule (40 mg total) by mouth daily. 30 capsule 3   No current facility-administered medications on file prior to visit.    Past Surgical History:  Procedure Laterality Date   COLONOSCOPY Bilateral 2014   myomectomy Bilateral 04/2021    Allergies  Allergen Reactions   Ambien Cr [Zolpidem Tartrate Er]     Seizure    Covid-19 Mrna Vacc (Moderna) Swelling   Cymbalta [Duloxetine Hcl]     Seizure    Celebrex [Celecoxib]     Hives    Hydrocodone Itching   Latex Rash    Adhesive   Mobic [Meloxicam] Palpitations    BP 110/70 (BP Location: Left Arm, Patient Position: Sitting)   Ht 5\' 10"  (1.778 m)   Wt 195 lb (88.5 kg)   BMI 27.98 kg/m       No data to display              No data to display  Objective:  Physical Exam:  Gen: NAD, comfortable in exam room  Right foot/ankle: Mild soft tissue swelling throughout. No other gross deformity, swelling, ecchymoses FROM - discomfort with toe flexion. TTP 5th metatarsal shaft.  Less dorsal foot throughout. Negative anterior drawer, talar tilt. Thompsons test negative. NV intact distally.  Right knee: Mild effusion.  Fullness posterior knee.  No other deformity. No joint line TTP.  Mild pain popliteal fossa. FROM with normal strength. Negative ant/post drawers. Negative valgus/varus testing. Negative lachman.  Negative mcmurrays, apleys.  NV intact distally.  MSK u/s right foot and knee:  Cortical irregularity dorsolateral 5th metatarsal with soft tissue swelling.  Consistent with 5th metatarsal shaft fracture.  Small baker's cyst right knee.  Mild effusion of knee.   Assessment & Plan:  1. Right foot injury - radiographs negative.  Ultrasound demonstrates a 5th metatarsal  fracture with early healing.  Cam walker when ambulatory for next 4 weeks.  Icing, tylenol or aleve if needed.   2. Right knee pain - posterior, suspect baker's cyst from knee arthritis as cause of this discomfort.  Discussed options - went ahead with injection today.  Icing, compression, elevation.  After informed written consent timeout was performed, patient was lying supine on exam table. Right knee was prepped with alcohol swab and utilizing superolateral approach with ultrasound guidance, patient's right knee was injected intraarticularly with 3:1 lidocaine: depomedrol. Patient tolerated the procedure well without immediate complications.

## 2023-05-28 NOTE — Patient Instructions (Signed)
You have a 5th metatarsal fracture. Boot or postop shoe when walking around for next 4 weeks. Icing 15 minutes at a time as needed. Tylenol, aleve as needed. Your knee pain is due to the baker's cyst from knee arthritis. You were injected here today. Compression sleeve or ace wrap during the day to help with swelling. Icing as needed. Follow up with me in 4 weeks.

## 2023-06-26 ENCOUNTER — Encounter: Payer: Self-pay | Admitting: Family Medicine

## 2023-06-26 ENCOUNTER — Telehealth (INDEPENDENT_AMBULATORY_CARE_PROVIDER_SITE_OTHER): Payer: BC Managed Care – PPO | Admitting: Family Medicine

## 2023-06-26 DIAGNOSIS — U099 Post covid-19 condition, unspecified: Secondary | ICD-10-CM | POA: Insufficient documentation

## 2023-06-26 DIAGNOSIS — F418 Other specified anxiety disorders: Secondary | ICD-10-CM

## 2023-06-26 MED ORDER — FLUOXETINE HCL 40 MG PO CAPS
40.0000 mg | ORAL_CAPSULE | Freq: Every day | ORAL | 3 refills | Status: DC
Start: 2023-06-26 — End: 2023-09-25

## 2023-06-26 NOTE — Progress Notes (Signed)
MyChart Video Visit    Virtual Visit via Video Note   This patient is at least at moderate risk for complications without adequate follow up. This format is felt to be most appropriate for this patient at this time. Physical exam was limited by quality of the video and audio technology used for the visit. [name] was able to get the patient set up on a video visit.  Patient location: home Patient and provider in visit Provider location: Office  I discussed the limitations of evaluation and management by telemedicine and the availability of in person appointments. The patient expressed understanding and agreed to proceed.  Visit Date: 06/26/2023  Today's healthcare provider: Donato Schultz, DO     Subjective:    Patient ID: Suzanne Singh, female    DOB: 07-26-1966, 57 y.o.   MRN: [id number]  Chief Complaint  Patient presents with   FMLA    HPI Patient is in today to discuss extending her fmla. Discussed the use of AI scribe software for clinical note transcription with the patient, who gave verbal consent to proceed.  History of Present Illness   The patient, with a history of COVID-19, presents with multiple ongoing health concerns. She reports persistent post-COVID symptoms, including tinnitus and joint pain. She recently broke her toe and is currently wearing a boot, which has limited her mobility and ability to drive. She has been experiencing urinary issues, with blood, protein, and white blood cells found in her urine. She has been referred to a urologist and has undergone bladder ablation.  The patient has also been experiencing gastrointestinal issues, including a sensation of a knot in her stomach and difficulty eating. She reports having lost 20 pounds, though she is unsure if this is due to stress or her ongoing health issues. She has had her esophagus stretched, which has provided some relief, but she continues to experience discomfort. She is scheduled for a  colonoscopy to further investigate these symptoms.  In addition to these physical health concerns, the patient is dealing with significant stress. Her mother, who was previously in her care, is now on hospice and will be moving to a rest home. The patient is also dealing with the stress of potentially needing to take early retirement due to her health issues. She is currently on medical leave and is unsure if she will be able to return to her previous position. She is taking fluoxetine for her mental health, but is unsure if it is helping.  The patient has a history of counseling, but has not been able to continue due to her current health issues. She expresses a desire for accommodations at work upon her return, or she may need to consider early retirement.       Past Medical History:  Diagnosis Date   Anxiety    Arrhythmia    Arthritis    Depression    Eating disorder    Fibromyalgia    GERD (gastroesophageal reflux disease)    IBS (irritable bowel syndrome)    Migraine    Seizures (HCC)    last 2008    Past Surgical History:  Procedure Laterality Date   COLONOSCOPY Bilateral 2014   myomectomy Bilateral 04/2021    Family History  Problem Relation Age of Onset   Alcohol abuse Mother    Arthritis Mother    Lung cancer Mother    Hypertension Mother    Mental illness Mother    Alcohol abuse Father  Heart disease Father    Hypertension Father    Diabetes Father    Colon cancer Father    Early death Sister    Arthritis Maternal Grandmother    Hypertension Maternal Grandmother    Breast cancer Maternal Grandmother    Stroke Maternal Grandfather    Hypertension Maternal Grandfather    Heart disease Paternal Grandmother    Hypertension Paternal Grandmother    Colon cancer Paternal Grandfather    Heart disease Paternal Grandfather    Hypertension Paternal Grandfather    Kidney disease Paternal Grandfather    Diabetes Paternal Grandfather    Stomach cancer Neg Hx     Esophageal cancer Neg Hx     Social History   Socioeconomic History   Marital status: Married    Spouse name: Not on file   Number of children: 2   Years of education: Not on file   Highest education level: Master's degree (e.g., MA, MS, MEng, MEd, MSW, MBA)  Occupational History   Occupation: Runner, broadcasting/film/video  Tobacco Use   Smoking status: Never   Smokeless tobacco: Never  Vaping Use   Vaping status: Never Used  Substance and Sexual Activity   Alcohol use: Not Currently    Comment: wine on occasion   Drug use: No   Sexual activity: Not Currently  Other Topics Concern   Not on file  Social History Narrative   Middle school English teacher, [employer]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   2 sons      Exercise-no   Social Determinants of Health   Financial Resource Strain: Medium Risk (03/17/2023)   Overall Financial Resource Strain (CARDIA)    Difficulty of Paying Living Expenses: Somewhat hard  Food Insecurity: No Food Insecurity (03/17/2023)   Hunger Vital Sign    Worried About Running Out of Food in the Last Year: Never true    Ran Out of Food in the Last Year: Never true  Transportation Needs: No Transportation Needs (03/17/2023)   PRAPARE - Administrator, Civil Service (Medical): No    Lack of Transportation (Non-Medical): No  Physical Activity: Insufficiently Active (03/17/2023)   Exercise Vital Sign    Days of Exercise per Week: 3 days    Minutes of Exercise per Session: 30 min  Stress: Stress Concern Present (03/17/2023)   [employer] of Occupational Health - Occupational Stress Questionnaire    Feeling of Stress : Rather much  Social Connections: Moderately Integrated (03/17/2023)   Social Connection and Isolation Panel [NHANES]    Frequency of Communication with Friends and Family: Once a week    Frequency of Social Gatherings with Friends and Family: Once a week    Attends Religious Services: 1 to 4 times per  year    Active Member of [employer] or Organizations: Yes    Attends Engineer, structural: More than 4 times per year    Marital Status: Married  Catering manager Violence: Not on file    Outpatient Medications Prior to Visit  Medication Sig Dispense Refill   albuterol (PROAIR HFA) 108 (90 Base) MCG/ACT inhaler Inhale 2 puffs into the lungs every 6 (six) hours as needed for wheezing or shortness of breath. 18 g 3   cyclobenzaprine (FLEXERIL) 10 MG tablet Take 1 tablet (10 mg total) by mouth 3 (three) times daily as needed for muscle spasms. 30 tablet 0   diazepam (VALIUM) 10 MG tablet Take 1 tablet (10 mg total) by  mouth every 12 (twelve) hours as needed for anxiety. 30 tablet 1   diclofenac sodium (VOLTAREN) 1 % GEL Apply 4 g topically 4 (four) times daily. 100 g 2   famotidine (PEPCID) 20 MG tablet Take 1 tablet (20 mg total) by mouth 2 (two) times daily. 60 tablet 3   FLUoxetine (PROZAC) 20 MG capsule Take 1 capsule (20 mg total) by mouth daily. 90 capsule 1   fluticasone (FLONASE) 50 MCG/ACT nasal spray Place 2 sprays into both nostrils daily. 16 g 6   ibuprofen (ADVIL) 800 MG tablet Take 800 mg by mouth 3 (three) times daily as needed.     naproxen (NAPROSYN) 500 MG tablet Take 1 tablet (500 mg total) by mouth 2 (two) times daily with a meal. 28 tablet 0   Olopatadine HCl (PATADAY) 0.2 % SOLN Place 1 drop into both eyes daily as needed. 2.5 mL 5   omeprazole (PRILOSEC) 40 MG capsule Take 1 capsule (40 mg total) by mouth daily. 30 capsule 3   FLUoxetine (PROZAC) 20 MG tablet Take 1 tablet (20 mg total) by mouth daily. 90 tablet 3   No facility-administered medications prior to visit.    Allergies  Allergen Reactions   Ambien Cr [Zolpidem Tartrate Er]     Seizure    Covid-19 Mrna Vacc (Moderna) Swelling   Cymbalta [Duloxetine Hcl]     Seizure    Celebrex [Celecoxib]     Hives    Hydrocodone Itching   Latex Rash    Adhesive   Mobic [Meloxicam] Palpitations    Review  of Systems  Constitutional:  Positive for malaise/fatigue and weight loss. Negative for fever.  HENT:  Positive for tinnitus. Negative for congestion.   Eyes:  Negative for blurred vision.  Respiratory:  Negative for cough and shortness of breath.   Cardiovascular:  Negative for chest pain, palpitations and leg swelling.  Gastrointestinal:  Negative for vomiting.  Musculoskeletal:  Positive for joint pain. Negative for back pain.  Skin:  Negative for rash.  Neurological:  Negative for loss of consciousness and headaches.       Objective:    Physical Exam Vitals and nursing note reviewed.  Neurological:     General: No focal deficit present.     Mental Status: She is oriented to person, place, and time.  Psychiatric:        Mood and Affect: Mood is anxious and depressed.        Behavior: Behavior normal.        Thought Content: Thought content normal.        Judgment: Judgment normal.     Comments: Pt is under a lot of stress  Her mother is in hospice / nursing home She broke her foot  She is still has tinnitus and feel s like it is worse       There were no vitals taken for this visit. Wt Readings from Last 3 Encounters:  05/28/23 195 lb (88.5 kg)  05/25/23 195 lb 9.6 oz (88.7 kg)  03/17/23 212 lb 6.4 oz (96.3 kg)       Assessment & Plan:  Depression with anxiety -     FLUoxetine HCl; Take 1 capsule (40 mg total) by mouth daily.  Dispense: 90 capsule; Refill: 3  Long COVID   Assessment and Plan    Post-Acute Sequelae of SARS-CoV-2 infection (PASC): Reports of persistent tinnitus and joint pain. -Continue current management and monitor symptoms.  Gastrointestinal Issues: Reports of esophageal discomfort  and feeling of a "knot" in the stomach despite esophageal stretching. Colonoscopy planned. -Continue follow-up with gastroenterologist.  Depression/Anxiety: Reports of ongoing stress and uncertainty about the efficacy of current Fluoxetine regimen. -Increase  Fluoxetine to 40mg  daily. Monitor for increased tinnitus or other side effects.  Hematuria and Proteinuria: Undergoing workup with urology, including bladder ablation and upcoming CT scan. -Continue follow-up with urology.  Fractured Toe: Currently managed with a boot, limiting mobility. -Continue follow-up with orthopedics.  Work-Related Stress: Concerns about returning to work in current health state and potential need for accommodations or early retirement. -Extend FMLA leave until October 12, 2023. -Consider discussing potential accommodations with employer upon return to work.  General Health Maintenance: -Continue to monitor weight loss and dietary changes. -Consider counseling or other mental health support as needed.        I discussed the assessment and treatment plan with the patient. The patient was provided an opportunity to ask questions and all were answered. The patient agreed with the plan and demonstrated an understanding of the instructions.   The patient was advised to call back or seek an in-person evaluation if the symptoms worsen or if the condition fails to improve as anticipated.  Donato Schultz, DO [facility] [phone] (phone) [phone] (fax)  [facility]

## 2023-06-29 ENCOUNTER — Ambulatory Visit: Payer: BC Managed Care – PPO | Admitting: Family Medicine

## 2023-06-29 ENCOUNTER — Encounter: Payer: Self-pay | Admitting: Family Medicine

## 2023-06-29 VITALS — BP 118/80 | HR 82 | Ht 70.0 in | Wt 195.0 lb

## 2023-06-29 DIAGNOSIS — M79671 Pain in right foot: Secondary | ICD-10-CM | POA: Diagnosis not present

## 2023-06-29 DIAGNOSIS — M25561 Pain in right knee: Secondary | ICD-10-CM

## 2023-06-29 MED ORDER — DICLOFENAC SODIUM 75 MG PO TBEC: 75.0000 mg | DELAYED_RELEASE_TABLET | Freq: Two times a day (BID) | ORAL | 1 refills | Status: AC

## 2023-06-29 NOTE — Patient Instructions (Addendum)
Your fracture has healed. You can use the boot only if needed for the flare of midfoot arthritis you have. Icing 15 minutes at a time as needed. Elevate above your heart as needed for swelling. Diclofenac 75mg  twice a day with food as needed for pain and inflammation. Follow up with me in 1 month.  Don't take aleve/naprosyn, ibuprofen while on this. If not continuing to improve we can consider a steroid injection - these are not quite as effective as they are for other joints.

## 2023-06-30 ENCOUNTER — Encounter: Payer: Self-pay | Admitting: Family Medicine

## 2023-06-30 NOTE — Progress Notes (Signed)
PCP: Donato Schultz, DO  Subjective:   HPI: Patient is a 57 y.o. female here for right foot and knee pain.  6/20: Patient reports about 2 weeks ago she was visiting her mother in the hospital when she accidentally kicked the edge of the hospital bed. Immediate pain, swelling lateral foot. This has persisted and radiated more proximally to anterior right ankle. Had x-rays that were negative. Worse with ambulation. Also reports posterior right knee pain. Had a doppler ultrasound negative for DVT but found baker's cyst. She has known arthritis.  7/22: Patient reports outside of foot is much better but having pain on top of her right foot. Associated swelling as well. No new injuries. Knee pain is improved following injection.  Past Medical History:  Diagnosis Date   Anxiety    Arrhythmia    Arthritis    Depression    Eating disorder    Fibromyalgia    GERD (gastroesophageal reflux disease)    IBS (irritable bowel syndrome)    Migraine    Seizures (HCC)    last 2008    Current Outpatient Medications on File Prior to Visit  Medication Sig Dispense Refill   albuterol (PROAIR HFA) 108 (90 Base) MCG/ACT inhaler Inhale 2 puffs into the lungs every 6 (six) hours as needed for wheezing or shortness of breath. 18 g 3   cyclobenzaprine (FLEXERIL) 10 MG tablet Take 1 tablet (10 mg total) by mouth 3 (three) times daily as needed for muscle spasms. 30 tablet 0   diazepam (VALIUM) 10 MG tablet Take 1 tablet (10 mg total) by mouth every 12 (twelve) hours as needed for anxiety. 30 tablet 1   famotidine (PEPCID) 20 MG tablet Take 1 tablet (20 mg total) by mouth 2 (two) times daily. 60 tablet 3   FLUoxetine (PROZAC) 20 MG capsule Take 1 capsule (20 mg total) by mouth daily. 90 capsule 1   FLUoxetine (PROZAC) 40 MG capsule Take 1 capsule (40 mg total) by mouth daily. 90 capsule 3   fluticasone (FLONASE) 50 MCG/ACT nasal spray Place 2 sprays into both nostrils daily. 16 g 6   ibuprofen  (ADVIL) 800 MG tablet Take 800 mg by mouth 3 (three) times daily as needed.     Olopatadine HCl (PATADAY) 0.2 % SOLN Place 1 drop into both eyes daily as needed. 2.5 mL 5   omeprazole (PRILOSEC) 40 MG capsule Take 1 capsule (40 mg total) by mouth daily. 30 capsule 3   No current facility-administered medications on file prior to visit.    Past Surgical History:  Procedure Laterality Date   COLONOSCOPY Bilateral 2014   myomectomy Bilateral 04/2021    Allergies  Allergen Reactions   Ambien Cr [Zolpidem Tartrate Er]     Seizure    Covid-19 Mrna Vacc (Moderna) Swelling   Cymbalta [Duloxetine Hcl]     Seizure    Celebrex [Celecoxib]     Hives    Hydrocodone Itching   Latex Rash    Adhesive   Mobic [Meloxicam] Palpitations    BP 118/80 (BP Location: Left Arm, Patient Position: Sitting)   Pulse 82   Ht 5\' 10"  (1.778 m)   Wt 195 lb (88.5 kg)   SpO2 97%   BMI 27.98 kg/m       No data to display              No data to display              Objective:  Physical Exam:  Gen: NAD, comfortable in exam room  Right foot/ankle: Mild swelling over TMT joint dorsally.  No lateral foot swelling.  No bruising, other deformity. FROM TTP over TMT joint about 2nd-3rd TMT area.  No 5th metatarsal tenderness. Negative  ant drawer and negative talar tilt.   Thompsons test negative. NV intact distally.   Limited MSK u/s right foot:  5th metatarsal irregularity resolved - fracture healed.  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  Assessment & Plan:  1. Right foot injury - 5th metatarsal fracture healed.  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  Icing, diclofenac.  Her allergy to meloxicam was palpitations - tolerates other nsaids.  Consider using boot if needed.  Consider injection if not improving.  2. Right knee pain - improved following intraarticular injection.

## 2023-07-07 ENCOUNTER — Encounter: Payer: Self-pay | Admitting: Urology

## 2023-07-07 DIAGNOSIS — R3129 Other microscopic hematuria: Secondary | ICD-10-CM

## 2023-07-08 ENCOUNTER — Other Ambulatory Visit: Payer: Self-pay | Admitting: Urology

## 2023-07-08 DIAGNOSIS — R3129 Other microscopic hematuria: Secondary | ICD-10-CM

## 2023-07-21 ENCOUNTER — Ambulatory Visit
Admission: RE | Admit: 2023-07-21 | Discharge: 2023-07-21 | Disposition: A | Discharge status: Home / Self Care | Payer: BC Managed Care – PPO | Source: Ambulatory Visit | Attending: Urology | Admitting: Urology

## 2023-07-21 DIAGNOSIS — R3129 Other microscopic hematuria: Secondary | ICD-10-CM

## 2023-08-07 ENCOUNTER — Other Ambulatory Visit: Payer: Self-pay | Admitting: Family Medicine

## 2023-08-07 DIAGNOSIS — F418 Other specified anxiety disorders: Secondary | ICD-10-CM

## 2023-08-26 ENCOUNTER — Ambulatory Visit (INDEPENDENT_AMBULATORY_CARE_PROVIDER_SITE_OTHER): Payer: BC Managed Care – PPO | Admitting: Gastroenterology

## 2023-08-26 ENCOUNTER — Encounter: Payer: Self-pay | Admitting: Gastroenterology

## 2023-08-26 VITALS — BP 100/70 | HR 68 | Ht 68.25 in | Wt 193.5 lb

## 2023-08-26 DIAGNOSIS — R1310 Dysphagia, unspecified: Secondary | ICD-10-CM

## 2023-08-26 DIAGNOSIS — Z800 Family history of malignant neoplasm of digestive organs: Secondary | ICD-10-CM | POA: Diagnosis not present

## 2023-08-26 DIAGNOSIS — R0789 Other chest pain: Secondary | ICD-10-CM

## 2023-08-26 DIAGNOSIS — K219 Gastro-esophageal reflux disease without esophagitis: Secondary | ICD-10-CM

## 2023-08-26 NOTE — Progress Notes (Signed)
08/26/2023 Suzanne Singh [id number] 1966/01/06   HISTORY OF PRESENT ILLNESS: This is a 57 year old female who is a patient of Dr. Marvell Fuller.  She is here today to discuss and schedule colonoscopy.  Her last colonoscopy was in 2014 at which time the study was normal.  She tells me that her father had colon cancer and now her mother was recently diagnosed with colon cancer as well.  Anyway, she is also complaining of intermittent epigastric abdominal pain and reflux with comfort in her chest.  Having regurgitation after she eats.  She has both famotidine 20 mg twice daily and omeprazole 40 mg daily on her list, but she is not taking either one of those.  Says that she was trying change her diet and take a more natural approach.  Had EGD last year with empiric dilation of her esophagus.  Esophageal biopsies normal.   Past Medical History:  Diagnosis Date   Anxiety    Arrhythmia    Arthritis    Depression    Eating disorder    Fibromyalgia    GERD (gastroesophageal reflux disease)    IBS (irritable bowel syndrome)    Migraine    Seizures (HCC)    last 2008   Past Surgical History:  Procedure Laterality Date   COLONOSCOPY Bilateral 2014   myomectomy Bilateral 04/2021    reports that she has never smoked. She has never used smokeless tobacco. She reports that she does not currently use alcohol. She reports that she does not use drugs. family history includes Alcohol abuse in her father and mother; Arthritis in her maternal grandmother and mother; Breast cancer in her maternal grandmother; Colon cancer in her father and paternal grandfather; Diabetes in her father and paternal grandfather; Early death in her sister; Heart disease in her father, paternal grandfather, and paternal grandmother; Hypertension in her father, maternal grandfather, maternal grandmother, mother, paternal grandfather, and paternal grandmother; Kidney disease in her paternal grandfather; Lung cancer in her mother;  Mental illness in her mother; Stroke in her maternal grandfather. Allergies  Allergen Reactions   Ambien Cr [Zolpidem Tartrate Er]     Seizure    Covid-19 Mrna Vacc (Moderna) Swelling   Cymbalta [Duloxetine Hcl]     Seizure    Celebrex [Celecoxib]     Hives    Hydrocodone Itching   Latex Rash    Adhesive   Mobic [Meloxicam] Palpitations      Outpatient Encounter Medications as of 08/26/2023  Medication Sig   albuterol (PROAIR HFA) 108 (90 Base) MCG/ACT inhaler Inhale 2 puffs into the lungs every 6 (six) hours as needed for wheezing or shortness of breath.   APPLE CIDER VINEGAR PO Take 2 tablets by mouth daily.   Barberry-Oreg Grape-Goldenseal (BERBERINE COMPLEX PO) Take 1 capsule by mouth daily.   BIOTIN PO Take 1 tablet by mouth daily.   Coenzyme Q10 (CO Q 10 PO) Take 1 Dose by mouth daily.   COLLAGEN PO Take 1 tablet by mouth daily.   CRANBERRY PO Take 1 tablet by mouth daily.   cyclobenzaprine (FLEXERIL) 10 MG tablet Take 1 tablet (10 mg total) by mouth 3 (three) times daily as needed for muscle spasms.   diazepam (VALIUM) 10 MG tablet Take 1 tablet (10 mg total) by mouth every 12 (twelve) hours as needed for anxiety.   diclofenac (VOLTAREN) 75 MG EC tablet Take 1 tablet (75 mg total) by mouth 2 (two) times daily.   famotidine (PEPCID) 20 MG tablet Take  1 tablet (20 mg total) by mouth 2 (two) times daily.   FLUoxetine (PROZAC) 40 MG capsule Take 1 capsule (40 mg total) by mouth daily.   fluticasone (FLONASE) 50 MCG/ACT nasal spray Place 2 sprays into both nostrils daily.   ibuprofen (ADVIL) 800 MG tablet Take 800 mg by mouth 3 (three) times daily as needed.   Multiple Vitamins-Minerals (MULTIVITAMIN GUMMIES WOMENS PO) Take 2 tablets by mouth daily.   Olopatadine HCl (PATADAY) 0.2 % SOLN Place 1 drop into both eyes daily as needed.   omeprazole (PRILOSEC) 40 MG capsule Take 1 capsule (40 mg total) by mouth daily.   Probiotic Product (PROBIOTIC PO) Take 1 capsule by mouth  daily.   FLUoxetine (PROZAC) 20 MG capsule Take 1 capsule (20 mg total) by mouth daily. (Patient not taking: Reported on 08/26/2023)   No facility-administered encounter medications on file as of 08/26/2023.    REVIEW OF SYSTEMS  : All other systems reviewed and negative except where noted in the History of Present Illness.   PHYSICAL EXAM: BP 100/70 (BP Location: Left Arm, Patient Position: Sitting, Cuff Size: Large)   Pulse 68   Ht 5' 8.25" (1.734 m) Comment: height measured without shoes  Wt 193 lb 8 oz (87.8 kg)   BMI 29.21 kg/m  General: Well developed female in no acute distress Head: Normocephalic and atraumatic Eyes:  Sclerae anicteric, conjunctiva pink. Ears: Normal auditory acuity Lungs: Clear throughout to auscultation; no W/R/R. Heart: Regular rate and rhythm; no M/R/G. Abdomen: Soft, non-distended.  BS present.  Mild epigastric TTP. Rectal:  Will be done at the time of colonoscopy. Musculoskeletal: Symmetrical with no gross deformities  Skin: No lesions on visible extremities Extremities: No edema  Neurological: Alert oriented x 4, grossly non-focal Psychological:  Alert and cooperative. Normal mood and affect  ASSESSMENT AND PLAN: *Family history of colon cancer: She states that both her father and then recently her mother had colon cancer.  Last colonoscopy was 10 years ago.  Will schedule Dr. Leone Payor.  The risks, benefits, and alternatives to colonoscopy were discussed with the patient and she consents to proceed.  *GERD, dysphagia, atypical chest pain.  Just  had an EGD last year.  Esophagus was empirically dilated although no abnormality seen.  Having similar complaints, but has not been taking either of her medication as she was trying to change her diet and take a more natural approach.  She has famotidine at home so we will start with famotidine 20 mg twice daily.  She will do this for couple weeks, but if no improvement she can will call back and we can renew  prescription for omeprazole 40 mg daily.  Will check an upper GI study as well for reassurance.   CC:  Donato Schultz, *

## 2023-08-26 NOTE — Patient Instructions (Signed)
Restart Famotidine 20 mg twice daily, send message Mychart if not working in 2 weeks.  You have been scheduled for an Upper GI Series at [facility]. Your appointment is on Friday 09/04/23 at 9 am. Please arrive 30 minutes prior to your test for registration. Make sure not to eat or drink anything after midnight on the night before your test. If you need to reschedule, please call radiology at [phone]. ________________________________________________________________ An upper GI series uses x rays to help diagnose problems of the upper GI tract, which includes the esophagus, stomach, and duodenum. The duodenum is the first part of the small intestine. An upper GI series is conducted by a radiology technologist or a radiologist--a doctor who specializes in x-ray imaging--at a hospital or outpatient center. While sitting or standing in front of an x-ray machine, the patient drinks barium liquid, which is often white and has a chalky consistency and taste. The barium liquid coats the lining of the upper GI tract and makes signs of disease show up more clearly on x rays. X-ray video, called fluoroscopy, is used to view the barium liquid moving through the esophagus, stomach, and duodenum. Additional x rays and fluoroscopy are performed while the patient lies on an x-ray table. To fully coat the upper GI tract with barium liquid, the technologist or radiologist may press on the abdomen or ask the patient to change position. Patients hold still in various positions, allowing the technologist or radiologist to take x rays of the upper GI tract at different angles. If a technologist conducts the upper GI series, a radiologist will later examine the images to look for problems.  This test typically takes about 1 hour to complete. __________________________________________________________________  Suzanne Singh have been scheduled for a colonoscopy. Please follow written instructions given to you at your visit  today.   Please pick up your prep supplies at the pharmacy within the next 1-3 days.  If you use inhalers (even only as needed), please bring them with you on the day of your procedure.  DO NOT TAKE 7 DAYS PRIOR TO TEST- Trulicity (dulaglutide) Ozempic, Wegovy (semaglutide) Mounjaro (tirzepatide) Bydureon Bcise (exanatide extended release)  DO NOT TAKE 1 DAY PRIOR TO YOUR TEST Rybelsus (semaglutide) Adlyxin (lixisenatide) Victoza (liraglutide) Byetta (exanatide) _________________________________________________________________  _______________________________________________________  If your blood pressure at your visit was 140/90 or greater, please contact your primary care physician to follow up on this.  _______________________________________________________  If you are age 49 or older, your body mass index should be between 23-30. Your Body mass index is 29.21 kg/m. If this is out of the aforementioned range listed, please consider follow up with your Primary Care Provider.  If you are age 53 or younger, your body mass index should be between 19-25. Your Body mass index is 29.21 kg/m. If this is out of the aformentioned range listed, please consider follow up with your Primary Care Provider.   ________________________________________________________  The  GI providers would like to encourage you to use [facility] to communicate with providers for non-urgent requests or questions.  Due to long hold times on the telephone, sending your provider a message by [facility] may be a faster and more efficient way to get a response.  Please allow 48 business hours for a response.  Please remember that this is for non-urgent requests.  _______________________________________________________

## 2023-09-04 ENCOUNTER — Other Ambulatory Visit: Payer: Self-pay | Admitting: Gastroenterology

## 2023-09-04 ENCOUNTER — Ambulatory Visit (HOSPITAL_COMMUNITY)
Admission: RE | Admit: 2023-09-04 | Discharge: 2023-09-04 | Disposition: A | Discharge status: Home / Self Care | Payer: BC Managed Care – PPO | Source: Ambulatory Visit | Attending: Gastroenterology | Admitting: Gastroenterology

## 2023-09-04 DIAGNOSIS — R0789 Other chest pain: Secondary | ICD-10-CM | POA: Insufficient documentation

## 2023-09-04 DIAGNOSIS — R1310 Dysphagia, unspecified: Secondary | ICD-10-CM | POA: Insufficient documentation

## 2023-09-04 DIAGNOSIS — K219 Gastro-esophageal reflux disease without esophagitis: Secondary | ICD-10-CM

## 2023-09-04 DIAGNOSIS — Z800 Family history of malignant neoplasm of digestive organs: Secondary | ICD-10-CM

## 2023-09-07 DIAGNOSIS — Z860101 Personal history of adenomatous and serrated colon polyps: Secondary | ICD-10-CM | POA: Insufficient documentation

## 2023-09-07 HISTORY — DX: Personal history of adenomatous and serrated colon polyps: Z86.0101

## 2023-09-09 ENCOUNTER — Ambulatory Visit (AMBULATORY_SURGERY_CENTER): Payer: BC Managed Care – PPO | Admitting: Internal Medicine

## 2023-09-09 ENCOUNTER — Encounter: Payer: Self-pay | Admitting: Internal Medicine

## 2023-09-09 VITALS — BP 121/75 | HR 62 | Temp 97.9°F | Resp 10 | Ht 68.0 in | Wt 193.0 lb

## 2023-09-09 DIAGNOSIS — Z800 Family history of malignant neoplasm of digestive organs: Secondary | ICD-10-CM

## 2023-09-09 DIAGNOSIS — D124 Benign neoplasm of descending colon: Secondary | ICD-10-CM

## 2023-09-09 DIAGNOSIS — Z1211 Encounter for screening for malignant neoplasm of colon: Secondary | ICD-10-CM

## 2023-09-09 MED ORDER — SODIUM CHLORIDE 0.9 % IV SOLN: 500.0000 mL | INTRAVENOUS | Status: DC

## 2023-09-09 NOTE — Progress Notes (Signed)
Sedate, gd SR, tolerated procedure well, VSS, report to RN 

## 2023-09-09 NOTE — Op Note (Signed)
[facility] Patient Name: Suzanne Singh Procedure Date: 09/09/2023 1:47 PM MRN: [id number] Endoscopist: Iva Boop , MD, [id number] Age: 57 Referring MD:  Date of Birth: 22-Oct-1966 Gender: Female Account #: [id number] Procedure:                Colonoscopy Indications:              Screening patient at increased risk: Family history                            of colorectal cancer in multiple 1st-degree                            relatives Mother and father Medicines:                Monitored Anesthesia Care Procedure:                Pre-Anesthesia Assessment:                           - Prior to the procedure, a History and Physical                            was performed, and patient medications and                            allergies were reviewed. The patient's tolerance of                            previous anesthesia was also reviewed. The risks                            and benefits of the procedure and the sedation                            options and risks were discussed with the patient.                            All questions were answered, and informed consent                            was obtained. Prior Anticoagulants: The patient has                            taken no anticoagulant or antiplatelet agents. ASA                            Grade Assessment: II - A patient with mild systemic                            disease. After reviewing the risks and benefits,                            the patient was deemed in satisfactory condition to  undergo the procedure.                           After obtaining informed consent, the colonoscope                            was passed under direct vision. Throughout the                            procedure, the patient's blood pressure, pulse, and                            oxygen saturations were monitored continuously. The                            CF [id number] #[id number] was introduced  through the anus                            and advanced to the the cecum, identified by                            appendiceal orifice and ileocecal valve. The                            colonoscopy was performed without difficulty. The                            patient tolerated the procedure well. The quality                            of the bowel preparation was good. The ileocecal                            valve, appendiceal orifice, and rectum were                            photographed. Scope In: 2:09:58 PM Scope Out: 2:27:03 PM Scope Withdrawal Time: 0 hours 9 minutes 51 seconds  Total Procedure Duration: 0 hours 17 minutes 5 seconds  Findings:                 The perianal and digital rectal examinations were                            normal.                           A diminutive polyp was found in the descending                            colon. The polyp was sessile. The polyp was removed                            with a cold snare. Resection and retrieval were  complete. Verification of patient identification                            for the specimen was done. Estimated blood loss was                            minimal.                           Multiple diverticula were found in the transverse                            colon and ascending colon.                           Internal hemorrhoids were found.                           The exam was otherwise without abnormality on                            direct and retroflexion views. Complications:            No immediate complications. Estimated Blood Loss:     Estimated blood loss was minimal. Impression:               - One diminutive polyp in the descending colon,                            removed with a cold snare. Resected and retrieved.                           - Diverticulosis in the transverse colon and in the                            ascending colon.                           -  Internal hemorrhoids.                           - The examination was otherwise normal on direct                            and retroflexion views. FHx CRCA mother and father Recommendation:           - Patient has a contact number available for                            emergencies. The signs and symptoms of potential                            delayed complications were discussed with the                            patient. Return to normal activities tomorrow.  Written discharge instructions were provided to the                            patient.                           - Resume previous diet.                           - Continue present medications.                           - Await pathology results.                           - Repeat colonoscopy in 5 years. Iva Boop, MD 09/09/2023 2:41:21 PM This report has been signed electronically.

## 2023-09-09 NOTE — Progress Notes (Signed)
History and Physical Interval Note:  09/09/2023 2:03 PM  Suzanne Singh  has presented today for endoscopic procedure(s), with the diagnosis of  Encounter Diagnosis  Name Primary?   Family history of colon cancer Yes  .  The various methods of evaluation and treatment have been discussed with the patient and/or family. After consideration of risks, benefits and other options for treatment, the patient has consented to  the endoscopic procedure(s).   The patient's history has been reviewed, patient examined, no change in status, stable for endoscopic procedure(s).  I have reviewed the patient's chart and labs.  Questions were answered to the patient's satisfaction.     Iva Boop, MD, [facility]

## 2023-09-09 NOTE — Patient Instructions (Addendum)
I removed one tiny polyp. Anticipate recommending repeat colonoscopy in 5 years.  I appreciate the opportunity to care for you. Iva Boop, MD, [facility]  Handouts provided on polyps, diverticulosis and hemorrhoids.  Resume previous diet.  Continue present medications.  Await pathology results.  Repeat colonoscopy in 5 years.   YOU HAD AN ENDOSCOPIC PROCEDURE TODAY AT THE [facility]:   Refer to the procedure report that was given to you for any specific questions about what was found during the examination.  If the procedure report does not answer your questions, please call your gastroenterologist to clarify.  If you requested that your care partner not be given the details of your procedure findings, then the procedure report has been included in a sealed envelope for you to review at your convenience later.  YOU SHOULD EXPECT: Some feelings of bloating in the abdomen. Passage of more gas than usual.  Walking can help get rid of the air that was put into your GI tract during the procedure and reduce the bloating. If you had a lower endoscopy (such as a colonoscopy or flexible sigmoidoscopy) you may notice spotting of blood in your stool or on the toilet paper. If you underwent a bowel prep for your procedure, you may not have a normal bowel movement for a few days.  Please Note:  You might notice some irritation and congestion in your nose or some drainage.  This is from the oxygen used during your procedure.  There is no need for concern and it should clear up in a day or so.  SYMPTOMS TO REPORT IMMEDIATELY:  Following lower endoscopy (colonoscopy or flexible sigmoidoscopy):  Excessive amounts of blood in the stool  Significant tenderness or worsening of abdominal pains  Swelling of the abdomen that is new, acute  Fever of 100F or higher  For urgent or emergent issues, a gastroenterologist can be reached at any hour by calling [phone]. Do not use MyChart messaging  for urgent concerns.    DIET:  We do recommend a small meal at first, but then you may proceed to your regular diet.  Drink plenty of fluids but you should avoid alcoholic beverages for 24 hours.  ACTIVITY:  You should plan to take it easy for the rest of today and you should NOT DRIVE or use heavy machinery until tomorrow (because of the sedation medicines used during the test).    FOLLOW UP: Our staff will call the number listed on your records the next business day following your procedure.  We will call around 7:15- 8:00 am to check on you and address any questions or concerns that you may have regarding the information given to you following your procedure. If we do not reach you, we will leave a message.     If any biopsies were taken you will be contacted by phone or by letter within the next 1-3 weeks.  Please call us at [phone] if you have not heard about the biopsies in 3 weeks.    SIGNATURES/CONFIDENTIALITY: You and/or your care partner have signed paperwork which will be entered into your electronic medical record.  These signatures attest to the fact that that the information above on your After Visit Summary has been reviewed and is understood.  Full responsibility of the confidentiality of this discharge information lies with you and/or your care-partner.

## 2023-09-09 NOTE — Progress Notes (Signed)
Called to room to assist during endoscopic procedure.  Patient ID and intended procedure confirmed with present staff. Received instructions for my participation in the procedure from the performing physician.  

## 2023-09-09 NOTE — Progress Notes (Signed)
Pt's states no medical or surgical changes since previsit or office visit. 

## 2023-09-10 ENCOUNTER — Telehealth: Payer: Self-pay

## 2023-09-10 NOTE — Telephone Encounter (Signed)
  Follow up Call-     09/09/2023    1:06 PM 07/09/2022    9:02 AM  Call back number  Post procedure Call Back phone  # [phone]  Permission to leave phone message Yes Yes     Patient questions:  Do you have a fever, pain , or abdominal swelling? No. Pain Score  0 *  Have you tolerated food without any problems? Yes.    Have you been able to return to your normal activities? Yes.    Do you have any questions about your discharge instructions: Diet   No. Medications  No. Follow up visit  No.  Do you have questions or concerns about your Care? No.  Actions: * If pain score is 4 or above: No action needed, pain <4.

## 2023-09-11 LAB — SURGICAL PATHOLOGY

## 2023-09-14 ENCOUNTER — Encounter: Payer: Self-pay | Admitting: Internal Medicine

## 2023-09-25 ENCOUNTER — Ambulatory Visit (INDEPENDENT_AMBULATORY_CARE_PROVIDER_SITE_OTHER): Payer: BC Managed Care – PPO | Admitting: Family Medicine

## 2023-09-25 VITALS — BP 110/90 | HR 89 | Temp 98.9°F | Resp 18 | Ht 68.0 in | Wt 189.4 lb

## 2023-09-25 DIAGNOSIS — H9313 Tinnitus, bilateral: Secondary | ICD-10-CM

## 2023-09-25 DIAGNOSIS — M67442 Ganglion, left hand: Secondary | ICD-10-CM

## 2023-09-25 DIAGNOSIS — R1310 Dysphagia, unspecified: Secondary | ICD-10-CM | POA: Diagnosis not present

## 2023-09-25 DIAGNOSIS — F32A Depression, unspecified: Secondary | ICD-10-CM

## 2023-09-25 DIAGNOSIS — U099 Post covid-19 condition, unspecified: Secondary | ICD-10-CM

## 2023-09-25 DIAGNOSIS — D509 Iron deficiency anemia, unspecified: Secondary | ICD-10-CM

## 2023-09-25 DIAGNOSIS — F419 Anxiety disorder, unspecified: Secondary | ICD-10-CM

## 2023-09-25 MED ORDER — PANTOPRAZOLE SODIUM 40 MG PO TBEC
40.0000 mg | DELAYED_RELEASE_TABLET | Freq: Every day | ORAL | 3 refills | Status: AC
Start: 2023-09-25 — End: ?

## 2023-09-25 MED ORDER — FLUOXETINE HCL 10 MG PO CAPS
ORAL_CAPSULE | ORAL | 3 refills | Status: AC
Start: 2023-09-25 — End: ?

## 2023-09-25 NOTE — Progress Notes (Signed)
Established Patient Office Visit  Subjective   Patient ID: Suzanne Singh, female    DOB: 11/08/1966  Age: 57 y.o. MRN: [id number]  Chief Complaint  Patient presents with   Follow-up    HPI Discussed the use of AI scribe software for clinical note transcription with the patient, who gave verbal consent to proceed.  History of Present Illness   The patient, with a history of urinary problems, depression, difficulty swallowing, tinnitus, arthritis, and loss of appetite, presents with a general feeling of unwellness and inability to return to work. She expresses concern about her current health status and the impact it is having on her life, including the potential need for early retirement and the fear of losing her insurance.  The patient reports ongoing urinary issues, for which she is currently seeing a urologist and has recently had a CT scan. She has been prescribed Reditrex, which has provided some relief from the urgency. However, she continues to feel unwell and has noticed an increase in lethargy since increasing her dosage of Prozac to 40mg . She reports being in bed by 6pm but not sleeping well.  The patient also reports difficulty swallowing and a feeling of something sitting on her chest. She has recently had a procedure to stretch her esophagus and is currently taking famotidine and omeprazole, but does not feel these are helping. She also reports a constant ringing in her ears, which she believes is a result of having had COVID-19.  The patient also reports arthritis flare-ups and a knot in her hand that is causing pain. She has lost 20 pounds without trying due to a lack of appetite, which she is unsure if it is related to menopause or her other health issues.  The patient expresses feelings of depression due to her ongoing health issues and the impact they are having on her life. She is unsure of what steps to take next and is seeking guidance from her doctor.      Patient  Active Problem List   Diagnosis Date Noted   Hx of adenomatous polyp of colon 09/07/2023   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   Acute right ankle pain 05/25/2023   Right foot pain 05/25/2023   Right calf pain 05/25/2023   Acute left-sided low back pain with left-sided sciatica 11/28/2022   Congenital single kidney 06/16/2022   Atypical chest pain 03/17/2022   Dysphagia 03/17/2022   Whiplash injury to neck 10/03/2021   DDD (degenerative disc disease), cervical 03/21/2021   Palpitations 10/04/2020   Tachycardia 06/14/2020   Shortness of breath on exertion 06/14/2020   Memory loss 06/14/2020   Brain fog 06/14/2020   Benign paroxysmal positional vertigo due to bilateral vestibular disorder 06/14/2020   Tinnitus of both ears 06/14/2020   Physical deconditioning 06/14/2020   Essential hypertension 05/16/2020   Stress headache 05/16/2020   SOB (shortness of breath) 05/16/2020   Immunization reaction 05/16/2020   Neck pain on right side 03/31/2018   Viral URI with cough 04/25/2017   Abscess 12/27/2016   Left anterior shoulder pain 06/28/2015   Skin infection 01/12/2015   Breast pain 01/12/2015   UTI (urinary tract infection) 11/16/2014   Weight gain 11/16/2014   Bilateral knee pain 09/21/2014   Thyromegaly 09/21/2014   Obesity (BMI 30-39.9) 06/21/2014   Edema 04/07/2014   Maxillary sinusitis 11/07/2013   Elevated BP 11/07/2013   Insomnia 07/28/2013   Epigastric pain 12/06/2012   Menopause 12/06/2012   IBS (irritable bowel syndrome) 12/06/2012   Fatigue 12/06/2012  Family hx of colon cancer in grandparent 12/06/2012   Screening for malignant neoplasm of cervix 07/22/2012   Preventative health care 07/22/2012   Depression with anxiety 07/22/2012   Eating disorder 07/22/2012   GERD (gastroesophageal reflux disease) 07/22/2012   Fibromyalgia 07/22/2012   Past Medical History:  Diagnosis Date   Anxiety    Arrhythmia    Arthritis    Depression    Eating disorder    Fibromyalgia     GERD (gastroesophageal reflux disease)    Hx of adenomatous polyp of colon 09/07/2023   Diminutive - on recall 2029 (also has Fhx CRCA)    IBS (irritable bowel syndrome)    Migraine    Seizures (HCC)    last 2008   Past Surgical History:  Procedure Laterality Date   COLONOSCOPY Bilateral 2014   myomectomy Bilateral 04/2021   Social History   Tobacco Use   Smoking status: Never   Smokeless tobacco: Never  Vaping Use   Vaping status: Never Used  Substance Use Topics   Alcohol use: Not Currently    Comment: wine on occasion   Drug use: No   Social History   Socioeconomic History   Marital status: Married    Spouse name: Not on file   Number of children: 2   Years of education: Not on file   Highest education level: Master's degree (e.g., MA, MS, MEng, MEd, MSW, MBA)  Occupational History   Occupation: Runner, broadcasting/film/video  Tobacco Use   Smoking status: Never   Smokeless tobacco: Never  Vaping Use   Vaping status: Never Used  Substance and Sexual Activity   Alcohol use: Not Currently    Comment: wine on occasion   Drug use: No   Sexual activity: Not Currently  Other Topics Concern   Not on file  Social History Narrative   Middle school English teacher, [employer]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   2 sons      Exercise-no   Social Determinants of Health   Financial Resource Strain: Medium Risk (09/25/2023)   Overall Financial Resource Strain (CARDIA)    Difficulty of Paying Living Expenses: Somewhat hard  Food Insecurity: No Food Insecurity (09/25/2023)   Hunger Vital Sign    Worried About Running Out of Food in the Last Year: Never true    Ran Out of Food in the Last Year: Never true  Transportation Needs: No Transportation Needs (09/25/2023)   PRAPARE - Administrator, Civil Service (Medical): No    Lack of Transportation (Non-Medical): No  Physical Activity: Insufficiently Active (09/25/2023)   Exercise  Vital Sign    Days of Exercise per Week: 2 days    Minutes of Exercise per Session: 10 min  Stress: Stress Concern Present (09/25/2023)   [employer] of Occupational Health - Occupational Stress Questionnaire    Feeling of Stress : Very much  Social Connections: Moderately Integrated (09/25/2023)   Social Connection and Isolation Panel [NHANES]    Frequency of Communication with Friends and Family: Twice a week    Frequency of Social Gatherings with Friends and Family: Never    Attends Religious Services: More than 4 times per year    Active Member of [employer] or Organizations: Yes    Attends Engineer, structural: More than 4 times per year    Marital Status: Married  Catering manager Violence: Not on file   Family Status  Relation Name Status   Mother  (  Not Specified)   Father  (Not Specified)   Sister half Deceased   Mat Aunt  (Not Specified)   MGM  (Not Specified)   MGF  (Not Specified)   PGM  (Not Specified)   PGF  (Not Specified)   Neg Hx  (Not Specified)  No partnership data on file   Family History  Problem Relation Age of Onset   Colon cancer Mother    Alcohol abuse Mother    Arthritis Mother    Lung cancer Mother    Hypertension Mother    Mental illness Mother    Alcohol abuse Father    Heart disease Father    Hypertension Father    Diabetes Father    Colon cancer Father    Early death Sister    Colon cancer Maternal Aunt    Arthritis Maternal Grandmother    Hypertension Maternal Grandmother    Breast cancer Maternal Grandmother    Stroke Maternal Grandfather    Hypertension Maternal Grandfather    Heart disease Paternal Grandmother    Hypertension Paternal Grandmother    Colon cancer Paternal Grandfather    Heart disease Paternal Grandfather    Hypertension Paternal Grandfather    Kidney disease Paternal Grandfather    Diabetes Paternal Grandfather    Stomach cancer Neg Hx    Esophageal cancer Neg Hx    Allergies  Allergen Reactions    Ambien Cr [Zolpidem Tartrate Er]     Seizure    Covid-19 Mrna Vacc (Moderna) Swelling   Cymbalta [Duloxetine Hcl]     Seizure    Celebrex [Celecoxib]     Hives    Hydrocodone Itching   Latex Rash    Adhesive   Mobic [Meloxicam] Palpitations      Review of Systems  Constitutional:  Positive for malaise/fatigue and weight loss. Negative for chills and fever.  HENT:  Positive for tinnitus. Negative for congestion and hearing loss.   Eyes:  Negative for blurred vision and discharge.  Respiratory:  Negative for cough, sputum production and shortness of breath.   Cardiovascular:  Negative for chest pain, palpitations and leg swelling.  Gastrointestinal:  Negative for abdominal pain, blood in stool, constipation, diarrhea, heartburn, nausea and vomiting.  Genitourinary:  Negative for dysuria, frequency, hematuria and urgency.  Musculoskeletal:  Negative for back pain, falls and myalgias.  Skin:  Negative for rash.  Neurological:  Positive for headaches. Negative for dizziness, sensory change, loss of consciousness and weakness.  Endo/Heme/Allergies:  Negative for environmental allergies. Does not bruise/bleed easily.  Psychiatric/Behavioral:  Positive for depression. Negative for suicidal ideas. The patient is nervous/anxious. The patient does not have insomnia.       Objective:     BP (!) 110/90 (BP Location: Left Arm, Patient Position: Sitting, Cuff Size: Normal)   Pulse 89   Temp 98.9 F (37.2 C) (Oral)   Resp 18   Ht 5\' 8"  (1.727 m)   Wt 189 lb 6.4 oz (85.9 kg)   SpO2 99%   BMI 28.80 kg/m  BP Readings from Last 3 Encounters:  09/25/23 (!) 110/90  09/09/23 121/75  08/26/23 100/70   Wt Readings from Last 3 Encounters:  09/25/23 189 lb 6.4 oz (85.9 kg)  09/09/23 193 lb (87.5 kg)  08/26/23 193 lb 8 oz (87.8 kg)   SpO2 Readings from Last 3 Encounters:  09/25/23 99%  09/09/23 99%  06/29/23 97%      Physical Exam Vitals and nursing note reviewed.   Constitutional:  General: She is not in acute distress.    Appearance: Normal appearance. She is well-developed.  HENT:     Head: Normocephalic and atraumatic.     Right Ear: Tympanic membrane, ear canal and external ear normal. There is no impacted cerumen.     Left Ear: Tympanic membrane, ear canal and external ear normal. There is no impacted cerumen.     Nose: Nose normal.     Mouth/Throat:     Mouth: Mucous membranes are moist.     Pharynx: Oropharynx is clear. No oropharyngeal exudate or posterior oropharyngeal erythema.  Eyes:     General: No scleral icterus.       Right eye: No discharge.        Left eye: No discharge.     Conjunctiva/sclera: Conjunctivae normal.     Pupils: Pupils are equal, round, and reactive to light.  Neck:     Thyroid: No thyromegaly or thyroid tenderness.     Vascular: No JVD.  Cardiovascular:     Rate and Rhythm: Normal rate and regular rhythm.     Heart sounds: Normal heart sounds. No murmur heard. Pulmonary:     Effort: Pulmonary effort is normal. No respiratory distress.     Breath sounds: Normal breath sounds.  Abdominal:     General: Bowel sounds are normal. There is no distension.     Palpations: Abdomen is soft. There is no mass.     Tenderness: There is no abdominal tenderness. There is no guarding or rebound.  Genitourinary:    Vagina: Normal.  Musculoskeletal:        General: No tenderness. Normal range of motion.     Cervical back: Normal range of motion and neck supple.     Right lower leg: No edema.     Left lower leg: No edema.     Comments: Ganglion L palm   Lymphadenopathy:     Cervical: No cervical adenopathy.  Skin:    General: Skin is warm and dry.     Findings: No erythema or rash.  Neurological:     Mental Status: She is alert and oriented to person, place, and time.     Cranial Nerves: No cranial nerve deficit.     Deep Tendon Reflexes: Reflexes are normal and symmetric.  Psychiatric:        Mood and Affect:  Mood normal.        Behavior: Behavior normal.        Thought Content: Thought content normal.        Judgment: Judgment normal.      No results found for any visits on 09/25/23.  Last CBC Lab Results  Component Value Date   WBC 4.0 03/17/2023   HGB 11.8 (L) 03/17/2023   HCT 36.1 03/17/2023   MCV 89.4 03/17/2023   MCH 28.7 06/18/2020   RDW 13.5 03/17/2023   PLT 338.0 03/17/2023   Last metabolic panel Lab Results  Component Value Date   GLUCOSE 97 03/17/2022   NA 142 03/17/2022   K 3.8 03/17/2022   CL 103 03/17/2022   CO2 32 03/17/2022   BUN 19 03/17/2022   CREATININE 0.81 03/17/2022   GFR 82.42 03/17/2022   CALCIUM 9.9 03/17/2022   PROT 7.1 03/17/2022   ALBUMIN 4.0 03/17/2022   LABGLOB 2.7 06/18/2020   AGRATIO 1.6 06/18/2020   BILITOT 0.3 03/17/2022   ALKPHOS 98 03/17/2022   AST 24 03/17/2022   ALT 15 03/17/2022   Last lipids Lab  Results  Component Value Date   CHOL 195 11/28/2021   HDL 59.10 11/28/2021   LDLCALC 125 (H) 11/28/2021   TRIG 55.0 11/28/2021   CHOLHDL 3 11/28/2021   Last hemoglobin A1c No results found for: "HGBA1C" Last thyroid functions Lab Results  Component Value Date   TSH 2.04 03/17/2023   T4TOTAL 9.2 03/17/2023   Last vitamin D Lab Results  Component Value Date   VD25OH 53.56 03/17/2023   Last vitamin B12 and Folate Lab Results  Component Value Date   VITAMINB12 745 03/17/2023      The 10-year ASCVD risk score (Arnett DK, et al., 2019) is: 1.8%    Assessment & Plan:   Problem List Items Addressed This Visit       Unprioritized   Dysphagia   Relevant Medications   pantoprazole (PROTONIX) 40 MG tablet   Tinnitus of both ears   Relevant Orders   Ambulatory referral to ENT   Long COVID   Relevant Orders   Ambulatory referral to Behavioral Health   Other Visit Diagnoses     Anxiety and depression    -  Primary   Relevant Medications   FLUoxetine (PROZAC) 10 MG capsule   Other Relevant Orders   Ambulatory  referral to Behavioral Health   Ganglion cyst of finger of left hand       Relevant Orders   Ambulatory referral to Hand Surgery   Iron deficiency anemia, unspecified iron deficiency anemia type       Relevant Orders   CBC with Differential/Platelet   Iron, TIBC and Ferritin Panel      Assessment and Plan    Post-Acute Sequelae of SARS-CoV-2 infection (PASC) Reports of persistent fatigue, tinnitus, and general malaise. Discussed the potential impact of COVID-19 on brain chemistry and overall health. -Continue current management and monitor symptoms.  Depression Reports of feeling depressed due to persistent ill health. Currently on Fluoxetine 40mg , but reports increased lethargy. -Reduce Fluoxetine to 30mg  daily to assess if energy levels improve.  Gastroesophageal Reflux Disease (GERD) Reports of persistent heartburn and reflux despite taking Omeprazole and Famotidine. Recent esophageal dilation procedure. -Change Omeprazole to Pantoprazole. -Continue Famotidine. -Advise patient to report to GI if symptoms do not improve.  Urinary Urgency Reports of urinary urgency despite taking Reditrex. Recent CT scan revealed a fibroid. -Continue Reditrex and monitor symptoms. -Plan follow-up with urologist.  Arthritis Reports of flare-ups and a painful ganglion cyst on hand. -Refer to a hand surgeon for evaluation of the ganglion cyst.  Anemia Reports of low iron levels and is currently taking over-the-counter supplements. -Check iron levels today.  Tinnitus Persistent ringing in both ears since COVID-19 infection. -Refer to an otolaryngologist for further evaluation.  General Health Maintenance / Followup Plans -Extend medical leave for six months. -Plan follow-up appointments with GI and urologist. -Check iron levels today. -Refer to a hand surgeon for evaluation of the ganglion cyst. -Refer to an otolaryngologist for further evaluation of tinnitus.       Return if  symptoms worsen or fail to improve.    Donato Schultz, DO

## 2023-09-25 NOTE — Patient Instructions (Signed)
Food Choices for Gastroesophageal Reflux Disease, Adult When you have gastroesophageal reflux disease (GERD), the foods you eat and your eating habits are very important. Choosing the right foods can help ease the discomfort of GERD. Consider working with a dietitian to help you make healthy food choices. What are tips for following this plan? Reading food labels Look for foods that are low in saturated fat. Foods that have less than 5% of daily value (DV) of fat and 0 g of trans fats may help with your symptoms. Cooking Cook foods using methods other than frying. This may include baking, steaming, grilling, or broiling. These are all methods that do not need a lot of fat for cooking. To add flavor, try to use herbs that are low in spice and acidity. Meal planning  Choose healthy foods that are low in fat, such as fruits, vegetables, whole grains, low-fat dairy products, lean meats, fish, and poultry. Eat frequent, small meals instead of three large meals each day. Eat your meals slowly, in a relaxed setting. Avoid bending over or lying down until 2-3 hours after eating. Limit high-fat foods such as fatty meats or fried foods. Limit your intake of fatty foods, such as oils, butter, and shortening. Avoid the following as told by your health care provider: Foods that cause symptoms. These may be different for different people. Keep a food diary to keep track of foods that cause symptoms. Alcohol. Drinking large amounts of liquid with meals. Eating meals during the 2-3 hours before bed. Lifestyle Maintain a healthy weight. Ask your health care provider what weight is healthy for you. If you need to lose weight, work with your health care provider to do so safely. Exercise for at least 30 minutes on 5 or more days each week, or as told by your health care provider. Avoid wearing clothes that fit tightly around your waist and chest. Do not use any products that contain nicotine or tobacco. These  products include cigarettes, chewing tobacco, and vaping devices, such as e-cigarettes. If you need help quitting, ask your health care provider. Sleep with the head of your bed raised. Use a wedge under the mattress or blocks under the bed frame to raise the head of the bed. Chew sugar-free gum after mealtimes. What foods should I eat?  Eat a healthy, well-balanced diet of fruits, vegetables, whole grains, low-fat dairy products, lean meats, fish, and poultry. Each person is different. Foods that may trigger symptoms in one person may not trigger any symptoms in another person. Work with your health care provider to identify foods that are safe for you. The items listed above may not be a complete list of recommended foods and beverages. Contact a dietitian for more information. What foods should I avoid? Limiting some of these foods may help manage the symptoms of GERD. Everyone is different. Consult a dietitian or your health care provider to help you identify the exact foods to avoid, if any. Fruits Any fruits prepared with added fat. Any fruits that cause symptoms. For some people this may include citrus fruits, such as oranges, grapefruit, pineapple, and lemons. Vegetables Deep-fried vegetables. French fries. Any vegetables prepared with added fat. Any vegetables that cause symptoms. For some people, this may include tomatoes and tomato products, chili peppers, onions and garlic, and horseradish. Grains Pastries or quick breads with added fat. Meats and other proteins High-fat meats, such as fatty beef or pork, hot dogs, ribs, ham, sausage, salami, and bacon. Fried meat or protein, including   fried fish and fried chicken. Nuts and nut butters, in large amounts. Dairy Whole milk and chocolate milk. Sour cream. Cream. Ice cream. Cream cheese. Milkshakes. Fats and oils Butter. Margarine. Shortening. Ghee. Beverages Coffee and tea, with or without caffeine. Carbonated beverages. Sodas. Energy  drinks. Fruit juice made with acidic fruits, such as orange or grapefruit. Tomato juice. Alcoholic drinks. Sweets and desserts Chocolate and cocoa. Donuts. Seasonings and condiments Pepper. Peppermint and spearmint. Added salt. Any condiments, herbs, or seasonings that cause symptoms. For some people, this may include curry, hot sauce, or vinegar-based salad dressings. The items listed above may not be a complete list of foods and beverages to avoid. Contact a dietitian for more information. Questions to ask your health care provider Diet and lifestyle changes are usually the first steps that are taken to manage symptoms of GERD. If diet and lifestyle changes do not improve your symptoms, talk with your health care provider about taking medicines. Where to find more information [employer]: aboutgerd.org Summary When you have gastroesophageal reflux disease (GERD), food and lifestyle choices may be very helpful in easing the discomfort of GERD. Eat frequent, small meals instead of three large meals each day. Eat your meals slowly, in a relaxed setting. Avoid bending over or lying down until 2-3 hours after eating. Limit high-fat foods such as fatty meats or fried foods. This information is not intended to replace advice given to you by your health care provider. Make sure you discuss any questions you have with your health care provider. Document Revised: 06/04/2020 Document Reviewed: 06/04/2020 Elsevier Patient Education  2024 [employer]  

## 2023-09-26 LAB — CBC WITH DIFFERENTIAL/PLATELET
Absolute Lymphocytes: 1649 {cells}/uL (ref 850–3900)
Absolute Monocytes: 403 {cells}/uL (ref 200–950)
Basophils Absolute: 11 {cells}/uL (ref 0–200)
Basophils Relative: 0.3 %
Eosinophils Absolute: 49 {cells}/uL (ref 15–500)
Eosinophils Relative: 1.4 %
HCT: 38 % (ref 35.0–45.0)
Hemoglobin: 12 g/dL (ref 11.7–15.5)
MCH: 28.8 pg (ref 27.0–33.0)
MCHC: 31.6 g/dL — ABNORMAL LOW (ref 32.0–36.0)
MCV: 91.3 fL (ref 80.0–100.0)
MPV: 10.8 fL (ref 7.5–12.5)
Monocytes Relative: 11.5 %
Neutro Abs: 1390 {cells}/uL — ABNORMAL LOW (ref 1500–7800)
Neutrophils Relative %: 39.7 %
Platelets: 357 10*3/uL (ref 140–400)
RBC: 4.16 10*6/uL (ref 3.80–5.10)
RDW: 12.1 % (ref 11.0–15.0)
Total Lymphocyte: 47.1 %
WBC: 3.5 10*3/uL — ABNORMAL LOW (ref 3.8–10.8)

## 2023-09-26 LAB — IRON,TIBC AND FERRITIN PANEL
%SAT: 27 % (ref 16–45)
Ferritin: 35 ng/mL (ref 16–232)
Iron: 103 ug/dL (ref 45–160)
TIBC: 381 ug/dL (ref 250–450)

## 2023-10-07 ENCOUNTER — Other Ambulatory Visit (INDEPENDENT_AMBULATORY_CARE_PROVIDER_SITE_OTHER): Payer: BC Managed Care – PPO

## 2023-10-07 ENCOUNTER — Ambulatory Visit (INDEPENDENT_AMBULATORY_CARE_PROVIDER_SITE_OTHER): Payer: BC Managed Care – PPO | Admitting: Orthopaedic Surgery

## 2023-10-07 ENCOUNTER — Encounter: Payer: Self-pay | Admitting: Orthopaedic Surgery

## 2023-10-07 DIAGNOSIS — R2232 Localized swelling, mass and lump, left upper limb: Secondary | ICD-10-CM

## 2023-10-07 NOTE — Progress Notes (Addendum)
Office Visit Note   Patient: Suzanne Singh           Date of Birth: [birth year]           MRN: [id number] Visit Date: 10/07/2023              Requested by: [address] [place], [place] [address] [place],  [place] [address] PCP: Suzanne Singh, Grayling Congress, DO   Assessment & Plan: Visit Diagnoses:  1. Mass of hand, left     Plan: Patient is a 57 year old female with mass in the palm of her left hand.  Difficult to say if this is a ganglion cyst or a soft tissue mass.  Will need MRI to further clarify.  Follow-up follow-up after the MRI.  Follow-Up Instructions: No follow-ups on file.   Orders:  Orders Placed This Encounter  Procedures   XR Hand Complete Left   MR HAND LEFT WO CONTRAST   No orders of the defined types were placed in this encounter.     Procedures: No procedures performed   Clinical Data: No additional findings.   Subjective: Chief Complaint  Patient presents with   Left Hand - Pain    HPI Suzanne Singh is a very pleasant 57 year old female comes in for evaluation of left hand mass in the palm that started about 4 to 5 years ago.  It bothers her with repetitive use of the hand.  She also experiences cramping.  She feels like some swelling between the index and long finger metacarpal heads.  She has been managing with diclofenac and over-the-counter medications as well as heat and ice.  Denies any numbness or tingling. Review of Systems  Constitutional: Negative.   HENT: Negative.    Eyes: Negative.   Respiratory: Negative.    Cardiovascular: Negative.   Endocrine: Negative.   Musculoskeletal: Negative.   Neurological: Negative.   Hematological: Negative.   Psychiatric/Behavioral: Negative.    All other systems reviewed and are negative.    Objective: Vital Signs: There were no vitals taken for this visit.  Physical Exam Vitals and nursing note reviewed.  Constitutional:      Appearance: She is well-developed.  HENT:     Head:  Atraumatic.     Nose: Nose normal.  Eyes:     Extraocular Movements: Extraocular movements intact.  Cardiovascular:     Pulses: Normal pulses.  Pulmonary:     Effort: Pulmonary effort is normal.  Abdominal:     Palpations: Abdomen is soft.  Musculoskeletal:     Cervical back: Neck supple.  Skin:    General: Skin is warm.     Capillary Refill: Capillary refill takes less than 2 seconds.  Neurological:     Mental Status: She is alert. Mental status is at baseline.  Psychiatric:        Behavior: Behavior normal.        Thought Content: Thought content normal.        Judgment: Judgment normal.     Ortho Exam Exam of the left hand shows a tender mass on the flexion crease in line with the middle finger.  This is nonmobile.  There is no locking of the middle finger.  No overlying skin changes. Specialty Comments:  No specialty comments available.  Imaging: XR Hand Complete Left  Result Date: 10/07/2023 X-rays of the left hand show periarticular erosions.  No degenerative changes.    PMFS History: Patient Active Problem List   Diagnosis  Date Noted   Hx of adenomatous polyp of colon 09/07/2023   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   Acute right ankle pain 05/25/2023   Right foot pain 05/25/2023   Right calf pain 05/25/2023   Acute left-sided low back pain with left-sided sciatica 11/28/2022   Congenital single kidney 06/16/2022   Atypical chest pain 03/17/2022   Dysphagia 03/17/2022   Whiplash injury to neck 10/03/2021   DDD (degenerative disc disease), cervical 03/21/2021   Palpitations 10/04/2020   Tachycardia 06/14/2020   Shortness of breath on exertion 06/14/2020   Memory loss 06/14/2020   Brain fog 06/14/2020   Benign paroxysmal positional vertigo due to bilateral vestibular disorder 06/14/2020   Tinnitus of both ears 06/14/2020   Physical deconditioning 06/14/2020   Essential hypertension 05/16/2020   Stress headache 05/16/2020   SOB (shortness of breath) 05/16/2020    Immunization reaction 05/16/2020   Neck pain on right side 03/31/2018   Viral URI with cough 04/25/2017   Abscess 12/27/2016   Left anterior shoulder pain 06/28/2015   Skin infection 01/12/2015   Breast pain 01/12/2015   UTI (urinary tract infection) 11/16/2014   Weight gain 11/16/2014   Bilateral knee pain 09/21/2014   Thyromegaly 09/21/2014   Obesity (BMI 30-39.9) 06/21/2014   Edema 04/07/2014   Maxillary sinusitis 11/07/2013   Elevated BP 11/07/2013   Insomnia 07/28/2013   Epigastric pain 12/06/2012   Menopause 12/06/2012   IBS (irritable bowel syndrome) 12/06/2012   Fatigue 12/06/2012   Family hx of colon cancer in grandparent 12/06/2012   Screening for malignant neoplasm of cervix 07/22/2012   Preventative health care 07/22/2012   Depression with anxiety 07/22/2012   Eating disorder 07/22/2012   GERD (gastroesophageal reflux disease) 07/22/2012   Fibromyalgia 07/22/2012   Past Medical History:  Diagnosis Date   Anxiety    Arrhythmia    Arthritis    Depression    Eating disorder    Fibromyalgia    GERD (gastroesophageal reflux disease)    Hx of adenomatous polyp of colon 09/07/2023   Diminutive - on recall 2029 (also has Fhx CRCA)    IBS (irritable bowel syndrome)    Migraine    Seizures (HCC)    last 2008    Family History  Problem Relation Age of Onset   Colon cancer Mother    Alcohol abuse Mother    Arthritis Mother    Lung cancer Mother    Hypertension Mother    Mental illness Mother    Alcohol abuse Father    Heart disease Father    Hypertension Father    Diabetes Father    Colon cancer Father    Early death Sister    Colon cancer Maternal Aunt    Arthritis Maternal Grandmother    Hypertension Maternal Grandmother    Breast cancer Maternal Grandmother    Stroke Maternal Grandfather    Hypertension Maternal Grandfather    Heart disease Paternal Grandmother    Hypertension Paternal Grandmother    Colon cancer Paternal Grandfather    Heart  disease Paternal Grandfather    Hypertension Paternal Grandfather    Kidney disease Paternal Grandfather    Diabetes Paternal Grandfather    Stomach cancer Neg Hx    Esophageal cancer Neg Hx     Past Surgical History:  Procedure Laterality Date   COLONOSCOPY Bilateral 2014   myomectomy Bilateral 04/2021   Social History   Occupational History   Occupation: Runner, broadcasting/film/video  Tobacco Use   Smoking status: Never  Smokeless tobacco: Never  Vaping Use   Vaping status: Never Used  Substance and Sexual Activity   Alcohol use: Not Currently    Comment: wine on occasion   Drug use: No   Sexual activity: Not Currently

## 2023-10-08 ENCOUNTER — Telehealth: Payer: Self-pay | Admitting: Orthopaedic Surgery

## 2023-10-08 NOTE — Telephone Encounter (Signed)
Received call from patient, she wants to know if she needs to have MRI? Pts callback [phone]

## 2023-10-08 NOTE — Telephone Encounter (Signed)
Yes I think we discussed ordering one which we did. Didn't we?

## 2023-10-08 NOTE — Telephone Encounter (Signed)
Called patient. MRI was ordered and she was aware of that. She was just wondering why the MRI needed to be done. Spoke with patient.

## 2023-10-23 ENCOUNTER — Encounter: Payer: Self-pay | Admitting: Orthopaedic Surgery

## 2023-10-26 ENCOUNTER — Inpatient Hospital Stay
Admission: RE | Admit: 2023-10-26 | Discharge: 2023-10-26 | Discharge status: Home / Self Care | Payer: BC Managed Care – PPO | Source: Ambulatory Visit | Attending: Orthopaedic Surgery | Admitting: Orthopaedic Surgery

## 2023-10-26 DIAGNOSIS — R2232 Localized swelling, mass and lump, left upper limb: Secondary | ICD-10-CM

## 2023-10-27 ENCOUNTER — Telehealth: Payer: Self-pay | Admitting: Orthopaedic Surgery

## 2023-10-27 NOTE — Telephone Encounter (Signed)
Called pt LVM for her to callback and schedule MRI review pt had imaging done 11/18 next avail appt was 11/27 morning appts

## 2023-11-04 ENCOUNTER — Ambulatory Visit: Payer: BC Managed Care – PPO | Admitting: Orthopaedic Surgery

## 2023-11-04 ENCOUNTER — Encounter: Payer: Self-pay | Admitting: Orthopaedic Surgery

## 2023-11-04 DIAGNOSIS — R2232 Localized swelling, mass and lump, left upper limb: Secondary | ICD-10-CM | POA: Diagnosis not present

## 2023-11-04 NOTE — Progress Notes (Signed)
Office Visit Note   Patient: Suzanne Singh           Date of Birth: August 14, 1966           MRN: [id number] Visit Date: 11/04/2023              Requested by: [address], [place], [place] [address] [place],  [place] [address] PCP: Zola Button, Grayling Congress, DO   Assessment & Plan: Visit Diagnoses:  1. Mass of hand, left     Plan: MRI of the left hand shows a subcutaneous fatty lesion without any aggressive features.  No other masses were found.  Based on these findings and the location of the mass the patient would like to have this surgically excised as this affects hand function.  Eunice Blase will call the patient to confirm surgery date.  Follow-Up Instructions: No follow-ups on file.   Orders:  No orders of the defined types were placed in this encounter.  No orders of the defined types were placed in this encounter.     Procedures: No procedures performed   Clinical Data: No additional findings.   Subjective: Chief Complaint  Patient presents with   Left Hand - Follow-up    MRI review    HPI Symba returns today to discuss left hand MRI scan.  Review of Systems  Constitutional: Negative.   HENT: Negative.    Eyes: Negative.   Respiratory: Negative.    Cardiovascular: Negative.   Endocrine: Negative.   Musculoskeletal: Negative.   Neurological: Negative.   Hematological: Negative.   Psychiatric/Behavioral: Negative.    All other systems reviewed and are negative.    Objective: Vital Signs: There were no vitals taken for this visit.  Physical Exam Vitals and nursing note reviewed.  Constitutional:      Appearance: She is well-developed.  HENT:     Head: Normocephalic and atraumatic.  Pulmonary:     Effort: Pulmonary effort is normal.  Abdominal:     Palpations: Abdomen is soft.  Musculoskeletal:     Cervical back: Neck supple.  Skin:    General: Skin is warm.     Capillary Refill: Capillary refill takes less than 2 seconds.   Neurological:     Mental Status: She is alert and oriented to person, place, and time.  Psychiatric:        Behavior: Behavior normal.        Thought Content: Thought content normal.        Judgment: Judgment normal.     Ortho Exam Exam of the left hand shows firm raised mass in the palm of the hand in line with the long finger between the proximal and distal palmar flexion creases. Specialty Comments:  No specialty comments available.  Imaging: No results found.   PMFS History: Patient Active Problem List   Diagnosis Date Noted   Hx of adenomatous polyp of colon 09/07/2023   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   Acute right ankle pain 05/25/2023   Right foot pain 05/25/2023   Right calf pain 05/25/2023   Acute left-sided low back pain with left-sided sciatica 11/28/2022   Congenital single kidney 06/16/2022   Atypical chest pain 03/17/2022   Dysphagia 03/17/2022   Whiplash injury to neck 10/03/2021   DDD (degenerative disc disease), cervical 03/21/2021   Palpitations 10/04/2020   Tachycardia 06/14/2020   Shortness of breath on exertion 06/14/2020   Memory loss 06/14/2020   Brain fog 06/14/2020   Benign paroxysmal positional  vertigo due to bilateral vestibular disorder 06/14/2020   Tinnitus of both ears 06/14/2020   Physical deconditioning 06/14/2020   Essential hypertension 05/16/2020   Stress headache 05/16/2020   SOB (shortness of breath) 05/16/2020   Immunization reaction 05/16/2020   Neck pain on right side 03/31/2018   Viral URI with cough 04/25/2017   Abscess 12/27/2016   Left anterior shoulder pain 06/28/2015   Skin infection 01/12/2015   Breast pain 01/12/2015   UTI (urinary tract infection) 11/16/2014   Weight gain 11/16/2014   Bilateral knee pain 09/21/2014   Thyromegaly 09/21/2014   Obesity (BMI 30-39.9) 06/21/2014   Edema 04/07/2014   Maxillary sinusitis 11/07/2013   Elevated BP 11/07/2013   Insomnia 07/28/2013   Epigastric pain 12/06/2012   Menopause  12/06/2012   IBS (irritable bowel syndrome) 12/06/2012   Fatigue 12/06/2012   Family hx of colon cancer in grandparent 12/06/2012   Screening for malignant neoplasm of cervix 07/22/2012   Preventative health care 07/22/2012   Depression with anxiety 07/22/2012   Eating disorder 07/22/2012   GERD (gastroesophageal reflux disease) 07/22/2012   Fibromyalgia 07/22/2012   Past Medical History:  Diagnosis Date   Anxiety    Arrhythmia    Arthritis    Depression    Eating disorder    Fibromyalgia    GERD (gastroesophageal reflux disease)    Hx of adenomatous polyp of colon 09/07/2023   Diminutive - on recall 2029 (also has Fhx CRCA)    IBS (irritable bowel syndrome)    Migraine    Seizures (HCC)    last 2008    Family History  Problem Relation Age of Onset   Colon cancer Mother    Alcohol abuse Mother    Arthritis Mother    Lung cancer Mother    Hypertension Mother    Mental illness Mother    Alcohol abuse Father    Heart disease Father    Hypertension Father    Diabetes Father    Colon cancer Father    Early death Sister    Colon cancer Maternal Aunt    Arthritis Maternal Grandmother    Hypertension Maternal Grandmother    Breast cancer Maternal Grandmother    Stroke Maternal Grandfather    Hypertension Maternal Grandfather    Heart disease Paternal Grandmother    Hypertension Paternal Grandmother    Colon cancer Paternal Grandfather    Heart disease Paternal Grandfather    Hypertension Paternal Grandfather    Kidney disease Paternal Grandfather    Diabetes Paternal Grandfather    Stomach cancer Neg Hx    Esophageal cancer Neg Hx     Past Surgical History:  Procedure Laterality Date   COLONOSCOPY Bilateral 2014   myomectomy Bilateral 04/2021   Social History   Occupational History   Occupation: Runner, broadcasting/film/video  Tobacco Use   Smoking status: Never   Smokeless tobacco: Never  Vaping Use   Vaping status: Never Used  Substance and Sexual Activity   Alcohol use:  Not Currently    Comment: wine on occasion   Drug use: No   Sexual activity: Not Currently

## 2023-11-12 ENCOUNTER — Other Ambulatory Visit: Payer: Self-pay | Admitting: Physician Assistant

## 2023-11-12 MED ORDER — ONDANSETRON HCL 4 MG PO TABS: 4.0000 mg | ORAL_TABLET | Freq: Three times a day (TID) | ORAL | 0 refills | Status: AC | PRN

## 2023-11-12 MED ORDER — TRAMADOL HCL 50 MG PO TABS: 50.0000 mg | ORAL_TABLET | Freq: Three times a day (TID) | ORAL | 0 refills | Status: AC | PRN

## 2023-11-18 ENCOUNTER — Other Ambulatory Visit: Payer: Self-pay | Admitting: Physician Assistant

## 2023-11-19 ENCOUNTER — Other Ambulatory Visit: Payer: Self-pay

## 2023-11-19 DIAGNOSIS — R2232 Localized swelling, mass and lump, left upper limb: Secondary | ICD-10-CM

## 2023-11-23 LAB — SURGICAL PATHOLOGY

## 2023-11-27 ENCOUNTER — Ambulatory Visit (INDEPENDENT_AMBULATORY_CARE_PROVIDER_SITE_OTHER): Payer: BC Managed Care – PPO | Admitting: Orthopaedic Surgery

## 2023-11-27 ENCOUNTER — Encounter: Payer: Self-pay | Admitting: Orthopaedic Surgery

## 2023-11-27 DIAGNOSIS — R2232 Localized swelling, mass and lump, left upper limb: Secondary | ICD-10-CM

## 2023-11-27 NOTE — Progress Notes (Signed)
Post-Op Visit Note   Patient: Suzanne Singh           Date of Birth: 1966/07/19           MRN: [id number] Visit Date: 11/27/2023 PCP: Donato Schultz, DO   Assessment & Plan:  Chief Complaint:  Chief Complaint  Patient presents with   Left Hand - Follow-up    Mass excision 11/19/2023   Visit Diagnoses:  1. Mass of hand, left     Plan: Patient is 8 days postop from a left hand mass excision.  Pathology was benign.  She is doing well overall.  No complaints.  The surgical incision is healed without any signs of drainage or infection.  She has an appropriate amount of postsurgical swelling.  No neurovascular compromise.  Sutures removed and Steri-Strips applied.  Activity as tolerated.  Follow-up as needed.  Follow-Up Instructions: No follow-ups on file.   Orders:  No orders of the defined types were placed in this encounter.  No orders of the defined types were placed in this encounter.   Imaging: No results found.  PMFS History: Patient Active Problem List   Diagnosis Date Noted   Hx of adenomatous polyp of colon 09/07/2023   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   Acute right ankle pain 05/25/2023   Right foot pain 05/25/2023   Right calf pain 05/25/2023   Acute left-sided low back pain with left-sided sciatica 11/28/2022   Congenital single kidney 06/16/2022   Atypical chest pain 03/17/2022   Dysphagia 03/17/2022   Whiplash injury to neck 10/03/2021   DDD (degenerative disc disease), cervical 03/21/2021   Palpitations 10/04/2020   Tachycardia 06/14/2020   Shortness of breath on exertion 06/14/2020   Memory loss 06/14/2020   Brain fog 06/14/2020   Benign paroxysmal positional vertigo due to bilateral vestibular disorder 06/14/2020   Tinnitus of both ears 06/14/2020   Physical deconditioning 06/14/2020   Essential hypertension 05/16/2020   Stress headache 05/16/2020   SOB (shortness of breath) 05/16/2020   Immunization reaction 05/16/2020   Neck pain on right side  03/31/2018   Viral URI with cough 04/25/2017   Abscess 12/27/2016   Left anterior shoulder pain 06/28/2015   Skin infection 01/12/2015   Breast pain 01/12/2015   UTI (urinary tract infection) 11/16/2014   Weight gain 11/16/2014   Bilateral knee pain 09/21/2014   Thyromegaly 09/21/2014   Obesity (BMI 30-39.9) 06/21/2014   Edema 04/07/2014   Maxillary sinusitis 11/07/2013   Elevated BP 11/07/2013   Insomnia 07/28/2013   Epigastric pain 12/06/2012   Menopause 12/06/2012   IBS (irritable bowel syndrome) 12/06/2012   Fatigue 12/06/2012   Family hx of colon cancer in grandparent 12/06/2012   Screening for malignant neoplasm of cervix 07/22/2012   Preventative health care 07/22/2012   Depression with anxiety 07/22/2012   Eating disorder 07/22/2012   GERD (gastroesophageal reflux disease) 07/22/2012   Fibromyalgia 07/22/2012   Past Medical History:  Diagnosis Date   Anxiety    Arrhythmia    Arthritis    Depression    Eating disorder    Fibromyalgia    GERD (gastroesophageal reflux disease)    Hx of adenomatous polyp of colon 09/07/2023   Diminutive - on recall 2029 (also has Fhx CRCA)    IBS (irritable bowel syndrome)    Migraine    Seizures (HCC)    last 2008    Family History  Problem Relation Age of Onset   Colon cancer Mother    Alcohol  abuse Mother    Arthritis Mother    Lung cancer Mother    Hypertension Mother    Mental illness Mother    Alcohol abuse Father    Heart disease Father    Hypertension Father    Diabetes Father    Colon cancer Father    Early death Sister    Colon cancer Maternal Aunt    Arthritis Maternal Grandmother    Hypertension Maternal Grandmother    Breast cancer Maternal Grandmother    Stroke Maternal Grandfather    Hypertension Maternal Grandfather    Heart disease Paternal Grandmother    Hypertension Paternal Grandmother    Colon cancer Paternal Grandfather    Heart disease Paternal Grandfather    Hypertension Paternal  Grandfather    Kidney disease Paternal Grandfather    Diabetes Paternal Grandfather    Stomach cancer Neg Hx    Esophageal cancer Neg Hx     Past Surgical History:  Procedure Laterality Date   COLONOSCOPY Bilateral 2014   myomectomy Bilateral 04/2021   Social History   Occupational History   Occupation: Runner, broadcasting/film/video  Tobacco Use   Smoking status: Never   Smokeless tobacco: Never  Vaping Use   Vaping status: Never Used  Substance and Sexual Activity   Alcohol use: Not Currently    Comment: wine on occasion   Drug use: No   Sexual activity: Not Currently

## 2024-02-11 ENCOUNTER — Telehealth: Admitting: Family Medicine

## 2024-02-11 DIAGNOSIS — R6889 Other general symptoms and signs: Secondary | ICD-10-CM

## 2024-02-11 MED ORDER — PROMETHAZINE-DM 6.25-15 MG/5ML PO SYRP
5.0000 mL | ORAL_SOLUTION | Freq: Four times a day (QID) | ORAL | 0 refills | Status: AC | PRN
Start: 2024-02-11 — End: ?

## 2024-02-11 MED ORDER — OSELTAMIVIR PHOSPHATE 75 MG PO CAPS
75.0000 mg | ORAL_CAPSULE | Freq: Two times a day (BID) | ORAL | 0 refills | Status: AC
Start: 2024-02-11 — End: 2024-02-16

## 2024-02-11 MED ORDER — BENZONATATE 100 MG PO CAPS
100.0000 mg | ORAL_CAPSULE | Freq: Three times a day (TID) | ORAL | 0 refills | Status: AC | PRN
Start: 2024-02-11 — End: ?

## 2024-02-11 MED ORDER — FLUTICASONE PROPIONATE 50 MCG/ACT NA SUSP
2.0000 | Freq: Every day | NASAL | 0 refills | Status: AC
Start: 2024-02-11 — End: ?

## 2024-02-11 NOTE — Progress Notes (Signed)
 Virtual Visit Consent   Suzanne Singh, you are scheduled for a virtual visit with a [facility] provider today. Just as with appointments in the office, your consent must be obtained to participate. Your consent will be active for this visit and any virtual visit you may have with one of our providers in the next 365 days. If you have a MyChart account, a copy of this consent can be sent to you electronically.  As this is a virtual visit, video technology does not allow for your provider to perform a traditional examination. This may limit your provider's ability to fully assess your condition. If your provider identifies any concerns that need to be evaluated in person or the need to arrange testing (such as labs, EKG, etc.), we will make arrangements to do so. Although advances in technology are sophisticated, we cannot ensure that it will always work on either your end or our end. If the connection with a video visit is poor, the visit may have to be switched to a telephone visit. With either a video or telephone visit, we are not always able to ensure that we have a secure connection.  By engaging in this virtual visit, you consent to the provision of healthcare and authorize for your insurance to be billed (if applicable) for the services provided during this visit. Depending on your insurance coverage, you may receive a charge related to this service.  I need to obtain your verbal consent now. Are you willing to proceed with your visit today? Suzanne Singh has provided verbal consent on 02/11/2024 for a virtual visit (video or telephone). Freddy Finner, NP  Date: 02/11/2024 10:13 AM   Virtual Visit via Video Note   I, Freddy Finner, connected with  [name]  ([id number], 10-07-1966) on 02/11/24 at 10:15 AM EST by a video-enabled telemedicine application and verified that I am speaking with the correct person using two identifiers.  Location: Patient: Virtual Visit Location Patient:  Home Provider: Virtual Visit Location Provider: Home Office   I discussed the limitations of evaluation and management by telemedicine and the availability of in person appointments. The patient expressed understanding and agreed to proceed.    History of Present Illness: Suzanne Singh is a 58 y.o. who identifies as a female who was assigned female at birth, and is being seen today for flu like symptoms  Onset was Tuesday with flu like symptoms sore throat  Associated symptoms are fatigue, cough, watery eyes, ears, muscle aches, fever - has improved,  Modifying factors are ibuprofen, tussin cough syrup, elderberry, ginger tea, cold ezze, zinc, emergent C  Denies chest pain, shortness of breath, fevers, chills  Exposure to sick contacts- known- household sick as well  Vaccines: not up to date  Problems:  Patient Active Problem List   Diagnosis Date Noted   Hx of adenomatous polyp of colon 09/07/2023   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   Acute right ankle pain 05/25/2023   Right foot pain 05/25/2023   Right calf pain 05/25/2023   Acute left-sided low back pain with left-sided sciatica 11/28/2022   Congenital single kidney 06/16/2022   Atypical chest pain 03/17/2022   Dysphagia 03/17/2022   Whiplash injury to neck 10/03/2021   DDD (degenerative disc disease), cervical 03/21/2021   Palpitations 10/04/2020   Tachycardia 06/14/2020   Shortness of breath on exertion 06/14/2020   Memory loss 06/14/2020   Brain fog 06/14/2020   Benign paroxysmal positional vertigo due to bilateral vestibular disorder 06/14/2020  Tinnitus of both ears 06/14/2020   Physical deconditioning 06/14/2020   Essential hypertension 05/16/2020   Stress headache 05/16/2020   SOB (shortness of breath) 05/16/2020   Immunization reaction 05/16/2020   Neck pain on right side 03/31/2018   Viral URI with cough 04/25/2017   Abscess 12/27/2016   Left anterior shoulder pain 06/28/2015   Skin infection 01/12/2015   Breast pain  01/12/2015   UTI (urinary tract infection) 11/16/2014   Weight gain 11/16/2014   Bilateral knee pain 09/21/2014   Thyromegaly 09/21/2014   Obesity (BMI 30-39.9) 06/21/2014   Edema 04/07/2014   Maxillary sinusitis 11/07/2013   Elevated BP 11/07/2013   Insomnia 07/28/2013   Epigastric pain 12/06/2012   Menopause 12/06/2012   IBS (irritable bowel syndrome) 12/06/2012   Fatigue 12/06/2012   Family hx of colon cancer in grandparent 12/06/2012   Screening for malignant neoplasm of cervix 07/22/2012   Preventative health care 07/22/2012   Depression with anxiety 07/22/2012   Eating disorder 07/22/2012   GERD (gastroesophageal reflux disease) 07/22/2012   Fibromyalgia 07/22/2012    Allergies:  Allergies  Allergen Reactions   Ambien Cr [Zolpidem Tartrate Er]     Seizure    Covid-19 Mrna Vacc (Moderna) Swelling   Cymbalta [Duloxetine Hcl]     Seizure    Celebrex [Celecoxib]     Hives    Hydrocodone Itching   Latex Rash    Adhesive   Mobic [Meloxicam] Palpitations   Medications:  Current Outpatient Medications:    albuterol (PROAIR HFA) 108 (90 Base) MCG/ACT inhaler, Inhale 2 puffs into the lungs every 6 (six) hours as needed for wheezing or shortness of breath., Disp: 18 g, Rfl: 3   APPLE CIDER VINEGAR PO, Take 2 tablets by mouth daily., Disp: , Rfl:    Barberry-Oreg Grape-Goldenseal (BERBERINE COMPLEX PO), Take 1 capsule by mouth daily., Disp: , Rfl:    BIOTIN PO, Take 1 tablet by mouth daily., Disp: , Rfl:    Coenzyme Q10 (CO Q 10 PO), Take 1 Dose by mouth daily., Disp: , Rfl:    COLLAGEN PO, Take 1 tablet by mouth daily., Disp: , Rfl:    CRANBERRY PO, Take 1 tablet by mouth daily., Disp: , Rfl:    cyclobenzaprine (FLEXERIL) 10 MG tablet, Take 1 tablet (10 mg total) by mouth 3 (three) times daily as needed for muscle spasms., Disp: 30 tablet, Rfl: 0   diazepam (VALIUM) 10 MG tablet, Take 1 tablet (10 mg total) by mouth every 12 (twelve) hours as needed for anxiety., Disp: 30  tablet, Rfl: 1   diclofenac (VOLTAREN) 75 MG EC tablet, Take 1 tablet (75 mg total) by mouth 2 (two) times daily., Disp: 60 tablet, Rfl: 1   famotidine (PEPCID) 20 MG tablet, Take 1 tablet (20 mg total) by mouth 2 (two) times daily., Disp: 60 tablet, Rfl: 3   FLUoxetine (PROZAC) 10 MG capsule, 3 po every day, Disp: 90 capsule, Rfl: 3   fluticasone (FLONASE) 50 MCG/ACT nasal spray, Place 2 sprays into both nostrils daily., Disp: 16 g, Rfl: 6   Multiple Vitamins-Minerals (MULTIVITAMIN GUMMIES WOMENS PO), Take 2 tablets by mouth daily., Disp: , Rfl:    Olopatadine HCl (PATADAY) 0.2 % SOLN, Place 1 drop into both eyes daily as needed., Disp: 2.5 mL, Rfl: 5   ondansetron (ZOFRAN) 4 MG tablet, Take 1 tablet (4 mg total) by mouth every 8 (eight) hours as needed for nausea or vomiting., Disp: 40 tablet, Rfl: 0   pantoprazole (PROTONIX)  40 MG tablet, Take 1 tablet (40 mg total) by mouth daily., Disp: 30 tablet, Rfl: 3   Probiotic Product (PROBIOTIC PO), Take 1 capsule by mouth daily., Disp: , Rfl:    traMADol (ULTRAM) 50 MG tablet, Take 1 tablet (50 mg total) by mouth 3 (three) times daily as needed., Disp: 30 tablet, Rfl: 0  Observations/Objective: Patient is well-developed, well-nourished in no acute distress.  Resting comfortably  at home.  Head is normocephalic, atraumatic.  No labored breathing.  Speech is clear and coherent with logical content.  Patient is alert and oriented at baseline.    Assessment and Plan:  1. Flu-like symptoms (Primary)  - oseltamivir (TAMIFLU) 75 MG capsule; Take 1 capsule (75 mg total) by mouth 2 (two) times daily for 5 days.  Dispense: 10 capsule; Refill: 0 - fluticasone (FLONASE) 50 MCG/ACT nasal spray; Place 2 sprays into both nostrils daily.  Dispense: 16 g; Refill: 0 - promethazine-dextromethorphan (PROMETHAZINE-DM) 6.25-15 MG/5ML syrup; Take 5 mLs by mouth 4 (four) times daily as needed for cough.  Dispense: 118 mL; Refill: 0 - benzonatate (TESSALON) 100 MG  capsule; Take 1 capsule (100 mg total) by mouth 3 (three) times daily as needed for cough.  Dispense: 30 capsule; Refill: 0   - Increased rest - Increasing Fluids - Acetaminophen / ibuprofen as needed for fever/pain.  - Salt water gargling, chloraseptic spray and throat lozenges - Mucinex if mucus is present and increasing.  - Saline nasal spray if congestion or if nasal passages feel dry. - Humidifying the air.    Reviewed side effects, risks and benefits of medication.    Patient acknowledged agreement and understanding of the plan.   Past Medical, Surgical, Social History, Allergies, and Medications have been Reviewed.   Follow Up Instructions: I discussed the assessment and treatment plan with the patient. The patient was provided an opportunity to ask questions and all were answered. The patient agreed with the plan and demonstrated an understanding of the instructions.  A copy of instructions were sent to the patient via MyChart unless otherwise noted below.    The patient was advised to call back or seek an in-person evaluation if the symptoms worsen or if the condition fails to improve as anticipated.    Freddy Finner, NP

## 2024-02-11 NOTE — Patient Instructions (Addendum)
 Engineer, water, thank you for joining Freddy Finner, NP for today's virtual visit.  While this provider is not your primary care provider (PCP), if your PCP is located in our provider database this encounter information will be shared with them immediately following your visit.   A [facility] MyChart account gives you access to today's visit and all your visits, tests, and labs performed at [facility] " click here if you don't have a [facility] MyChart account or go to [email or web address]  Consent: (Patient) Engineer, water provided verbal consent for this virtual visit at the beginning of the encounter.  Current Medications:  Current Outpatient Medications:    benzonatate (TESSALON) 100 MG capsule, Take 1 capsule (100 mg total) by mouth 3 (three) times daily as needed for cough., Disp: 30 capsule, Rfl: 0   fluticasone (FLONASE) 50 MCG/ACT nasal spray, Place 2 sprays into both nostrils daily., Disp: 16 g, Rfl: 0   oseltamivir (TAMIFLU) 75 MG capsule, Take 1 capsule (75 mg total) by mouth 2 (two) times daily for 5 days., Disp: 10 capsule, Rfl: 0   promethazine-dextromethorphan (PROMETHAZINE-DM) 6.25-15 MG/5ML syrup, Take 5 mLs by mouth 4 (four) times daily as needed for cough., Disp: 118 mL, Rfl: 0   albuterol (PROAIR HFA) 108 (90 Base) MCG/ACT inhaler, Inhale 2 puffs into the lungs every 6 (six) hours as needed for wheezing or shortness of breath., Disp: 18 g, Rfl: 3   APPLE CIDER VINEGAR PO, Take 2 tablets by mouth daily., Disp: , Rfl:    Barberry-Oreg Grape-Goldenseal (BERBERINE COMPLEX PO), Take 1 capsule by mouth daily., Disp: , Rfl:    BIOTIN PO, Take 1 tablet by mouth daily., Disp: , Rfl:    Coenzyme Q10 (CO Q 10 PO), Take 1 Dose by mouth daily., Disp: , Rfl:    COLLAGEN PO, Take 1 tablet by mouth daily., Disp: , Rfl:    CRANBERRY PO, Take 1 tablet by mouth daily., Disp: , Rfl:    cyclobenzaprine (FLEXERIL) 10 MG tablet, Take 1 tablet (10 mg total) by mouth 3 (three)  times daily as needed for muscle spasms., Disp: 30 tablet, Rfl: 0   diazepam (VALIUM) 10 MG tablet, Take 1 tablet (10 mg total) by mouth every 12 (twelve) hours as needed for anxiety., Disp: 30 tablet, Rfl: 1   diclofenac (VOLTAREN) 75 MG EC tablet, Take 1 tablet (75 mg total) by mouth 2 (two) times daily., Disp: 60 tablet, Rfl: 1   famotidine (PEPCID) 20 MG tablet, Take 1 tablet (20 mg total) by mouth 2 (two) times daily., Disp: 60 tablet, Rfl: 3   FLUoxetine (PROZAC) 10 MG capsule, 3 po every day, Disp: 90 capsule, Rfl: 3   fluticasone (FLONASE) 50 MCG/ACT nasal spray, Place 2 sprays into both nostrils daily., Disp: 16 g, Rfl: 6   Multiple Vitamins-Minerals (MULTIVITAMIN GUMMIES WOMENS PO), Take 2 tablets by mouth daily., Disp: , Rfl:    Olopatadine HCl (PATADAY) 0.2 % SOLN, Place 1 drop into both eyes daily as needed., Disp: 2.5 mL, Rfl: 5   ondansetron (ZOFRAN) 4 MG tablet, Take 1 tablet (4 mg total) by mouth every 8 (eight) hours as needed for nausea or vomiting., Disp: 40 tablet, Rfl: 0   pantoprazole (PROTONIX) 40 MG tablet, Take 1 tablet (40 mg total) by mouth daily., Disp: 30 tablet, Rfl: 3   Probiotic Product (PROBIOTIC PO), Take 1 capsule by mouth daily., Disp: , Rfl:    traMADol (ULTRAM) 50 MG tablet, Take 1 tablet (50 mg  total) by mouth 3 (three) times daily as needed., Disp: 30 tablet, Rfl: 0   Medications ordered in this encounter:  Meds ordered this encounter  Medications   oseltamivir (TAMIFLU) 75 MG capsule    Sig: Take 1 capsule (75 mg total) by mouth 2 (two) times daily for 5 days.    Dispense:  10 capsule    Refill:  0    Supervising Provider:   Merrilee Jansky [[id number]]   fluticasone (FLONASE) 50 MCG/ACT nasal spray    Sig: Place 2 sprays into both nostrils daily.    Dispense:  16 g    Refill:  0    Supervising Provider:   Merrilee Jansky [[id number]]   promethazine-dextromethorphan (PROMETHAZINE-DM) 6.25-15 MG/5ML syrup    Sig: Take 5 mLs by mouth 4 (four) times  daily as needed for cough.    Dispense:  118 mL    Refill:  0    Supervising Provider:   Merrilee Jansky [[id number]]   benzonatate (TESSALON) 100 MG capsule    Sig: Take 1 capsule (100 mg total) by mouth 3 (three) times daily as needed for cough.    Dispense:  30 capsule    Refill:  0    Supervising Provider:   Merrilee Jansky [[id number]]     *If you need refills on other medications prior to your next appointment, please contact your pharmacy*  Follow-Up: Call back or seek an in-person evaluation if the symptoms worsen or if the condition fails to improve as anticipated.  [facility] [phone]  Other Instructions  - Increased rest - Increasing Fluids - Acetaminophen / ibuprofen as needed for fever/pain.  - Salt water gargling, chloraseptic spray and throat lozenges - Mucinex if mucus is present and increasing.  - Saline nasal spray if congestion or if nasal passages feel dry. - Humidifying the air.    If you have been instructed to have an in-person evaluation today at a local Urgent Care facility, please use the link below. It will take you to a list of all of our available [facility], including address, phone number and hours of operation. Please do not delay care.  [facility]  If you or a family member do not have a primary care provider, use the link below to schedule a visit and establish care. When you choose a [facility] primary care physician or advanced practice provider, you gain a long-term partner in health. Find a Primary Care Provider  Learn more about [facility] in-office and virtual care options: [facility] - Get Care Now

## 2024-02-12 ENCOUNTER — Other Ambulatory Visit: Payer: Self-pay | Admitting: Family Medicine

## 2024-02-12 DIAGNOSIS — J0140 Acute pansinusitis, unspecified: Secondary | ICD-10-CM

## 2024-02-12 MED ORDER — AMOXICILLIN-POT CLAVULANATE 875-125 MG PO TABS
1.0000 | ORAL_TABLET | Freq: Two times a day (BID) | ORAL | 0 refills | Status: AC
Start: 2024-02-12 — End: ?

## 2024-02-26 ENCOUNTER — Ambulatory Visit (INDEPENDENT_AMBULATORY_CARE_PROVIDER_SITE_OTHER): Admitting: Family Medicine

## 2024-02-26 ENCOUNTER — Encounter: Payer: Self-pay | Admitting: Family Medicine

## 2024-02-26 ENCOUNTER — Telehealth: Admitting: Family Medicine

## 2024-02-26 VITALS — BP 112/80 | HR 78 | Temp 99.1°F | Resp 18 | Ht 68.0 in | Wt 191.2 lb

## 2024-02-26 DIAGNOSIS — T783XXD Angioneurotic edema, subsequent encounter: Secondary | ICD-10-CM | POA: Diagnosis not present

## 2024-02-26 DIAGNOSIS — R5383 Other fatigue: Secondary | ICD-10-CM | POA: Diagnosis not present

## 2024-02-26 DIAGNOSIS — D509 Iron deficiency anemia, unspecified: Secondary | ICD-10-CM

## 2024-02-26 DIAGNOSIS — U099 Post covid-19 condition, unspecified: Secondary | ICD-10-CM | POA: Diagnosis not present

## 2024-02-26 LAB — COMPREHENSIVE METABOLIC PANEL
ALT: 9 U/L (ref 0–35)
AST: 17 U/L (ref 0–37)
Albumin: 4.3 g/dL (ref 3.5–5.2)
Alkaline Phosphatase: 108 U/L (ref 39–117)
BUN: 14 mg/dL (ref 6–23)
CO2: 28 meq/L (ref 19–32)
Calcium: 9.6 mg/dL (ref 8.4–10.5)
Chloride: 105 meq/L (ref 96–112)
Creatinine, Ser: 0.72 mg/dL (ref 0.40–1.20)
GFR: 93.65 mL/min (ref >60.00)
Glucose, Bld: 86 mg/dL (ref 70–99)
Potassium: 4.5 meq/L (ref 3.5–5.1)
Sodium: 141 meq/L (ref 135–145)
Total Bilirubin: 0.5 mg/dL (ref 0.2–1.2)
Total Protein: 6.9 g/dL (ref 6.0–8.3)

## 2024-02-26 LAB — LIPID PANEL
Cholesterol: 191 mg/dL (ref 0–200)
HDL: 57.8 mg/dL (ref >39.00)
LDL Cholesterol: 122 mg/dL — ABNORMAL HIGH (ref 0–99)
NonHDL: 133.17
Total CHOL/HDL Ratio: 3
Triglycerides: 56 mg/dL (ref 0.0–149.0)
VLDL: 11.2 mg/dL (ref 0.0–40.0)

## 2024-02-26 LAB — CBC WITH DIFFERENTIAL/PLATELET
Basophils Absolute: 0 10*3/uL (ref 0.0–0.1)
Basophils Relative: 0.7 % (ref 0.0–3.0)
Eosinophils Absolute: 0.1 10*3/uL (ref 0.0–0.7)
Eosinophils Relative: 3.3 % (ref 0.0–5.0)
HCT: 36.3 % (ref 36.0–46.0)
Hemoglobin: 11.8 g/dL — ABNORMAL LOW (ref 12.0–15.0)
Lymphocytes Relative: 42 % (ref 12.0–46.0)
Lymphs Abs: 1.2 10*3/uL (ref 0.7–4.0)
MCHC: 32.5 g/dL (ref 30.0–36.0)
MCV: 90.3 fl (ref 78.0–100.0)
Monocytes Absolute: 0.4 10*3/uL (ref 0.1–1.0)
Monocytes Relative: 13.5 % — ABNORMAL HIGH (ref 3.0–12.0)
Neutro Abs: 1.2 10*3/uL — ABNORMAL LOW (ref 1.4–7.7)
Neutrophils Relative %: 40.5 % — ABNORMAL LOW (ref 43.0–77.0)
Platelets: 383 10*3/uL (ref 150.0–400.0)
RBC: 4.02 Mil/uL (ref 3.87–5.11)
RDW: 13.3 % (ref 11.5–15.5)
WBC: 3 10*3/uL — ABNORMAL LOW (ref 4.0–10.5)

## 2024-02-26 LAB — TSH: TSH: 1.4 u[IU]/mL (ref 0.35–5.50)

## 2024-02-26 MED ORDER — METHYLPREDNISOLONE ACETATE 80 MG/ML IJ SUSP
80.0000 mg | Freq: Once | INTRAMUSCULAR | Status: AC
Start: 2024-02-26 — End: ?

## 2024-02-26 MED ORDER — PREDNISONE 10 MG PO TABS
ORAL_TABLET | ORAL | 0 refills | Status: AC
Start: 2024-02-26 — End: ?

## 2024-02-26 MED ORDER — METHYLPREDNISOLONE ACETATE 80 MG/ML IJ SUSP
80.0000 mg | Freq: Once | INTRAMUSCULAR | Status: AC
Start: 2024-02-26 — End: 2024-02-26
  Administered 2024-02-26: 80 mg via INTRAMUSCULAR

## 2024-02-26 NOTE — Assessment & Plan Note (Signed)
 Pt still struggling with tinnitus, fatigue, myalgias etc  Fmla filled out to last until June 30 then she retires

## 2024-02-26 NOTE — Patient Instructions (Signed)
 Angioedema Angioedema is the sudden swelling of tissue in the body. Angioedema can affect any part of the body, including the legs, hands, genitals, face, mouth, lips, and internal organs, like your intestines. Depending on the cause, angioedema may happen just once. However, some people may have repeated bouts of angioedema during their lives. Symptoms may be mild and may occur along with other allergic symptoms such as itchy, red, swollen areas of skin (hives). Severe angioedema can be life-threatening if it affects the air passages and blocks breathing. What are the causes? This condition may be caused by: Foods, such as milk, eggs, shellfish, wheat, or nuts. Certain medicines, such as ACE inhibitors, birth control pills, dyes used in X-rays, or NSAIDs, such as ibuprofen. Hereditary angioedema (HAE) is genetic. Episodes can be triggered by: Illness, infection, or emotional or physical stress. Changes in hormone levels. Exercise. Minor surgical or dental procedures. In some cases, the cause of this condition is not known. What increases the risk? You are more likely to develop HAE if you have family members with this condition.  What are the signs or symptoms?  Symptoms of this condition depend on where the swelling happens.  Symptoms of this condition include: Swollen skin. Hives. Pain, pressure, or tenderness in the affected area. Swollen eyelids, face, lips, or tongue. Trouble drinking, swallowing, or closing the mouth completely. Hoarseness or sore throat. Wheezing or trouble breathing. If your internal organs are affected, symptoms may also include: Nausea. Pain in the abdomen. Vomiting or diarrhea. Trouble swallowing. Trouble passing urine. How is this diagnosed? This condition may be diagnosed based on: An exam of the affected area. Your medical history. Whether anyone in your family has had this condition before. A review of any medicines you have been taking. Tests,  including: Allergy skin tests to see if the condition was caused by an allergic reaction. Blood tests to see if the condition was caused by certain inherited or genetic diseases. How is this treated? Treatment for this condition depends on the cause and severity of your symptoms. It may involve any of the following: Avoiding triggers, if they are known. Triggers may include foods or environmental allergens. Stopping medicines permanently if they cause the condition. These include ACE inhibitors. Taking medicines to treat symptoms or prevent future episodes. These may include: Antihistamines. Epinephrine injections. Steroids. Blood products to treat specific types of non-allergic angioedema. Breathing tubes or ventilators in severe cases in which breathing is affected. Severe cases of angioedema are treated at the hospital. Mild to moderate angioedema usually gets better in 24-48 hours. Follow these instructions at home:  Take over-the-counter and prescription medicines only as told by your health care provider. If you were given medicines for emergency allergy treatment, always carry them with you. This includes epinephrine injector kits. Wear a medical bracelet as told by your health care provider. If something triggers your condition, avoid the trigger. Triggers can be foods, environmental allergens, stress, or exercise. Avoid all medicines that caused your angioedema. This is for your entire life. If your condition is inherited and you are thinking about having children, talk to your health care provider. It is important to discuss the risks of passing on the condition to your children. Where to find more information American Academy of Allergy Asthma & Immunology: www.aaaai.org Contact a health care provider if: You continue to have repeated episodes of angioedema. Episodes of angioedema start to happen more often than they used to, even after you take steps to prevent them. You have  episodes of angioedema that are more severe than they have been before, even after you take steps to prevent them. You are thinking about having children. Get help right away if: You have severe swelling of your mouth, tongue, or lips. Your swelling gets worse. You have trouble breathing, swallowing, or talking. You have chest pain, dizziness or light-headedness, or you pass out. These symptoms may represent a serious problem that is an emergency. Do not wait to see if the symptoms will go away. Get medical help right away. Call your local emergency services (911 in the [place] Do not drive yourself to the hospital. Summary Angioedema is the sudden swelling of tissues. It is important to be aware of all triggers or causes for your angioedema and to avoid them. Treatment for this condition depends on the cause and severity of your symptoms. Severe angioedema can be life-threatening if it blocks the air passages. This information is not intended to replace advice given to you by your health care provider. Make sure you discuss any questions you have with your health care provider. Document Revised: 03/27/2021 Document Reviewed: 03/27/2021 Elsevier Patient Education  2024 [employer].

## 2024-02-26 NOTE — Progress Notes (Signed)
 Established Patient Office Visit  Subjective   Patient ID: Suzanne Singh, female    DOB: May 17, 1966  Age: 58 y.o. MRN: [id number]  Chief Complaint  Patient presents with   Forms   HPI Discussed the use of AI scribe software for clinical note transcription with the patient, who gave verbal consent to proceed.  History of Present Illness Suzanne Singh is a 58 year old female who presents with facial swelling and difficulty swallowing.  She experienced an allergic reaction this morning, characterized by facial swelling and difficulty swallowing. She took Benadryl and ibuprofen, which reduced the swelling, but it remains present. No new exposures to medications or products were noted, and this is not the first occurrence of such reactions. She has been evaluated by an allergist and is aware of her allergies, which include dust and pollen.  She describes ongoing symptoms including neuropathy, bruising on her leg, swelling, inflammation, heart palpitations, anxiety, ringing in her ears, fatigue, and hives. Some of these symptoms are associated with menopause and long-term effects of COVID-19, which she contracted in 2021. She has undergone various specialist evaluations, including kidney checks and a bladder ablation.  She is currently taking fluoxetine and occasionally uses Benadryl for her allergic reactions.  She mentions a history of surgery on her hand to remove a lump, with another lump currently growing.  She is experiencing significant life changes, including early retirement due to her health issues, and is exploring part-time work options.   Patient Active Problem List   Diagnosis Date Noted   Hx of adenomatous polyp of colon 09/07/2023   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   Acute right ankle pain 05/25/2023   Right foot pain 05/25/2023   Right calf pain 05/25/2023   Acute left-sided low back pain with left-sided sciatica 11/28/2022   Congenital single kidney 06/16/2022   Atypical chest  pain 03/17/2022   Dysphagia 03/17/2022   Whiplash injury to neck 10/03/2021   DDD (degenerative disc disease), cervical 03/21/2021   Palpitations 10/04/2020   Tachycardia 06/14/2020   Shortness of breath on exertion 06/14/2020   Memory loss 06/14/2020   Brain fog 06/14/2020   Benign paroxysmal positional vertigo due to bilateral vestibular disorder 06/14/2020   Tinnitus of both ears 06/14/2020   Physical deconditioning 06/14/2020   Essential hypertension 05/16/2020   Stress headache 05/16/2020   SOB (shortness of breath) 05/16/2020   Immunization reaction 05/16/2020   Neck pain on right side 03/31/2018   Viral URI with cough 04/25/2017   Abscess 12/27/2016   Left anterior shoulder pain 06/28/2015   Skin infection 01/12/2015   Breast pain 01/12/2015   UTI (urinary tract infection) 11/16/2014   Weight gain 11/16/2014   Bilateral knee pain 09/21/2014   Thyromegaly 09/21/2014   Obesity (BMI 30-39.9) 06/21/2014   Edema 04/07/2014   Maxillary sinusitis 11/07/2013   Elevated BP 11/07/2013   Insomnia 07/28/2013   Epigastric pain 12/06/2012   Menopause 12/06/2012   IBS (irritable bowel syndrome) 12/06/2012   Fatigue 12/06/2012   Family hx of colon cancer in grandparent 12/06/2012   Screening for malignant neoplasm of cervix 07/22/2012   Preventative health care 07/22/2012   Depression with anxiety 07/22/2012   Eating disorder 07/22/2012   GERD (gastroesophageal reflux disease) 07/22/2012   Fibromyalgia 07/22/2012   Past Medical History:  Diagnosis Date   Anxiety    Arrhythmia    Arthritis    Depression    Eating disorder    Fibromyalgia    GERD (gastroesophageal reflux disease)  Hx of adenomatous polyp of colon 09/07/2023   Diminutive - on recall 2029 (also has Fhx CRCA)    IBS (irritable bowel syndrome)    Migraine    Seizures (HCC)    last 2008   Past Surgical History:  Procedure Laterality Date   COLONOSCOPY Bilateral 2014   myomectomy Bilateral 04/2021    Social History   Tobacco Use   Smoking status: Never   Smokeless tobacco: Never  Vaping Use   Vaping status: Never Used  Substance Use Topics   Alcohol use: Not Currently    Comment: wine on occasion   Drug use: No   Social History   Socioeconomic History   Marital status: Married    Spouse name: Not on file   Number of children: 2   Years of education: Not on file   Highest education level: Master's degree (e.g., MA, MS, MEng, MEd, MSW, MBA)  Occupational History   Occupation: Runner, broadcasting/film/video  Tobacco Use   Smoking status: Never   Smokeless tobacco: Never  Vaping Use   Vaping status: Never Used  Substance and Sexual Activity   Alcohol use: Not Currently    Comment: wine on occasion   Drug use: No   Sexual activity: Not Currently  Other Topics Concern   Not on file  Social History Narrative   Middle school English teacher, [employer]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   2 sons      Exercise-no   Social Drivers of Health   Financial Resource Strain: Medium Risk (02/25/2024)   Overall Financial Resource Strain (CARDIA)    Difficulty of Paying Living Expenses: Somewhat hard  Food Insecurity: No Food Insecurity (02/25/2024)   Hunger Vital Sign    Worried About Running Out of Food in the Last Year: Never true    Ran Out of Food in the Last Year: Never true  Transportation Needs: No Transportation Needs (02/25/2024)   PRAPARE - Administrator, Civil Service (Medical): No    Lack of Transportation (Non-Medical): No  Physical Activity: Insufficiently Active (02/25/2024)   Exercise Vital Sign    Days of Exercise per Week: 2 days    Minutes of Exercise per Session: 20 min  Stress: Stress Concern Present (02/25/2024)   [employer] of Occupational Health - Occupational Stress Questionnaire    Feeling of Stress : Very much  Social Connections: Socially Integrated (02/25/2024)   Social Connection and Isolation Panel  [NHANES]    Frequency of Communication with Friends and Family: More than three times a week    Frequency of Social Gatherings with Friends and Family: Never    Attends Religious Services: More than 4 times per year    Active Member of [employer] or Organizations: Yes    Attends Banker Meetings: 1 to 4 times per year    Marital Status: Married  Catering manager Violence: Not on file   Family Status  Relation Name Status   Mother  (Not Specified)   Father  (Not Specified)   Sister half Deceased   Mat Aunt  (Not Specified)   MGM  (Not Specified)   MGF  (Not Specified)   PGM  (Not Specified)   PGF  (Not Specified)   Neg Hx  (Not Specified)  No partnership data on file   Family History  Problem Relation Age of Onset   Colon cancer Mother    Alcohol abuse Mother    Arthritis Mother  Lung cancer Mother    Hypertension Mother    Mental illness Mother    Alcohol abuse Father    Heart disease Father    Hypertension Father    Diabetes Father    Colon cancer Father    Early death Sister    Colon cancer Maternal Aunt    Arthritis Maternal Grandmother    Hypertension Maternal Grandmother    Breast cancer Maternal Grandmother    Stroke Maternal Grandfather    Hypertension Maternal Grandfather    Heart disease Paternal Grandmother    Hypertension Paternal Grandmother    Colon cancer Paternal Grandfather    Heart disease Paternal Grandfather    Hypertension Paternal Grandfather    Kidney disease Paternal Grandfather    Diabetes Paternal Grandfather    Stomach cancer Neg Hx    Esophageal cancer Neg Hx    Allergies  Allergen Reactions   Ambien Cr [Zolpidem Tartrate Er]     Seizure    Covid-19 Mrna Vacc (Moderna) Swelling   Cymbalta [Duloxetine Hcl]     Seizure    Celebrex [Celecoxib]     Hives    Hydrocodone Itching   Latex Rash    Adhesive   Mobic [Meloxicam] Palpitations      Review of Systems  Constitutional:  Negative for fever and malaise/fatigue.   HENT:  Positive for hearing loss and tinnitus. Negative for congestion.   Eyes:  Negative for blurred vision.  Respiratory:  Negative for shortness of breath.   Cardiovascular:  Negative for chest pain, palpitations and leg swelling.  Gastrointestinal:  Negative for abdominal pain, blood in stool and nausea.  Genitourinary:  Negative for dysuria and frequency.  Musculoskeletal:  Positive for myalgias. Negative for falls.  Skin:  Negative for rash.  Neurological:  Positive for tingling and weakness. Negative for dizziness, loss of consciousness and headaches.  Endo/Heme/Allergies:  Negative for environmental allergies.  Psychiatric/Behavioral:  Negative for depression. The patient is not nervous/anxious.       Objective:     BP 112/80 (BP Location: Right Arm, Patient Position: Sitting, Cuff Size: Normal)   Pulse 78   Temp 99.1 F (37.3 C) (Oral)   Resp 18   Ht 5\' 8"  (1.727 m)   Wt 191 lb 3.2 oz (86.7 kg)   SpO2 99%   BMI 29.07 kg/m  SpO2 Readings from Last 3 Encounters:  02/26/24 99%  09/25/23 99%  09/09/23 99%      Physical Exam Vitals and nursing note reviewed.  Constitutional:      General: She is not in acute distress.    Appearance: Normal appearance. She is well-developed.  HENT:     Head: Normocephalic and atraumatic.     Mouth/Throat:     Comments: Lips slightly swollen on Left side==== pt has been taking benadryl Eyes:     General: No scleral icterus.       Right eye: No discharge.        Left eye: No discharge.  Cardiovascular:     Rate and Rhythm: Normal rate and regular rhythm.     Heart sounds: No murmur heard. Pulmonary:     Effort: Pulmonary effort is normal. No respiratory distress.     Breath sounds: Normal breath sounds.  Musculoskeletal:        General: Normal range of motion.     Cervical back: Normal range of motion and neck supple.     Right lower leg: No edema.     Left lower  leg: No edema.  Skin:    General: Skin is warm and dry.   Neurological:     Mental Status: She is alert and oriented to person, place, and time.  Psychiatric:        Mood and Affect: Mood normal.        Behavior: Behavior normal.        Thought Content: Thought content normal.        Judgment: Judgment normal.      No results found for any visits on 02/26/24.  Last CBC Lab Results  Component Value Date   WBC 3.5 (L) 09/25/2023   HGB 12.0 09/25/2023   HCT 38.0 09/25/2023   MCV 91.3 09/25/2023   MCH 28.8 09/25/2023   RDW 12.1 09/25/2023   PLT 357 09/25/2023   Last metabolic panel Lab Results  Component Value Date   GLUCOSE 97 03/17/2022   NA 142 03/17/2022   K 3.8 03/17/2022   CL 103 03/17/2022   CO2 32 03/17/2022   BUN 19 03/17/2022   CREATININE 0.81 03/17/2022   GFR 82.42 03/17/2022   CALCIUM 9.9 03/17/2022   PROT 7.1 03/17/2022   ALBUMIN 4.0 03/17/2022   LABGLOB 2.7 06/18/2020   AGRATIO 1.6 06/18/2020   BILITOT 0.3 03/17/2022   ALKPHOS 98 03/17/2022   AST 24 03/17/2022   ALT 15 03/17/2022   Last lipids Lab Results  Component Value Date   CHOL 195 11/28/2021   HDL 59.10 11/28/2021   LDLCALC 125 (H) 11/28/2021   TRIG 55.0 11/28/2021   CHOLHDL 3 11/28/2021   Last hemoglobin A1c No results found for: "HGBA1C" Last thyroid functions Lab Results  Component Value Date   TSH 2.04 03/17/2023   T4TOTAL 9.2 03/17/2023   Last vitamin D Lab Results  Component Value Date   VD25OH 53.56 03/17/2023   Last vitamin B12 and Folate Lab Results  Component Value Date   VITAMINB12 745 03/17/2023      The 10-year ASCVD risk score (Arnett DK, et al., 2019) is: 2.1%    Assessment & Plan:   Problem List Items Addressed This Visit       Unprioritized   Fatigue   Relevant Orders   CBC with Differential/Platelet   Comprehensive metabolic panel   Lipid panel   TSH   Long COVID   Pt still struggling with tinnitus, fatigue, myalgias etc  Fmla filled out to last until June 30 then she retires       Other Visit  Diagnoses       Angioedema, subsequent encounter    -  Primary   Relevant Medications   predniSONE (DELTASONE) 10 MG tablet   methylPREDNISolone acetate (DEPO-MEDROL) injection 80 mg   Other Relevant Orders   CBC with Differential/Platelet   Comprehensive metabolic panel   Lipid panel   TSH     Iron deficiency anemia, unspecified iron deficiency anemia type       Relevant Orders   CBC with Differential/Platelet   Comprehensive metabolic panel   Lipid panel   TSH     Assessment and Plan Assessment & Plan Allergic Reaction   She experienced an acute allergic reaction with facial swelling, dysphagia, and potential airway involvement, likely due to environmental allergens like pollen. Known allergies include dust and pollen. Benadryl and ibuprofen reduced the swelling, but some persists. Administer a steroid injection and prescribe a prednisone tapering pack to be filled at [facility]. Advise continuation of Benadryl or alternative antihistamines like Claritin or Zyrtec. Instruct  her to report if symptoms do not improve.  Long COVID Syndrome   She has persistent symptoms post-COVID-19 infection, including fatigue, tinnitus, neuropathy, palpitations, anxiety, and cognitive impairment since 2021. These symptoms impact her ability to work, leading to early retirement considerations. She is exploring part-time or online work options. Complete disability forms for insurance purposes and schedule a virtual meeting to assist with form completion if needed.  General Health Maintenance   She maintains a healthy lifestyle through diet and walking but experiences significant stress related to her health and employment situation. Encourage continued healthy lifestyle practices and provide support for stress management and coping strategies.    Return if symptoms worsen or fail to improve.    Donato Schultz, DO

## 2024-04-14 ENCOUNTER — Encounter (HOSPITAL_COMMUNITY): Payer: Self-pay

## 2024-04-21 ENCOUNTER — Encounter: Admitting: Family Medicine

## 2024-10-10 ENCOUNTER — Encounter: Payer: Self-pay | Admitting: Radiology
